# Patient Record
Sex: Female | Born: 1940
Health system: Southern US, Community
[De-identification: ages and names within clinical notes are randomized; demographics above are authoritative.]

## PROBLEM LIST (undated history)

## (undated) DIAGNOSIS — I10 Essential (primary) hypertension: Secondary | ICD-10-CM

## (undated) DIAGNOSIS — E039 Hypothyroidism, unspecified: Secondary | ICD-10-CM

## (undated) DIAGNOSIS — I2510 Atherosclerotic heart disease of native coronary artery without angina pectoris: Secondary | ICD-10-CM

## (undated) HISTORY — PX: CORONARY ANGIOPLASTY: SHX604

## (undated) HISTORY — PX: CARDIAC CATHETERIZATION: SHX172

---

## 2007-01-19 ENCOUNTER — Emergency Department: Payer: Self-pay | Admitting: Emergency Medicine

## 2019-08-03 ENCOUNTER — Encounter (HOSPITAL_COMMUNITY): Payer: Self-pay | Admitting: Internal Medicine

## 2019-08-03 ENCOUNTER — Emergency Department (HOSPITAL_COMMUNITY): Payer: Medicaid Other

## 2019-08-03 ENCOUNTER — Observation Stay (HOSPITAL_COMMUNITY)
Admission: EM | Admit: 2019-08-03 | Discharge: 2019-08-04 | Disposition: A | Discharge status: Home / Self Care | Payer: Medicaid Other | Attending: Internal Medicine | Admitting: Internal Medicine

## 2019-08-03 DIAGNOSIS — M545 Low back pain, unspecified: Secondary | ICD-10-CM | POA: Diagnosis present

## 2019-08-03 DIAGNOSIS — E039 Hypothyroidism, unspecified: Secondary | ICD-10-CM | POA: Insufficient documentation

## 2019-08-03 DIAGNOSIS — Y9301 Activity, walking, marching and hiking: Secondary | ICD-10-CM | POA: Diagnosis not present

## 2019-08-03 DIAGNOSIS — D649 Anemia, unspecified: Secondary | ICD-10-CM | POA: Insufficient documentation

## 2019-08-03 DIAGNOSIS — R200 Anesthesia of skin: Secondary | ICD-10-CM | POA: Diagnosis present

## 2019-08-03 DIAGNOSIS — S22000A Wedge compression fracture of unspecified thoracic vertebra, initial encounter for closed fracture: Secondary | ICD-10-CM | POA: Diagnosis present

## 2019-08-03 DIAGNOSIS — M5126 Other intervertebral disc displacement, lumbar region: Secondary | ICD-10-CM | POA: Diagnosis not present

## 2019-08-03 DIAGNOSIS — G8911 Acute pain due to trauma: Secondary | ICD-10-CM | POA: Insufficient documentation

## 2019-08-03 DIAGNOSIS — I10 Essential (primary) hypertension: Secondary | ICD-10-CM | POA: Diagnosis not present

## 2019-08-03 DIAGNOSIS — Z20822 Contact with and (suspected) exposure to covid-19: Secondary | ICD-10-CM | POA: Diagnosis not present

## 2019-08-03 DIAGNOSIS — M5124 Other intervertebral disc displacement, thoracic region: Secondary | ICD-10-CM | POA: Diagnosis not present

## 2019-08-03 DIAGNOSIS — W19XXXA Unspecified fall, initial encounter: Secondary | ICD-10-CM | POA: Diagnosis not present

## 2019-08-03 DIAGNOSIS — I700 Atherosclerosis of aorta: Secondary | ICD-10-CM | POA: Diagnosis not present

## 2019-08-03 DIAGNOSIS — S22078A Other fracture of T9-T10 vertebra, initial encounter for closed fracture: Secondary | ICD-10-CM | POA: Diagnosis not present

## 2019-08-03 DIAGNOSIS — R739 Hyperglycemia, unspecified: Secondary | ICD-10-CM | POA: Insufficient documentation

## 2019-08-03 DIAGNOSIS — Z955 Presence of coronary angioplasty implant and graft: Secondary | ICD-10-CM | POA: Insufficient documentation

## 2019-08-03 DIAGNOSIS — N189 Chronic kidney disease, unspecified: Secondary | ICD-10-CM | POA: Diagnosis present

## 2019-08-03 DIAGNOSIS — I2510 Atherosclerotic heart disease of native coronary artery without angina pectoris: Secondary | ICD-10-CM | POA: Diagnosis not present

## 2019-08-03 DIAGNOSIS — M48061 Spinal stenosis, lumbar region without neurogenic claudication: Secondary | ICD-10-CM | POA: Diagnosis not present

## 2019-08-03 HISTORY — DX: Hypothyroidism, unspecified: E03.9

## 2019-08-03 HISTORY — DX: Essential (primary) hypertension: I10

## 2019-08-03 HISTORY — DX: Atherosclerotic heart disease of native coronary artery without angina pectoris: I25.10

## 2019-08-03 LAB — COMPREHENSIVE METABOLIC PANEL
ALT: 22 U/L (ref 0–44)
AST: 22 U/L (ref 15–41)
Albumin: 3.7 g/dL (ref 3.5–5.0)
Alkaline Phosphatase: 77 U/L (ref 38–126)
Anion gap: 10 (ref 5–15)
BUN: 20 mg/dL (ref 8–23)
CO2: 25 mmol/L (ref 22–32)
Calcium: 9.4 mg/dL (ref 8.9–10.3)
Chloride: 106 mmol/L (ref 98–111)
Creatinine, Ser: 1.26 mg/dL — ABNORMAL HIGH (ref 0.44–1.00)
GFR calc Af Amer: 47 mL/min — ABNORMAL LOW (ref >60)
GFR calc non Af Amer: 40 mL/min — ABNORMAL LOW (ref >60)
Glucose, Bld: 182 mg/dL — ABNORMAL HIGH (ref 70–99)
Potassium: 3.7 mmol/L (ref 3.5–5.1)
Sodium: 141 mmol/L (ref 135–145)
Total Bilirubin: 0.6 mg/dL (ref 0.3–1.2)
Total Protein: 7.5 g/dL (ref 6.5–8.1)

## 2019-08-03 LAB — CBC
HCT: 33.8 % — ABNORMAL LOW (ref 36.0–46.0)
Hemoglobin: 11 g/dL — ABNORMAL LOW (ref 12.0–15.0)
MCH: 30.6 pg (ref 26.0–34.0)
MCHC: 32.5 g/dL (ref 30.0–36.0)
MCV: 94.2 fL (ref 80.0–100.0)
Platelets: 193 10*3/uL (ref 150–400)
RBC: 3.59 MIL/uL — ABNORMAL LOW (ref 3.87–5.11)
RDW: 11.9 % (ref 11.5–15.5)
WBC: 7.5 10*3/uL (ref 4.0–10.5)
nRBC: 0 % (ref 0.0–0.2)

## 2019-08-03 MED ORDER — LIDOCAINE 5 % EX PTCH
1.0000 | MEDICATED_PATCH | CUTANEOUS | Status: DC
  Administered 2019-08-03: 1 via TRANSDERMAL
  Filled 2019-08-03: qty 1

## 2019-08-03 MED ORDER — ENOXAPARIN SODIUM 40 MG/0.4ML ~~LOC~~ SOLN
40.0000 mg | Freq: Every day | SUBCUTANEOUS | Status: DC
  Administered 2019-08-04: 40 mg via SUBCUTANEOUS
  Filled 2019-08-03: qty 0.4

## 2019-08-03 MED ORDER — ONDANSETRON HCL 4 MG/2ML IJ SOLN: 4.0000 mg | Freq: Four times a day (QID) | INTRAMUSCULAR | Status: DC | PRN

## 2019-08-03 MED ORDER — ONDANSETRON HCL 4 MG PO TABS: 4.0000 mg | ORAL_TABLET | Freq: Four times a day (QID) | ORAL | Status: DC | PRN

## 2019-08-03 MED ORDER — HYDROCODONE-ACETAMINOPHEN 5-325 MG PO TABS
1.0000 | ORAL_TABLET | Freq: Once | ORAL | Status: AC
  Administered 2019-08-03: 1 via ORAL
  Filled 2019-08-03: qty 1

## 2019-08-03 MED ORDER — METHOCARBAMOL 500 MG PO TABS
500.0000 mg | ORAL_TABLET | Freq: Once | ORAL | Status: AC
  Administered 2019-08-03: 500 mg via ORAL
  Filled 2019-08-03: qty 1

## 2019-08-03 MED ORDER — HYDRALAZINE HCL 20 MG/ML IJ SOLN: 10.0000 mg | INTRAMUSCULAR | Status: DC | PRN

## 2019-08-03 NOTE — H&P (Signed)
History and Physical    Danielle Houston QMV:[id number] DOB: 1941-01-20 DOA: 08/03/2019  PCP: Patient, No Pcp Per  Patient coming from: Home.  Patient speaks [place] language of [place].  Translator used.  Chief Complaint: Low back pain.  HPI: Danielle Houston is a 79 y.o. female with history of CAD hypertension hypothyroidism had a fall at home when she was walking and slipped and fell down to the floor and hit the back.  In the process she did hit her head did not lose consciousness.  Since then she has been having low back pain.  Denies any incontinence of urine or difficulty moving extremities.  ED Course: In the ER patient was able to move all extremities but since patient was complaining significant pain in the low back MRI was done of the L-spine and T-spine which shows some mild canal stenosis.  Patient admitted for further observation.  Labs show hemoglobin of 11 no old labs to compare blood glucose 182.  Creatinine 1.2.  Medication list has to be updated.  Covid test pending.  Review of Systems: As per HPI, rest all negative.   Past Medical History:  Diagnosis Date  . Coronary artery disease   . Hypertension   . Hypothyroidism     Past Surgical History:  Procedure Laterality Date  . CARDIAC CATHETERIZATION    . CORONARY ANGIOPLASTY       reports that she has never smoked. She has never used smokeless tobacco. She reports that she does not drink alcohol or use drugs.  Not on File  Family History  Problem Relation Age of Onset  . CAD Neg Hx     Prior to Admission medications   Not on File    Physical Exam: Constitutional: Moderately built and nourished. Vitals:   08/03/19 1126 08/03/19 1944  BP: (!) 166/69 (!) 190/72  Pulse: 68 69  Resp: 14 15  Temp: 98.4 F (36.9 C)   TempSrc: Oral   SpO2: 100% 98%   Eyes: Anicteric no pallor. ENMT: No discharge from the ears eyes nose or mouth. Neck: No mass.  No neck rigidity. Respiratory: No rhonchi or  crepitations. Cardiovascular: S1-S2 heard. Abdomen: Soft nontender bowel sounds present. Musculoskeletal: No edema. Skin: No rash. Neurologic: Alert awake oriented to time place and person.  Moves all extremities. Psychiatric: Appears normal but normal affect.   Labs on Admission: I have personally reviewed following labs and imaging studies  CBC: Recent Labs  Lab 08/03/19 1950  WBC 7.5  HGB 11.0*  HCT 33.8*  MCV 94.2  PLT 284   Basic Metabolic Panel: Recent Labs  Lab 08/03/19 1950  NA 141  K 3.7  CL 106  CO2 25  GLUCOSE 182*  BUN 20  CREATININE 1.26*  CALCIUM 9.4   GFR: CrCl cannot be calculated (Unknown ideal weight.). Liver Function Tests: Recent Labs  Lab 08/03/19 1950  AST 22  ALT 22  ALKPHOS 77  BILITOT 0.6  PROT 7.5  ALBUMIN 3.7   No results for input(s): LIPASE, AMYLASE in the last 168 hours. No results for input(s): AMMONIA in the last 168 hours. Coagulation Profile: No results for input(s): INR, PROTIME in the last 168 hours. Cardiac Enzymes: No results for input(s): CKTOTAL, CKMB, CKMBINDEX, TROPONINI in the last 168 hours. BNP (last 3 results) No results for input(s): PROBNP in the last 8760 hours. HbA1C: No results for input(s): HGBA1C in the last 72 hours. CBG: No results for input(s): GLUCAP in the last 168 hours. Lipid  Profile: No results for input(s): CHOL, HDL, LDLCALC, TRIG, CHOLHDL, LDLDIRECT in the last 72 hours. Thyroid Function Tests: No results for input(s): TSH, T4TOTAL, FREET4, T3FREE, THYROIDAB in the last 72 hours. Anemia Panel: No results for input(s): VITAMINB12, FOLATE, FERRITIN, TIBC, IRON, RETICCTPCT in the last 72 hours. Urine analysis: No results found for: COLORURINE, APPEARANCEUR, LABSPEC, PHURINE, GLUCOSEU, HGBUR, BILIRUBINUR, KETONESUR, PROTEINUR, UROBILINOGEN, NITRITE, LEUKOCYTESUR Sepsis Labs: @LABRCNTIP (procalcitonin:4,lacticidven:4) )No results found for this or any previous visit (from the past 240  hour(s)).   Radiological Exams on Admission: DG Thoracic Spine 2 View  Result Date: 08/03/2019 CLINICAL DATA:  Mid and low back pain since a fall 08/01/2019. Initial encounter. EXAM: THORACIC SPINE 2 VIEWS COMPARISON:  Plain films lumbar spine this same day. FINDINGS: T9 compression fracture is seen as on comparison plain films of the lumbar spine today. No other fracture is identified. No worrisome bony lesion. Atherosclerosis noted. IMPRESSION: Age-indeterminate T9 compression fracture as seen on plain films of the lumbar spine this same day. No other fracture is identified. Aortic Atherosclerosis (ICD10-I70.0). Electronically Signed   By: Inge Rise M.D.   On: 08/03/2019 12:40   DG Lumbar Spine Complete  Result Date: 08/03/2019 CLINICAL DATA:  Mid and low back pain since a fall 08/01/2019. Initial encounter. EXAM: LUMBAR SPINE - COMPLETE 4+ VIEW COMPARISON:  None. FINDINGS: The patient has a biconcave compression fracture of T9 with vertebral body height loss of up to approximately 50%. The fracture is age indeterminate. No bony retropulsion is identified. No other fracture is seen. Intervertebral disc space height is maintained throughout. Paraspinous structures are unremarkable. IMPRESSION: T9 compression fracture with vertebral body height loss of up to 50% is age indeterminate. The exam is otherwise negative. Electronically Signed   By: Inge Rise M.D.   On: 08/03/2019 12:39   MR THORACIC SPINE WO CONTRAST  Result Date: 08/03/2019 CLINICAL DATA:  Initial evaluation for acute trauma, fall, compression fracture. EXAM: MRI THORACIC AND LUMBAR SPINE WITHOUT CONTRAST TECHNIQUE: Multiplanar and multiecho pulse sequences of the thoracic and lumbar spine were obtained without intravenous contrast. COMPARISON:  Prior radiograph from earlier the same day. FINDINGS: MRI THORACIC SPINE FINDINGS Alignment: Trace scoliosis. Alignment otherwise normal with preservation of the normal thoracic  kyphosis. No listhesis. Vertebrae: Acute to subacute compression fracture involving the T9 vertebral body with up to 35% height loss without bony retropulsion. This is benign/mechanical in appearance. Otherwise, vertebral body height maintained without evidence for acute or chronic fracture. Bone marrow signal intensity within normal limits. Few scattered benign hemangiomata noted. No worrisome osseous lesions. No other abnormal marrow edema. Cord:  Signal intensity within the thoracic spinal cord is normal. Paraspinal and other soft tissues: Mild paraspinous edema adjacent to the T9 compression fracture. Paraspinous soft tissues otherwise within normal limits. Partially visualized lungs are clear. Visualized visceral structures unremarkable. Disc levels: T1-2:  Unremarkable. T2-3: Normal interspace. Right-sided facet hypertrophy. No stenosis. T3-4: Normal interspace. Right-sided facet hypertrophy. No stenosis. T4-5: Minimal disc bulge. Right greater than left facet hypertrophy. No stenosis. T5-6: Right paracentral disc protrusion indents the right ventral thecal sac. No significant spinal stenosis or cord deformity. Foramina remain patent. T6-7: Left paracentral disc protrusion indents the left ventral thecal sac. No significant stenosis or cord deformity. Foramina remain patent. T7-8: Negative interspace. Posterior element hypertrophy. No stenosis. T8-9: Left paracentral disc protrusion indents the left ventral thecal sac. Minimal cord flattening without significant spinal stenosis. Superimposed posterior element hypertrophy. Foramina remain patent. T9-10: Central disc protrusion indents the ventral  thecal sac. No significant spinal stenosis or cord deformity. Foramina remain patent. T10-11:  Negative interspace. Mild facet hypertrophy. No stenosis. T11-12: Disc desiccation with minimal disc bulge. Mild facet hypertrophy. No stenosis. T12-L1:  Unremarkable. MRI LUMBAR SPINE FINDINGS Segmentation: Standard. Lowest  well-formed disc space labeled the L5-S1 level. Alignment: Mild straightening of the normal lumbar lordosis. Trace 2 mm anterolisthesis of L4 on L5, chronic and facet mediated. Vertebrae: Vertebral body height maintained without evidence for acute or chronic fracture. Bone marrow signal intensity within normal limits. No discrete or worrisome osseous lesions. No abnormal marrow edema. Conus medullaris and cauda equina: Conus extends to the L1-2 level. Conus and cauda equina appear normal. Paraspinal and other soft tissues: Paraspinous soft tissues within normal limits. Partially visualized urinary bladder moderately distended. Visualized visceral structures otherwise unremarkable. Disc levels: L1-2: Disc desiccation with mild disc bulge. Superimposed small central disc protrusion mildly indents the ventral thecal sac. No significant spinal stenosis. Foramina remain patent. L2-3:  Unremarkable. L3-4: Disc desiccation with mild disc bulge. Anterior annular fissure noted. Superimposed shallow left foraminal disc protrusion closely approximates the exiting left L3 nerve root (series 7, image 24). Mild facet hypertrophy. No significant spinal stenosis. Mild left greater than right L3 foraminal narrowing. L4-5: Trace anterolisthesis. Disc desiccation with mild disc bulge. Prominent anterior annular fissure. Superimposed central disc protrusion with associated superior migration. Mild bilateral facet hypertrophy. Resultant mild canal with left greater than right lateral recess stenosis. Mild left greater than right L4 foraminal narrowing. L5-S1: Disc desiccation with mild disc bulge. Mild bilateral facet hypertrophy. No significant spinal stenosis. Lateral recesses remain patent. No significant foraminal narrowing. IMPRESSION: MR THORACIC SPINE IMPRESSION 1. Acute to subacute compression fracture involving the T9 vertebral body with up to 35% height loss without bony retropulsion. No stenosis. 2. Multilevel degenerative  spondylosis with small disc protrusions at T5-6, T6-7, T8-9, and T9-10 as above. No significant stenosis. MR LUMBAR SPINE IMPRESSION 1. No acute traumatic injury within the lumbar spine. 2. Disc bulge with small central disc protrusion and facet hypertrophy at L4-5 with resultant mild canal with mild bilateral lateral recess stenosis, with mild bilateral L4 foraminal narrowing, greater on the left. 3. Shallow left foraminal disc protrusion at L3-4, closely approximating and potentially affecting the exiting left L3 nerve root. Electronically Signed   By: Jeannine Boga M.D.   On: 08/03/2019 21:38   MR LUMBAR SPINE WO CONTRAST  Result Date: 08/03/2019 CLINICAL DATA:  Initial evaluation for acute trauma, fall, compression fracture. EXAM: MRI THORACIC AND LUMBAR SPINE WITHOUT CONTRAST TECHNIQUE: Multiplanar and multiecho pulse sequences of the thoracic and lumbar spine were obtained without intravenous contrast. COMPARISON:  Prior radiograph from earlier the same day. FINDINGS: MRI THORACIC SPINE FINDINGS Alignment: Trace scoliosis. Alignment otherwise normal with preservation of the normal thoracic kyphosis. No listhesis. Vertebrae: Acute to subacute compression fracture involving the T9 vertebral body with up to 35% height loss without bony retropulsion. This is benign/mechanical in appearance. Otherwise, vertebral body height maintained without evidence for acute or chronic fracture. Bone marrow signal intensity within normal limits. Few scattered benign hemangiomata noted. No worrisome osseous lesions. No other abnormal marrow edema. Cord:  Signal intensity within the thoracic spinal cord is normal. Paraspinal and other soft tissues: Mild paraspinous edema adjacent to the T9 compression fracture. Paraspinous soft tissues otherwise within normal limits. Partially visualized lungs are clear. Visualized visceral structures unremarkable. Disc levels: T1-2:  Unremarkable. T2-3: Normal interspace. Right-sided  facet hypertrophy. No stenosis. T3-4: Normal interspace. Right-sided facet  hypertrophy. No stenosis. T4-5: Minimal disc bulge. Right greater than left facet hypertrophy. No stenosis. T5-6: Right paracentral disc protrusion indents the right ventral thecal sac. No significant spinal stenosis or cord deformity. Foramina remain patent. T6-7: Left paracentral disc protrusion indents the left ventral thecal sac. No significant stenosis or cord deformity. Foramina remain patent. T7-8: Negative interspace. Posterior element hypertrophy. No stenosis. T8-9: Left paracentral disc protrusion indents the left ventral thecal sac. Minimal cord flattening without significant spinal stenosis. Superimposed posterior element hypertrophy. Foramina remain patent. T9-10: Central disc protrusion indents the ventral thecal sac. No significant spinal stenosis or cord deformity. Foramina remain patent. T10-11:  Negative interspace. Mild facet hypertrophy. No stenosis. T11-12: Disc desiccation with minimal disc bulge. Mild facet hypertrophy. No stenosis. T12-L1:  Unremarkable. MRI LUMBAR SPINE FINDINGS Segmentation: Standard. Lowest well-formed disc space labeled the L5-S1 level. Alignment: Mild straightening of the normal lumbar lordosis. Trace 2 mm anterolisthesis of L4 on L5, chronic and facet mediated. Vertebrae: Vertebral body height maintained without evidence for acute or chronic fracture. Bone marrow signal intensity within normal limits. No discrete or worrisome osseous lesions. No abnormal marrow edema. Conus medullaris and cauda equina: Conus extends to the L1-2 level. Conus and cauda equina appear normal. Paraspinal and other soft tissues: Paraspinous soft tissues within normal limits. Partially visualized urinary bladder moderately distended. Visualized visceral structures otherwise unremarkable. Disc levels: L1-2: Disc desiccation with mild disc bulge. Superimposed small central disc protrusion mildly indents the ventral thecal  sac. No significant spinal stenosis. Foramina remain patent. L2-3:  Unremarkable. L3-4: Disc desiccation with mild disc bulge. Anterior annular fissure noted. Superimposed shallow left foraminal disc protrusion closely approximates the exiting left L3 nerve root (series 7, image 24). Mild facet hypertrophy. No significant spinal stenosis. Mild left greater than right L3 foraminal narrowing. L4-5: Trace anterolisthesis. Disc desiccation with mild disc bulge. Prominent anterior annular fissure. Superimposed central disc protrusion with associated superior migration. Mild bilateral facet hypertrophy. Resultant mild canal with left greater than right lateral recess stenosis. Mild left greater than right L4 foraminal narrowing. L5-S1: Disc desiccation with mild disc bulge. Mild bilateral facet hypertrophy. No significant spinal stenosis. Lateral recesses remain patent. No significant foraminal narrowing. IMPRESSION: MR THORACIC SPINE IMPRESSION 1. Acute to subacute compression fracture involving the T9 vertebral body with up to 35% height loss without bony retropulsion. No stenosis. 2. 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 No significant stenosis. MR LUMBAR SPINE IMPRESSION 1. No acute traumatic injury within the lumbar spine. 2. Disc bulge with small central disc protrusion and facet hypertrophy at L4-5 with resultant mild canal with mild bilateral lateral recess stenosis, with mild bilateral L4 foraminal narrowing, greater on the left. 3. Shallow left foraminal disc protrusion at L3-4, closely approximating and potentially affecting the exiting left L3 nerve root. Electronically Signed   By: Jeannine Boga M.D.   On: 08/03/2019 21:38     Assessment/Plan Principal Problem:   Low back pain Active Problems:   CAD (coronary artery disease)   Anemia   Hypothyroidism    1. Low back pain after fall MRI of the T-spine L-spine does show mild  canal stenosis of the lumbar area.  Will get physical therapy consult. 2. History of CAD hypertension and hypothyroidism medication list is not available at this time.  Will need to continue once available.  For now I have placed patient on as needed IV hydralazine for blood pressure. 3. Normocytic normochromic anemia no old  labs to compare. 4. Hyperglycemia check hemoglobin A1c with next blood draw.  Covid test is pending.   DVT prophylaxis: Lovenox. Code Status: Full code. Family Communication: Patient's son was in the ER earlier with whom ER physician discussed. Disposition Plan: Home. Consults called: Physical therapy. Admission status: Observation.   Rise Patience MD [facility] Pager [phone].  If 7PM-7AM, please contact night-coverage [email or web address] Password [facility]  08/03/2019, 11:54 PM

## 2019-08-03 NOTE — ED Notes (Signed)
Pt transported to MRI 

## 2019-08-03 NOTE — ED Triage Notes (Signed)
Pt here from home with c/o mid and lower back pain after a fall she had on Monday night , did not hit her head , no loc

## 2019-08-03 NOTE — ED Provider Notes (Signed)
[facility] Emergency Department Provider Note MRN:  [id number]  Arrival date & time: 08/03/19     Chief Complaint   Back pain History of Present Illness   Danielle Houston is a 79 y.o. year-old female with a history of hypothyroidism, CAD presenting to the ED with chief complaint of back pain.  History obtained from patient's son via Mandarin interpreter.  Patient was sitting on the edge of the bed and slipped off and fell to the ground.  No head trauma, no loss of consciousness.  Has been having back pain since the fall.  Was walking the day of the injury but yesterday began having great deal ambulating due to the pain.  Also endorsing decreased sensation to the left leg.  Denies weakness, no bowel or bladder dysfunction, no other complaints.  Pain is moderate to severe, constant, worse with motion or palpation.  Review of Systems  A complete 10 system review of systems was obtained and all systems are negative except as noted in the HPI and PMH.   Patient's Health History    Past medical history: Hypertension, hypothyroidism, CAD status post stent placement Social history: Non-smoker   No family history on file.  Social History   Socioeconomic History  . Marital status: Single    Spouse name: Not on file  . Number of children: Not on file  . Years of education: Not on file  . Highest education level: Not on file  Occupational History  . Not on file  Tobacco Use  . Smoking status: Not on file  Substance and Sexual Activity  . Alcohol use: Not on file  . Drug use: Not on file  . Sexual activity: Not on file  Other Topics Concern  . Not on file  Social History Narrative  . Not on file   Social Determinants of Health   Financial Resource Strain:   . Difficulty of Paying Living Expenses:   Food Insecurity:   . Worried About Charity fundraiser in the Last Year:   . Arboriculturist in the Last Year:   Transportation Needs:   . Film/video editor  (Medical):   Marland Kitchen Lack of Transportation (Non-Medical):   Physical Activity:   . Days of Exercise per Week:   . Minutes of Exercise per Session:   Stress:   . Feeling of Stress :   Social Connections:   . Frequency of Communication with Friends and Family:   . Frequency of Social Gatherings with Friends and Family:   . Attends Religious Services:   . Active Member of Clubs or Organizations:   . Attends Archivist Meetings:   Marland Kitchen Marital Status:   Intimate Partner Violence:   . Fear of Current or Ex-Partner:   . Emotionally Abused:   Marland Kitchen Physically Abused:   . Sexually Abused:      Physical Exam   Vitals:   08/03/19 1126 08/03/19 1944  BP: (!) 166/69 (!) 190/72  Pulse: 68 69  Resp: 14 15  Temp: 98.4 F (36.9 C)   SpO2: 100% 98%    CONSTITUTIONAL: Well-appearing, NAD NEURO:  Alert and oriented x 3, normal and symmetric strength, subjective decreased sensation to left leg EYES:  eyes equal and reactive ENT/NECK:  no LAD, no JVD CARDIO: Regular rate, well-perfused, normal S1 and S2 PULM:  CTAB no wheezing or rhonchi GI/GU:  normal bowel sounds, non-distended, non-tender MSK/SPINE:  No gross deformities, no edema SKIN:  no rash, atraumatic  PSYCH:  Appropriate speech and behavior  *Additional and/or pertinent findings included in MDM below  Diagnostic and Interventional Summary    EKG Interpretation  Date/Time:    Ventricular Rate:    PR Interval:    QRS Duration:   QT Interval:    QTC Calculation:   R Axis:     Text Interpretation:        Labs Reviewed  CBC - Abnormal; Notable for the following components:      Result Value   RBC 3.59 (*)    Hemoglobin 11.0 (*)    HCT 33.8 (*)    All other components within normal limits  COMPREHENSIVE METABOLIC PANEL - Abnormal; Notable for the following components:   Glucose, Bld 182 (*)    Creatinine, Ser 1.26 (*)    GFR calc non Af Amer 40 (*)    GFR calc Af Amer 47 (*)    All other components within normal  limits  SARS CORONAVIRUS 2 BY RT PCR (HOSPITAL ORDER, [facility])    MR LUMBAR SPINE WO CONTRAST  Final Result    MR THORACIC SPINE WO CONTRAST  Final Result    DG Lumbar Spine Complete  Final Result    DG Thoracic Spine 2 View  Final Result      Medications  lidocaine (LIDODERM) 5 % 1 patch (has no administration in time range)  methocarbamol (ROBAXIN) tablet 500 mg (has no administration in time range)  HYDROcodone-acetaminophen (NORCO/VICODIN) 5-325 MG per tablet 1 tablet (1 tablet Oral Given 08/03/19 1940)     Procedures  /  Critical Care Procedures  ED Course and Medical Decision Making  I have reviewed the triage vital signs, the nursing notes, and pertinent available records from the EMR.  Listed above are laboratory and imaging tests that I personally ordered, reviewed, and interpreted and then considered in my medical decision making (see below for details).      X-rays reveal compression fracture with 50% height loss, age indeterminant.  Given that this injury location correlates with the location of the patient's pain and the possible neurological deficit, will obtain MRI imaging to exclude retropulsion or myelopathy.  MRI confirms acute or subacute compression fracture, also suggestive of L4 nerve root compression on the left which would explain the numbness.  Patient continues to have a lot of pain and is unable to walk, would not have very much support at home.  Also considering the fact that she is not established with a doctor in the area, all of her home medications at bedside are in Mandarin, son explains that they would have no way of calling an office number in speaking to the secretary in [place], they do not know anybody that speaks English that could do this for them.  For pain control and helping to establish primary care follow-up and spinal specialist follow-up, will admit to hospitalist service.  Barth Kirks. Sedonia Small, MD [place] [email or web address]  Final Clinical Impressions(s) / ED Diagnoses     ICD-10-CM   1. Compression fracture of body of thoracic vertebra (HCC)  S22.000A   2. Numbness in left leg  R20.0     ED Discharge Orders    None       Discharge Instructions Discussed with and Provided to Patient:   Discharge Instructions   None       Maudie Flakes, MD 08/03/19 2227

## 2019-08-04 DIAGNOSIS — M545 Low back pain: Secondary | ICD-10-CM | POA: Diagnosis not present

## 2019-08-04 LAB — IRON AND TIBC
Iron: 47 ug/dL (ref 28–170)
Saturation Ratios: 18 % (ref 10.4–31.8)
TIBC: 265 ug/dL (ref 250–450)
UIBC: 218 ug/dL

## 2019-08-04 LAB — RETICULOCYTES
Immature Retic Fract: 7.9 % (ref 2.3–15.9)
RBC.: 3.41 MIL/uL — ABNORMAL LOW (ref 3.87–5.11)
Retic Count, Absolute: 44 10*3/uL (ref 19.0–186.0)
Retic Ct Pct: 1.3 % (ref 0.4–3.1)

## 2019-08-04 LAB — CBC
HCT: 32.4 % — ABNORMAL LOW (ref 36.0–46.0)
Hemoglobin: 10.8 g/dL — ABNORMAL LOW (ref 12.0–15.0)
MCH: 31.3 pg (ref 26.0–34.0)
MCHC: 33.3 g/dL (ref 30.0–36.0)
MCV: 93.9 fL (ref 80.0–100.0)
Platelets: 191 10*3/uL (ref 150–400)
RBC: 3.45 MIL/uL — ABNORMAL LOW (ref 3.87–5.11)
RDW: 11.9 % (ref 11.5–15.5)
WBC: 7.4 10*3/uL (ref 4.0–10.5)
nRBC: 0 % (ref 0.0–0.2)

## 2019-08-04 LAB — BASIC METABOLIC PANEL
Anion gap: 8 (ref 5–15)
BUN: 17 mg/dL (ref 8–23)
CO2: 28 mmol/L (ref 22–32)
Calcium: 9.1 mg/dL (ref 8.9–10.3)
Chloride: 106 mmol/L (ref 98–111)
Creatinine, Ser: 1.16 mg/dL — ABNORMAL HIGH (ref 0.44–1.00)
GFR calc Af Amer: 52 mL/min — ABNORMAL LOW (ref >60)
GFR calc non Af Amer: 45 mL/min — ABNORMAL LOW (ref >60)
Glucose, Bld: 103 mg/dL — ABNORMAL HIGH (ref 70–99)
Potassium: 4 mmol/L (ref 3.5–5.1)
Sodium: 142 mmol/L (ref 135–145)

## 2019-08-04 LAB — HEMOGLOBIN A1C
Hgb A1c MFr Bld: 5.9 % — ABNORMAL HIGH (ref 4.8–5.6)
Mean Plasma Glucose: 122.63 mg/dL

## 2019-08-04 LAB — FERRITIN: Ferritin: 135 ng/mL (ref 11–307)

## 2019-08-04 LAB — VITAMIN B12: Vitamin B-12: 280 pg/mL (ref 180–914)

## 2019-08-04 LAB — FOLATE: Folate: 36 ng/mL (ref >5.9)

## 2019-08-04 MED ORDER — ACETAMINOPHEN 500 MG PO TABS: 1000.0000 mg | ORAL_TABLET | Freq: Three times a day (TID) | ORAL | Status: DC

## 2019-08-04 MED ORDER — ACETAMINOPHEN 500 MG PO TABS: 1000.0000 mg | ORAL_TABLET | Freq: Three times a day (TID) | ORAL | 0 refills | Status: AC

## 2019-08-04 MED ORDER — DICLOFENAC SODIUM 1 % EX GEL
2.0000 g | Freq: Four times a day (QID) | CUTANEOUS | Status: DC
  Administered 2019-08-04 (×2): 2 g via TOPICAL
  Filled 2019-08-04: qty 100

## 2019-08-04 MED ORDER — DICLOFENAC SODIUM 1 % EX GEL: 2.0000 g | Freq: Four times a day (QID) | CUTANEOUS | 0 refills | Status: AC

## 2019-08-04 MED ORDER — HYDRALAZINE HCL 25 MG PO TABS: 25.0000 mg | ORAL_TABLET | Freq: Three times a day (TID) | ORAL | 0 refills | Status: DC

## 2019-08-04 MED ORDER — METHOCARBAMOL 500 MG PO TABS
500.0000 mg | ORAL_TABLET | Freq: Three times a day (TID) | ORAL | Status: DC
  Administered 2019-08-04: 500 mg via ORAL
  Filled 2019-08-04: qty 1

## 2019-08-04 MED ORDER — ACETAMINOPHEN 500 MG PO TABS
1000.0000 mg | ORAL_TABLET | Freq: Three times a day (TID) | ORAL | Status: DC
  Administered 2019-08-04: 1000 mg via ORAL
  Filled 2019-08-04: qty 2

## 2019-08-04 NOTE — Progress Notes (Signed)
Patient has been up to the bathroom with nursing.  MRIs re-assuring.  Pain controlled.  Hope for d/c later today. JV

## 2019-08-04 NOTE — Evaluation (Signed)
Physical Therapy Evaluation Patient Details Name: Danielle Houston MRN: [id number] DOB: March 30, 1940 Today's Date: 08/04/2019   History of Present Illness  Pt is a 79 yo female presenting after a fall at home where she hit her back and head but did not lose consciousness. Upon arrival, imaging reveales acute or subacute compression fx of L4 with nerve root compression. PMH in cludes: CAD, and HTN.  Clinical Impression  Pt OOB ambulating in room upon arrival of PT, although the pt ambulates slowly, she demonstrates good functional stability with ambulation and transfers throughout session. Despite multiple attempts at interpreter use, I was unable to find an interpreter that speaks the pt's dialect, so we do not know much about PLF or home set up. I discussed back precautions for comfort with the pt's son and feel that returning home with supervision and assist of family will be the safest d/c plan for the pt. The pt's family are in agreement and report the pt seems to be moving at her baseline level of function. No acute PT needs identified at this time, all education was completed with the family. Thank you for the consult.     Follow Up Recommendations No PT follow up;Supervision/Assistance - 24 hour    Equipment Recommendations  None recommended by PT    Recommendations for Other Services       Precautions / Restrictions Precautions Precautions: Fall;Back Precaution Booklet Issued: No Precaution Comments: back for comfort, pt does not speak english, attempted to demonstrate precautions Restrictions Weight Bearing Restrictions: No      Mobility  Bed Mobility Overal bed mobility: Independent             General bed mobility comments: pt OOB walking in room upon arrival of PT, able to get herself into/out of bed  Transfers Overall transfer level: Independent               General transfer comment: pt OOB walking in room upon arrival of PT, able to stand/sit from [facility] without  assist or cues, able to sit to return to bed without assist or instabiltiy  Ambulation/Gait Ambulation/Gait assistance: Supervision Gait Distance (Feet): 15 Feet Assistive device: None Gait Pattern/deviations: WFL(Within Functional Limits)   Gait velocity interpretation: <1.31 ft/sec, indicative of household ambulator General Gait Details: pt with slowed gait, but functional, good stability  Stairs            Wheelchair Mobility    Modified Rankin (Stroke Patients Only)       Balance Overall balance assessment: No apparent balance deficits (not formally assessed)                                           Pertinent Vitals/Pain Pain Assessment: Faces Faces Pain Scale: Hurts a little bit Pain Location: back Pain Descriptors / Indicators: Grimacing;Sore Pain Intervention(s): Limited activity within patient's tolerance;Repositioned    Home Living Family/patient expects to be discharged to:: Unsure                 Additional Comments: Pt from home with family who can assist as needed, pt does not speak [place] or any languages offered on interpreter service. After brief discussion with son, pt is able to live on 1st floor, has 3 STE, but has not have difficulty with steps PTA.    Prior Function Level of Independence: Needs assistance   Gait / Transfers  Assistance Needed: son provides some supervision, reports she walks "slowly"  ADL's / Homemaking Assistance Needed: family assists with IADLS, pt compeltes ADLs        Hand Dominance        Extremity/Trunk Assessment   Upper Extremity Assessment Upper Extremity Assessment: Overall WFL for tasks assessed    Lower Extremity Assessment Lower Extremity Assessment: Overall WFL for tasks assessed    Cervical / Trunk Assessment Cervical / Trunk Assessment: Normal  Communication   Communication: Prefers language other than English  Cognition Arousal/Alertness: Awake/alert Behavior During  Therapy: WFL for tasks assessed/performed Overall Cognitive Status: Difficult to assess                                 General Comments: Attempted translator, per translator, pt does not speak dialect noted in chart      General Comments General comments (skin integrity, edema, etc.): trialed interpreter Danielle Houston [phone] and Danielle Houston [phone], but both reported that the language the pt was speaking was not a dialect they knew. VSS    Exercises     Assessment/Plan    PT Assessment Patent does not need any further PT services  PT Problem List Decreased balance;Decreased mobility       PT Treatment Interventions DME instruction;Gait training;Functional mobility training;Therapeutic activities;Therapeutic exercise;Patient/family education;Balance training    PT Goals (Current goals can be found in the Care Plan section)  Acute Rehab PT Goals Patient Stated Goal: none stated PT Goal Formulation: Patient unable to participate in goal setting Time For Goal Achievement: 08/18/19 Potential to Achieve Goals: Good    Frequency     Barriers to discharge        Co-evaluation               AM-PAC PT "6 Clicks" Mobility  Outcome Measure Help needed turning from your back to your side while in a flat bed without using bedrails?: None Help needed moving from lying on your back to sitting on the side of a flat bed without using bedrails?: None   Help needed standing up from a chair using your arms (e.g., wheelchair or bedside chair)?: None Help needed to walk in hospital room?: None Help needed climbing 3-5 steps with a railing? : A Little 6 Click Score: 19    End of Session   Activity Tolerance: Patient tolerated treatment well Patient left: in bed;with call bell/phone within reach;with bed alarm set Nurse Communication: Mobility status PT Visit Diagnosis: History of falling (Z91.81);Difficulty in walking, not elsewhere classified (R26.2);Pain Pain - Right/Left:  (central) Pain - part of body: (back)    Time: 8144-8185 PT Time Calculation (min) (ACUTE ONLY): 20 min   Charges:   PT Evaluation $PT Eval Moderate Complexity: 1 Mod          Karma Ganja, PT, DPT   Acute Rehabilitation Department Pager #: [phone]   Otho Bellows 08/04/2019, 11:53 AM

## 2019-08-04 NOTE — Discharge Summary (Addendum)
Physician Discharge Summary  Danielle Houston JOI:[id number] DOB: 1940/08/28 DOA: 08/03/2019  PCP: Patient, No Pcp Per  Admit date: 08/03/2019 Discharge date: 08/04/2019  Admitted From: Home Discharge disposition: Home   Recommendations for Outpatient Follow-Up:   1. CBC with next office visit 2. Blood pressure check once established with PCP   Discharge Diagnosis:   Principal Problem:   Low back pain Active Problems:   CAD (coronary artery disease)   Anemia   Hypothyroidism    Discharge Condition: Improved.  Diet recommendation: Low sodium, heart healthy  Wound care: None.  Code status: Full.   History of Present Illness:   Danielle Houston is a 79 y.o. female with history of CAD hypertension hypothyroidism had a fall at home when she was walking and slipped and fell down to the floor and hit the back.  In the process she did hit her head did not lose consciousness.  Since then she has been having low back pain.  Denies any incontinence of urine or difficulty moving extremities.  ED Course: In the ER patient was able to move all extremities but since patient was complaining significant pain in the low back MRI was done of the L-spine and T-spine which shows some mild canal stenosis.  Patient admitted for further observation.  Labs show hemoglobin of 11 no old labs to compare blood glucose 182.  Creatinine 1.2.  Medication list has to be updated.  Covid test pending.    Hospital Course by Problem:   Low back pain -Status post fall -MRI of thoracic and lumbar spine does show mild canal stenosis but no alarming findings PT consult appreciated and patient does not need follow-up  Normocytic anemia -Outpatient follow-up with PCP  Hyperglycemia Hemoglobin A1c 5.9  HTN -start hydralazine when runs out of her current meds (unknown-- [place] writing)  Medical Consultants:   None   Discharge Exam:   Vitals:   08/04/19 0327 08/04/19 1000  BP: (!) 160/67 (!)  155/80  Pulse: (!) 59 61  Resp: 18 17  Temp: 98.4 F (36.9 C) 98.5 F (36.9 C)  SpO2: 98% 99%   Vitals:   08/04/19 0212 08/04/19 0327 08/04/19 1000 08/04/19 1100  BP: (!) 158/71 (!) 160/67 (!) 155/80   Pulse: 63 (!) 59 61   Resp: 14 18 17    Temp: 98.2 F (36.8 C) 98.4 F (36.9 C) 98.5 F (36.9 C)   TempSrc: Oral Oral Oral   SpO2: 99% 98% 99%   Weight:    56.7 kg  Height:    5\' 2"  (1.575 m)    General exam: Appears calm and comfortable.   The results of significant diagnostics from this hospitalization (including imaging, microbiology, ancillary and laboratory) are listed below for reference.     Procedures and Diagnostic Studies:   DG Thoracic Spine 2 View  Result Date: 08/03/2019 CLINICAL DATA:  Mid and low back pain since a fall 08/01/2019. Initial encounter. EXAM: THORACIC SPINE 2 VIEWS COMPARISON:  Plain films lumbar spine this same day. FINDINGS: T9 compression fracture is seen as on comparison plain films of the lumbar spine today. No other fracture is identified. No worrisome bony lesion. Atherosclerosis noted. IMPRESSION: Age-indeterminate T9 compression fracture as seen on plain films of the lumbar spine this same day. No other fracture is identified. Aortic Atherosclerosis (ICD10-I70.0). Electronically Signed   By: Inge Rise M.D.   On: 08/03/2019 12:40   DG Lumbar Spine Complete  Result Date: 08/03/2019 CLINICAL DATA:  Mid and low back pain since a fall 08/01/2019. Initial encounter. EXAM: LUMBAR SPINE - COMPLETE 4+ VIEW COMPARISON:  None. FINDINGS: The patient has a biconcave compression fracture of T9 with vertebral body height loss of up to approximately 50%. The fracture is age indeterminate. No bony retropulsion is identified. No other fracture is seen. Intervertebral disc space height is maintained throughout. Paraspinous structures are unremarkable. IMPRESSION: T9 compression fracture with vertebral body height loss of up to 50% is age indeterminate. The  exam is otherwise negative. Electronically Signed   By: Inge Rise M.D.   On: 08/03/2019 12:39   MR THORACIC SPINE WO CONTRAST  Result Date: 08/03/2019 CLINICAL DATA:  Initial evaluation for acute trauma, fall, compression fracture. EXAM: MRI THORACIC AND LUMBAR SPINE WITHOUT CONTRAST TECHNIQUE: Multiplanar and multiecho pulse sequences of the thoracic and lumbar spine were obtained without intravenous contrast. COMPARISON:  Prior radiograph from earlier the same day. FINDINGS: MRI THORACIC SPINE FINDINGS Alignment: Trace scoliosis. Alignment otherwise normal with preservation of the normal thoracic kyphosis. No listhesis. Vertebrae: Acute to subacute compression fracture involving the T9 vertebral body with up to 35% height loss without bony retropulsion. This is benign/mechanical in appearance. Otherwise, vertebral body height maintained without evidence for acute or chronic fracture. Bone marrow signal intensity within normal limits. Few scattered benign hemangiomata noted. No worrisome osseous lesions. No other abnormal marrow edema. Cord:  Signal intensity within the thoracic spinal cord is normal. Paraspinal and other soft tissues: Mild paraspinous edema adjacent to the T9 compression fracture. Paraspinous soft tissues otherwise within normal limits. Partially visualized lungs are clear. Visualized visceral structures unremarkable. Disc levels: T1-2:  Unremarkable. T2-3: Normal interspace. Right-sided facet hypertrophy. No stenosis. T3-4: Normal interspace. Right-sided facet hypertrophy. No stenosis. T4-5: Minimal disc bulge. Right greater than left facet hypertrophy. No stenosis. T5-6: Right paracentral disc protrusion indents the right ventral thecal sac. No significant spinal stenosis or cord deformity. Foramina remain patent. T6-7: Left paracentral disc protrusion indents the left ventral thecal sac. No significant stenosis or cord deformity. Foramina remain patent. T7-8: Negative interspace.  Posterior element hypertrophy. No stenosis. T8-9: Left paracentral disc protrusion indents the left ventral thecal sac. Minimal cord flattening without significant spinal stenosis. Superimposed posterior element hypertrophy. Foramina remain patent. T9-10: Central disc protrusion indents the ventral thecal sac. No significant spinal stenosis or cord deformity. Foramina remain patent. T10-11:  Negative interspace. Mild facet hypertrophy. No stenosis. T11-12: Disc desiccation with minimal disc bulge. Mild facet hypertrophy. No stenosis. T12-L1:  Unremarkable. MRI LUMBAR SPINE FINDINGS Segmentation: Standard. Lowest well-formed disc space labeled the L5-S1 level. Alignment: Mild straightening of the normal lumbar lordosis. Trace 2 mm anterolisthesis of L4 on L5, chronic and facet mediated. Vertebrae: Vertebral body height maintained without evidence for acute or chronic fracture. Bone marrow signal intensity within normal limits. No discrete or worrisome osseous lesions. No abnormal marrow edema. Conus medullaris and cauda equina: Conus extends to the L1-2 level. Conus and cauda equina appear normal. Paraspinal and other soft tissues: Paraspinous soft tissues within normal limits. Partially visualized urinary bladder moderately distended. Visualized visceral structures otherwise unremarkable. Disc levels: L1-2: Disc desiccation with mild disc bulge. Superimposed small central disc protrusion mildly indents the ventral thecal sac. No significant spinal stenosis. Foramina remain patent. L2-3:  Unremarkable. L3-4: Disc desiccation with mild disc bulge. Anterior annular fissure noted. Superimposed shallow left foraminal disc protrusion closely approximates the exiting left L3 nerve root (series 7, image 24). Mild facet hypertrophy. No significant spinal stenosis. Mild left greater  than right L3 foraminal narrowing. L4-5: Trace anterolisthesis. Disc desiccation with mild disc bulge. Prominent anterior annular fissure.  Superimposed central disc protrusion with associated superior migration. Mild bilateral facet hypertrophy. Resultant mild canal with left greater than right lateral recess stenosis. Mild left greater than right L4 foraminal narrowing. L5-S1: Disc desiccation with mild disc bulge. Mild bilateral facet hypertrophy. No significant spinal stenosis. Lateral recesses remain patent. No significant foraminal narrowing. IMPRESSION: MR THORACIC SPINE IMPRESSION 1. Acute to subacute compression fracture involving the T9 vertebral body with up to 35% height loss without bony retropulsion. No stenosis. 2. 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 No significant stenosis. MR LUMBAR SPINE IMPRESSION 1. No acute traumatic injury within the lumbar spine. 2. Disc bulge with small central disc protrusion and facet hypertrophy at L4-5 with resultant mild canal with mild bilateral lateral recess stenosis, with mild bilateral L4 foraminal narrowing, greater on the left. 3. Shallow left foraminal disc protrusion at L3-4, closely approximating and potentially affecting the exiting left L3 nerve root. Electronically Signed   By: Jeannine Boga M.D.   On: 08/03/2019 21:38   MR LUMBAR SPINE WO CONTRAST  Result Date: 08/03/2019 CLINICAL DATA:  Initial evaluation for acute trauma, fall, compression fracture. EXAM: MRI THORACIC AND LUMBAR SPINE WITHOUT CONTRAST TECHNIQUE: Multiplanar and multiecho pulse sequences of the thoracic and lumbar spine were obtained without intravenous contrast. COMPARISON:  Prior radiograph from earlier the same day. FINDINGS: MRI THORACIC SPINE FINDINGS Alignment: Trace scoliosis. Alignment otherwise normal with preservation of the normal thoracic kyphosis. No listhesis. Vertebrae: Acute to subacute compression fracture involving the T9 vertebral body with up to 35% height loss without bony retropulsion. This is benign/mechanical in appearance.  Otherwise, vertebral body height maintained without evidence for acute or chronic fracture. Bone marrow signal intensity within normal limits. Few scattered benign hemangiomata noted. No worrisome osseous lesions. No other abnormal marrow edema. Cord:  Signal intensity within the thoracic spinal cord is normal. Paraspinal and other soft tissues: Mild paraspinous edema adjacent to the T9 compression fracture. Paraspinous soft tissues otherwise within normal limits. Partially visualized lungs are clear. Visualized visceral structures unremarkable. Disc levels: T1-2:  Unremarkable. T2-3: Normal interspace. Right-sided facet hypertrophy. No stenosis. T3-4: Normal interspace. Right-sided facet hypertrophy. No stenosis. T4-5: Minimal disc bulge. Right greater than left facet hypertrophy. No stenosis. T5-6: Right paracentral disc protrusion indents the right ventral thecal sac. No significant spinal stenosis or cord deformity. Foramina remain patent. T6-7: Left paracentral disc protrusion indents the left ventral thecal sac. No significant stenosis or cord deformity. Foramina remain patent. T7-8: Negative interspace. Posterior element hypertrophy. No stenosis. T8-9: Left paracentral disc protrusion indents the left ventral thecal sac. Minimal cord flattening without significant spinal stenosis. Superimposed posterior element hypertrophy. Foramina remain patent. T9-10: Central disc protrusion indents the ventral thecal sac. No significant spinal stenosis or cord deformity. Foramina remain patent. T10-11:  Negative interspace. Mild facet hypertrophy. No stenosis. T11-12: Disc desiccation with minimal disc bulge. Mild facet hypertrophy. No stenosis. T12-L1:  Unremarkable. MRI LUMBAR SPINE FINDINGS Segmentation: Standard. Lowest well-formed disc space labeled the L5-S1 level. Alignment: Mild straightening of the normal lumbar lordosis. Trace 2 mm anterolisthesis of L4 on L5, chronic and facet mediated. Vertebrae: Vertebral  body height maintained without evidence for acute or chronic fracture. Bone marrow signal intensity within normal limits. No discrete or worrisome osseous lesions. No abnormal marrow edema. Conus medullaris and cauda equina: Conus extends to the L1-2 level. Conus  and cauda equina appear normal. Paraspinal and other soft tissues: Paraspinous soft tissues within normal limits. Partially visualized urinary bladder moderately distended. Visualized visceral structures otherwise unremarkable. Disc levels: L1-2: Disc desiccation with mild disc bulge. Superimposed small central disc protrusion mildly indents the ventral thecal sac. No significant spinal stenosis. Foramina remain patent. L2-3:  Unremarkable. L3-4: Disc desiccation with mild disc bulge. Anterior annular fissure noted. Superimposed shallow left foraminal disc protrusion closely approximates the exiting left L3 nerve root (series 7, image 24). Mild facet hypertrophy. No significant spinal stenosis. Mild left greater than right L3 foraminal narrowing. L4-5: Trace anterolisthesis. Disc desiccation with mild disc bulge. Prominent anterior annular fissure. Superimposed central disc protrusion with associated superior migration. Mild bilateral facet hypertrophy. Resultant mild canal with left greater than right lateral recess stenosis. Mild left greater than right L4 foraminal narrowing. L5-S1: Disc desiccation with mild disc bulge. Mild bilateral facet hypertrophy. No significant spinal stenosis. Lateral recesses remain patent. No significant foraminal narrowing. IMPRESSION: MR THORACIC SPINE IMPRESSION 1. Acute to subacute compression fracture involving the T9 vertebral body with up to 35% height loss without bony retropulsion. No stenosis. 2. 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 No significant stenosis. MR LUMBAR SPINE IMPRESSION 1. No acute traumatic injury within the lumbar spine. 2. Disc bulge  with small central disc protrusion and facet hypertrophy at L4-5 with resultant mild canal with mild bilateral lateral recess stenosis, with mild bilateral L4 foraminal narrowing, greater on the left. 3. Shallow left foraminal disc protrusion at L3-4, closely approximating and potentially affecting the exiting left L3 nerve root. Electronically Signed   By: Jeannine Boga M.D.   On: 08/03/2019 21:38     Labs:   Basic Metabolic Panel: Recent Labs  Lab 08/03/19 1950 08/04/19 0743  NA 141 142  K 3.7 4.0  CL 106 106  CO2 25 28  GLUCOSE 182* 103*  BUN 20 17  CREATININE 1.26* 1.16*  CALCIUM 9.4 9.1   GFR Estimated Creatinine Clearance: 31.1 mL/min (A) (by C-G formula based on SCr of 1.16 mg/dL (H)). Liver Function Tests: Recent Labs  Lab 08/03/19 1950  AST 22  ALT 22  ALKPHOS 77  BILITOT 0.6  PROT 7.5  ALBUMIN 3.7   No results for input(s): LIPASE, AMYLASE in the last 168 hours. No results for input(s): AMMONIA in the last 168 hours. Coagulation profile No results for input(s): INR, PROTIME in the last 168 hours.  CBC: Recent Labs  Lab 08/03/19 1950 08/04/19 0743  WBC 7.5 7.4  HGB 11.0* 10.8*  HCT 33.8* 32.4*  MCV 94.2 93.9  PLT 193 191   Cardiac Enzymes: No results for input(s): CKTOTAL, CKMB, CKMBINDEX, TROPONINI in the last 168 hours. BNP: Invalid input(s): POCBNP CBG: No results for input(s): GLUCAP in the last 168 hours. D-Dimer No results for input(s): DDIMER in the last 72 hours. Hgb A1c Recent Labs    08/04/19 0743  HGBA1C 5.9*   Lipid Profile No results for input(s): CHOL, HDL, LDLCALC, TRIG, CHOLHDL, LDLDIRECT in the last 72 hours. Thyroid function studies No results for input(s): TSH, T4TOTAL, T3FREE, THYROIDAB in the last 72 hours.  Invalid input(s): FREET3 Anemia work up Recent Labs    08/04/19 0743  VITAMINB12 280  FOLATE 36.0  FERRITIN 135  TIBC 265  IRON 47  RETICCTPCT 1.3   Microbiology No results found for this or any  previous visit (from the past 240 hour(s)).   Discharge Instructions:  Discharge Instructions    Diet - low sodium heart healthy   Complete by: As directed    Increase activity slowly   Complete by: As directed      Allergies as of 08/04/2019   No Known Allergies     Medication List    TAKE these medications   acetaminophen 500 MG tablet Commonly known as: TYLENOL Take 2 tablets (1,000 mg total) by mouth every 8 (eight) hours for 7 days.   diclofenac Sodium 1 % Gel Commonly known as: VOLTAREN Apply 2 g topically 4 (four) times daily for 7 days.   hydrALAZINE 25 MG tablet Commonly known as: APRESOLINE Take 1 tablet (25 mg total) by mouth 3 (three) times daily. Start taking on: Aug 11, 2019      Follow-up Information    [facility] on 08/17/2019.   Why: You are scheduled for a hospital followup appointment on August 17, 2019 at 3 pm.  This clinic will serve as your primary care physician clinic for illness or injury. Contact information: [address] [place] [address] [phone]           Time coordinating discharge: 25 min  Signed:  Geradine Girt DO  [facility] 08/04/2019, 2:47 PM

## 2019-08-04 NOTE — Progress Notes (Signed)
Patient discharging home. Discharge instructions explained to patient's son and he verbalized understanding. Took all personal belongings. No further questions or concerns voiced.

## 2019-08-04 NOTE — TOC Initial Note (Signed)
Transition of Care [facility]) - Initial/Assessment Note    Patient Details  Name: Danielle Houston MRN: [id number] Date of Birth: 1940/03/28  Transition of Care [facility]) CM/SW Contact:    Curlene Labrum, RN Phone Number: 08/04/2019, 12:51 PM  Clinical Narrative:                  Case management met with the patient and son, [name] at the bedside.  Patient admitted for low back pain.  Spoke with the family and son was given information regarding - Scientist, research (physical sciences) and Wellness.  [facility] closed for lunch but I will call to make an appointment at 2 pm when they open to schedule an appointment when possible.   Expected Discharge Plan: Home/Self Care Barriers to Discharge: No Barriers Identified   Patient Goals and CMS Choice Patient states their goals for this hospitalization and ongoing recovery are:: I want my mom to get better.   Choice offered to / list presented to : [place] / Geraldine Contras   [phone])  Expected Discharge Plan and Services Expected Discharge Plan: Home/Self Care   Discharge Planning Services: CM Consult   Living arrangements for the past 2 months: Single Family Home Expected Discharge Date: 08/04/19                                    Prior Living Arrangements/Services Living arrangements for the past 2 months: Single Family Home Lives with:: Adult Children Patient language and need for interpreter reviewed:: Yes Do you feel safe going back to the place where you live?: Yes      Need for Family Participation in Patient Care: Yes (Comment) Care giver support system in place?: Yes (comment)   Criminal Activity/Legal Involvement Pertinent to Current Situation/Hospitalization: No - Comment as needed  Activities of Daily Living      Permission Sought/Granted Permission sought to share information with : Case Manager Permission granted to share information with : Yes, Verbal Permission Granted         Permission granted to share info w Relationship: son, [name]     Emotional Assessment Appearance:: Appears stated age Attitude/Demeanor/Rapport: Gracious Affect (typically observed): Accepting Orientation: : Oriented to Self, Oriented to Place Alcohol / Substance Use: Not Applicable Psych Involvement: No (comment)  Admission diagnosis:  Low back pain [M54.5] Numbness in left leg [R20.0] Compression fracture of body of thoracic vertebra ([place]) [S22.000A] Patient Active Problem List   Diagnosis Date Noted  . Low back pain 08/03/2019  . CAD (coronary artery disease) 08/03/2019  . Anemia 08/03/2019  . Hypothyroidism 08/03/2019   PCP:  Patient, No Pcp Per Pharmacy:   [id number] [place] [id number]: [phone]: [phone]    Social Determinants of Health (SDOH) Interventions    Readmission Risk Interventions No flowsheet data found.

## 2019-08-16 NOTE — Progress Notes (Signed)
Danielle Houston, is a 79 y.o. female  WUJ:[id number]  [id number]:[id number]  DOB - [birth year]  Subjective:  Chief Complaint and HPI: Danielle Houston is a 79 y.o. female here today to establish care and for a follow up visit after hospitalization 5/19-5/20/2021.  Back pain is much improved.  Still no urinary s/sx.  Just started hydralazine bc was taking meds from [place] until a few days ago.  Wants to start meds for diabetes.  I told them it was likely easily diet controlled but she does not want to change her diet.  Also h/o hypothyroidism but not on meds.    "Vicente Males"  With [employer] interpreters translating.  The patient's son is with her today   From discharge summary: Danielle Houston a 79 y.o.femalewithhistory of CAD hypertension hypothyroidism had a fall at home when she was walking and slipped and fell down to the floor and hit the back. In the process she did hit her head did not lose consciousness. Since then she has been having low back pain. Denies any incontinence of urine or difficulty moving extremities.  ED Course:In the ER patient was able to move all extremities but since patient was complaining significant pain in the low back MRI was done of the L-spine and T-spine which shows some mild canal stenosis. Patient admitted for further observation. Labs show hemoglobin of 11 no old labs to compare blood glucose 182. Creatinine 1.2. Medication list has to be updated. Covid test pending.  Low back pain -Status post fall -MRI of thoracic and lumbar spine does show mild canal stenosis but no alarming findings PT consult appreciated and patient does not need follow-up  Normocytic anemia -Outpatient follow-up with PCP  Hyperglycemia Hemoglobin A1c 5.9  HTN -start hydralazine when runs out of her current meds (unknown-- [place] writing)  ED/Hospital notes reviewed.    ROS:   Constitutional:  No f/c, No night sweats, No unexplained weight loss. EENT:  No vision changes, No blurry  vision, No hearing changes. No mouth, throat, or ear problems.  Respiratory: No cough, No SOB Cardiac: No CP, no palpitations GI:  No abd pain, No N/V/D. GU: No Urinary s/sx Musculoskeletal: No joint pain Neuro: No headache, no dizziness, no motor weakness.  Skin: No rash Endocrine:  No polydipsia. No polyuria.  Psych: Denies SI/HI  No problems updated.  ALLERGIES: No Known Allergies  PAST MEDICAL HISTORY: Past Medical History:  Diagnosis Date   Coronary artery disease    Hypertension    Hypothyroidism     MEDICATIONS AT HOME: Prior to Admission medications   Medication Sig Start Date End Date Taking? Authorizing Provider  hydrALAZINE (APRESOLINE) 25 MG tablet Take 1 tablet (25 mg total) by mouth 3 (three) times daily. 08/17/19 09/16/19 Yes Maddoxx Burkitt, Dionne Bucy, PA-C  diclofenac Sodium (VOLTAREN) 1 % GEL Apply 2 g topically 4 (four) times daily. Prn pain 08/17/19   Argentina Donovan, PA-C  metFORMIN (GLUCOPHAGE) 500 MG tablet Take 0.5 tablets (250 mg total) by mouth daily with breakfast. 08/17/19   Argentina Donovan, PA-C     Objective:  EXAM:   Vitals:   08/17/19 1520  BP: (!) 165/70  Pulse: 71  SpO2: 98%  Height: 5\' 2"  (1.575 m)    General appearance : A&OX3. NAD. Non-toxic-appearing HEENT: Atraumatic and Normocephalic.  PERRLA. EOM intact.   Chest/Lungs:  Breathing-non-labored, Good air entry bilaterally, breath sounds normal without rales, rhonchi, or wheezing  CVS: S1 S2 regular, no murmurs, gallops, rubs  Extremities: Bilateral Lower  Ext shows no edema, both legs are warm to touch with = pulse throughout Neurology:  CN II-XII grossly intact, Non focal.   Psych:  TP linear. J/I WNL. Normal speech. Appropriate eye contact and affect.  Skin:  No Rash  Data Review Lab Results  Component Value Date   HGBA1C 5.9 (H) 08/04/2019     Assessment & Plan   1. Hyperglycemia I have had a lengthy discussion and provided education about insulin resistance and the intake  of too much sugar/refined carbohydrates.  I have advised the patient to work at a goal of eliminating sugary drinks, candy, desserts, sweets, refined sugars, processed foods, and white carbohydrates.  The patient expresses understanding.  -will start very low dose metformin per patient request - Glucose (CBG) - metFORMIN (GLUCOPHAGE) 500 MG tablet; Take 0.5 tablets (250 mg total) by mouth daily with breakfast.  Dispense: 30 tablet; Refill: 3  2. Contusion of back, unspecified laterality, sequela Resolving/much improved - diclofenac Sodium (VOLTAREN) 1 % GEL; Apply 2 g topically 4 (four) times daily. Prn pain  Dispense: 100 g; Refill: 2  3. Encounter for examination following treatment at hospital Much improved  4. Hypertension, unspecified type Has BP cuff at home;  Just started meds a few days ago.  Check BP daily and record and bring to next visit - hydrALAZINE (APRESOLINE) 25 MG tablet; Take 1 tablet (25 mg total) by mouth 3 (three) times daily.  Dispense: 90 tablet; Refill: 2  5. Language barrier [employer] interpreters used and additional time performing visit was required.  6. Hypothyroidism, unspecified type - Thyroid Panel With TSH   Patient have been counseled extensively about nutrition and exercise  Return in about 2 months (around 10/17/2019) for assign PCP;  check htn, DM.  The patient was given clear instructions to go to ER or return to medical center if symptoms don't improve, worsen or new problems develop. The patient verbalized understanding. The patient was told to call to get lab results if they haven't heard anything in the next week.     Freeman Caldron, PA-C [facility] and [place], [place]   08/17/2019, 3:44 PM

## 2019-08-17 ENCOUNTER — Other Ambulatory Visit: Payer: Self-pay

## 2019-08-17 ENCOUNTER — Ambulatory Visit: Payer: Medicaid Other | Attending: Family Medicine | Admitting: Physician Assistant

## 2019-08-17 VITALS — BP 165/70 | HR 71 | Ht 62.0 in

## 2019-08-17 DIAGNOSIS — E039 Hypothyroidism, unspecified: Secondary | ICD-10-CM | POA: Diagnosis not present

## 2019-08-17 DIAGNOSIS — S20229S Contusion of unspecified back wall of thorax, sequela: Secondary | ICD-10-CM

## 2019-08-17 DIAGNOSIS — I2510 Atherosclerotic heart disease of native coronary artery without angina pectoris: Secondary | ICD-10-CM | POA: Insufficient documentation

## 2019-08-17 DIAGNOSIS — Z789 Other specified health status: Secondary | ICD-10-CM

## 2019-08-17 DIAGNOSIS — Z7984 Long term (current) use of oral hypoglycemic drugs: Secondary | ICD-10-CM | POA: Insufficient documentation

## 2019-08-17 DIAGNOSIS — I10 Essential (primary) hypertension: Secondary | ICD-10-CM | POA: Insufficient documentation

## 2019-08-17 DIAGNOSIS — Z09 Encounter for follow-up examination after completed treatment for conditions other than malignant neoplasm: Secondary | ICD-10-CM | POA: Diagnosis not present

## 2019-08-17 DIAGNOSIS — R739 Hyperglycemia, unspecified: Secondary | ICD-10-CM

## 2019-08-17 DIAGNOSIS — Z9181 History of falling: Secondary | ICD-10-CM | POA: Insufficient documentation

## 2019-08-17 DIAGNOSIS — S300XXS Contusion of lower back and pelvis, sequela: Secondary | ICD-10-CM | POA: Insufficient documentation

## 2019-08-17 DIAGNOSIS — Z79899 Other long term (current) drug therapy: Secondary | ICD-10-CM | POA: Diagnosis not present

## 2019-08-17 DIAGNOSIS — D649 Anemia, unspecified: Secondary | ICD-10-CM | POA: Insufficient documentation

## 2019-08-17 DIAGNOSIS — E1165 Type 2 diabetes mellitus with hyperglycemia: Secondary | ICD-10-CM | POA: Insufficient documentation

## 2019-08-17 DIAGNOSIS — W19XXXS Unspecified fall, sequela: Secondary | ICD-10-CM | POA: Diagnosis not present

## 2019-08-17 LAB — GLUCOSE, POCT (MANUAL RESULT ENTRY): POC Glucose: 159 mg/dl — AB (ref 70–99)

## 2019-08-17 MED ORDER — HYDRALAZINE HCL 25 MG PO TABS: 25.0000 mg | ORAL_TABLET | Freq: Three times a day (TID) | ORAL | 2 refills | Status: DC

## 2019-08-17 MED ORDER — METFORMIN HCL 500 MG PO TABS: 250.0000 mg | ORAL_TABLET | Freq: Every day | ORAL | 3 refills | Status: DC

## 2019-08-17 MED ORDER — DICLOFENAC SODIUM 1 % EX GEL: 2.0000 g | Freq: Four times a day (QID) | CUTANEOUS | 2 refills | Status: DC

## 2019-08-17 MED FILL — METFORMIN HCL 500 MG TABS: 500 | 90 days supply | Qty: 45 | Fill #0

## 2019-08-17 MED FILL — DICLOFENAC SODIUM 1% GEL: 1 | 12 days supply | Qty: 100 | Fill #0

## 2019-08-18 LAB — THYROID PANEL WITH TSH
Free Thyroxine Index: 2.6 (ref 1.2–4.9)
T3 Uptake Ratio: 31 % (ref 24–39)
T4, Total: 8.3 ug/dL (ref 4.5–12.0)
TSH: 1.95 u[IU]/mL (ref 0.450–4.500)

## 2019-08-29 MED FILL — hydrALAZINE HCL 25 MG TABS: 25 | 30 days supply | Qty: 90 | Fill #0

## 2019-09-15 MED FILL — DICLOFENAC SODIUM 1% GEL: 1 | 12 days supply | Qty: 100 | Fill #1

## 2019-09-21 ENCOUNTER — Encounter: Payer: Self-pay | Admitting: Family Medicine

## 2019-09-21 ENCOUNTER — Other Ambulatory Visit: Payer: Self-pay | Admitting: Family Medicine

## 2019-09-21 ENCOUNTER — Ambulatory Visit: Payer: Medicaid Other | Attending: Family Medicine | Admitting: Family Medicine

## 2019-09-21 ENCOUNTER — Other Ambulatory Visit: Payer: Self-pay

## 2019-09-21 VITALS — BP 192/80 | HR 79 | Ht 62.0 in | Wt 125.0 lb

## 2019-09-21 DIAGNOSIS — I10 Essential (primary) hypertension: Secondary | ICD-10-CM

## 2019-09-21 DIAGNOSIS — I25119 Atherosclerotic heart disease of native coronary artery with unspecified angina pectoris: Secondary | ICD-10-CM

## 2019-09-21 DIAGNOSIS — S22070D Wedge compression fracture of T9-T10 vertebra, subsequent encounter for fracture with routine healing: Secondary | ICD-10-CM

## 2019-09-21 DIAGNOSIS — E2839 Other primary ovarian failure: Secondary | ICD-10-CM

## 2019-09-21 DIAGNOSIS — R7303 Prediabetes: Secondary | ICD-10-CM

## 2019-09-21 DIAGNOSIS — D6489 Other specified anemias: Secondary | ICD-10-CM

## 2019-09-21 DIAGNOSIS — Z1159 Encounter for screening for other viral diseases: Secondary | ICD-10-CM

## 2019-09-21 DIAGNOSIS — E039 Hypothyroidism, unspecified: Secondary | ICD-10-CM

## 2019-09-21 DIAGNOSIS — Z8739 Personal history of other diseases of the musculoskeletal system and connective tissue: Secondary | ICD-10-CM

## 2019-09-21 MED ORDER — LIDOCAINE 5 % EX PTCH: 1.0000 | MEDICATED_PATCH | CUTANEOUS | 1 refills | Status: DC

## 2019-09-21 MED ORDER — HYDRALAZINE HCL 50 MG PO TABS: 50.0000 mg | ORAL_TABLET | Freq: Two times a day (BID) | ORAL | 3 refills | Status: DC

## 2019-09-21 MED ORDER — ATORVASTATIN CALCIUM 40 MG PO TABS: 40.0000 mg | ORAL_TABLET | Freq: Every day | ORAL | 3 refills | Status: DC

## 2019-09-21 MED ORDER — CARVEDILOL 6.25 MG PO TABS: 6.2500 mg | ORAL_TABLET | Freq: Two times a day (BID) | ORAL | 3 refills | Status: DC

## 2019-09-21 MED FILL — CARVEDILOL 6.25 MG TABLET: 6.25 | 30 days supply | Qty: 60 | Fill #0

## 2019-09-21 MED FILL — ATORVASTATIN CALCIUM 40 MG: 40 | 30 days supply | Qty: 30 | Fill #0

## 2019-09-21 MED FILL — hydrALAZINE HCL 50 MG TABS: 50 | 30 days supply | Qty: 60 | Fill #0

## 2019-09-21 NOTE — Patient Instructions (Signed)

## 2019-09-21 NOTE — Progress Notes (Signed)
Subjective:  Patient ID: Danielle Houston, female    DOB: July 13, 1940  Age: 79 y.o. MRN: [id number]  CC: Establish Care   HPI Danielle Houston is a 79 year old female CAD s/p CABG  (in [place]), hypothyroidism, prediabetes, essential hypertension who presents today to establish care. In 07/2019 she was hospitalized for T9 compression fracture after fall.  Hospital notes indicate patient did not need PT follow-up. She still has back pain in her thoracolumbar region midline and on left and right lateral regions which is described as moderate.  Pain has not affected her gait or ability to perform her ADLs.  MRI spine revealed: IMPRESSION: MR THORACIC SPINE IMPRESSION  1. Acute to subacute compression fracture involving the T9 vertebral body with up to 35% height loss without bony retropulsion. No stenosis. 2. 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 No significant stenosis.  MR LUMBAR SPINE IMPRESSION  1. No acute traumatic injury within the lumbar spine. 2. Disc bulge with small central disc protrusion and facet hypertrophy at L4-5 with resultant mild canal with mild bilateral lateral recess stenosis, with mild bilateral L4 foraminal narrowing, greater on the left. 3. Shallow left foraminal disc protrusion at L3-4, closely approximating and potentially affecting the exiting left L3 nerve root.   Her BP is still elevated despite compliance with her antihypertensives. Son states she previously took medications for "her Thyroid and Heart" when she was in [place] and also took another medication to prevent blood clots.  He is concerned she is not on those medications at this time.  She does not have her empty bottles with her and is unsure of the doses or names of the medications.  Past Medical History:  Diagnosis Date   Coronary artery disease    Hypertension    Hypothyroidism     Past Surgical History:  Procedure Laterality  Date   CARDIAC CATHETERIZATION     CORONARY ANGIOPLASTY      Family History  Problem Relation Age of Onset   CAD Neg Hx     No Known Allergies  Outpatient Medications Prior to Visit  Medication Sig Dispense Refill   diclofenac Sodium (VOLTAREN) 1 % GEL Apply 2 g topically 4 (four) times daily. Prn pain 100 g 2   metFORMIN (GLUCOPHAGE) 500 MG tablet Take 0.5 tablets (250 mg total) by mouth daily with breakfast. 30 tablet 3   hydrALAZINE (APRESOLINE) 25 MG tablet Take 1 tablet (25 mg total) by mouth 3 (three) times daily. 90 tablet 2   No facility-administered medications prior to visit.     ROS Review of Systems  Constitutional: Negative for activity change, appetite change and fatigue.  HENT: Negative for congestion, sinus pressure and sore throat.   Eyes: Negative for visual disturbance.  Respiratory: Negative for cough, chest tightness, shortness of breath and wheezing.   Cardiovascular: Negative for chest pain and palpitations.  Gastrointestinal: Negative for abdominal distention, abdominal pain and constipation.  Endocrine: Negative for polydipsia.  Genitourinary: Negative for dysuria and frequency.  Musculoskeletal: Positive for back pain. Negative for arthralgias.  Skin: Negative for rash.  Neurological: Negative for tremors, light-headedness and numbness.  Hematological: Does not bruise/bleed easily.  Psychiatric/Behavioral: Negative for agitation and behavioral problems.    Objective:  BP (!) 192/80    Pulse 79    Ht 5\' 2"  (1.575 m)    Wt 125 lb (56.7 kg)    SpO2 98%    BMI 22.86 kg/m  BP/Weight 09/21/2019 08/17/2019 6/46/8032  Systolic BP 122 482 500  Diastolic BP 80 70 80  Wt. (Lbs) 125 - 125  BMI 22.86 22.86 22.86      Physical Exam Constitutional:      Appearance: She is well-developed.  Neck:     Vascular: No JVD.  Cardiovascular:     Rate and Rhythm: Normal rate.     Heart sounds: Normal heart sounds. No murmur heard.   Pulmonary:      Effort: Pulmonary effort is normal.     Breath sounds: Normal breath sounds. No wheezing or rales.  Chest:     Chest wall: No tenderness.  Abdominal:     General: Bowel sounds are normal. There is no distension.     Palpations: Abdomen is soft. There is no mass.     Tenderness: There is no abdominal tenderness.  Musculoskeletal:        General: Tenderness present. Normal range of motion.     Right lower leg: No edema.     Left lower leg: No edema.     Comments: Slight thoracolumbar spine tenderness; negative straight leg raise bilaterally  Neurological:     Mental Status: She is alert and oriented to person, place, and time.  Psychiatric:        Mood and Affect: Mood normal.     CMP Latest Ref Rng & Units 08/04/2019 08/03/2019  Glucose 70 - 99 mg/dL 103(H) 182(H)  BUN 8 - 23 mg/dL 17 20  Creatinine 0.44 - 1.00 mg/dL 1.16(H) 1.26(H)  Sodium 135 - 145 mmol/L 142 141  Potassium 3.5 - 5.1 mmol/L 4.0 3.7  Chloride 98 - 111 mmol/L 106 106  CO2 22 - 32 mmol/L 28 25  Calcium 8.9 - 10.3 mg/dL 9.1 9.4  Total Protein 6.5 - 8.1 g/dL - 7.5  Total Bilirubin 0.3 - 1.2 mg/dL - 0.6  Alkaline Phos 38 - 126 U/L - 77  AST 15 - 41 U/L - 22  ALT 0 - 44 U/L - 22    Lipid Panel  No results found for: CHOL, TRIG, HDL, CHOLHDL, VLDL, LDLCALC, LDLDIRECT  CBC    Component Value Date/Time   WBC 7.4 08/04/2019 0743   RBC 3.45 (L) 08/04/2019 0743   RBC 3.41 (L) 08/04/2019 0743   HGB 10.8 (L) 08/04/2019 0743   HCT 32.4 (L) 08/04/2019 0743   PLT 191 08/04/2019 0743   MCV 93.9 08/04/2019 0743   MCH 31.3 08/04/2019 0743   MCHC 33.3 08/04/2019 0743   RDW 11.9 08/04/2019 0743    Lab Results  Component Value Date   HGBA1C 5.9 (H) 08/04/2019    Assessment & Plan:  1. Essential hypertension Uncontrolled Coreg added to regimen Hydralazine regimen changed from 25 mg 3 times daily to 50 mg twice daily Counseled on blood pressure goal of less than 130/80, low-sodium, DASH diet, medication  compliance, 150 minutes of moderate intensity exercise per week. Discussed medication compliance, adverse effects. - hydrALAZINE (APRESOLINE) 50 MG tablet; Take 1 tablet (50 mg total) by mouth in the morning and at bedtime.  Dispense: 60 tablet; Refill: 3 - carvedilol (COREG) 6.25 MG tablet; Take 1 tablet (6.25 mg total) by mouth 2 (two) times daily with a meal.  Dispense: 60 tablet; Refill: 3  2. Pre-diabetes Controlled with A1c of 5.9 Continue Metformin  3. Compression fracture of T9 vertebra with routine healing, subsequent encounter Ongoing pain Prescribe Lidoderm patch Holding off on muscle relaxants due to age We will order  bone density - lidocaine (LIDODERM) 5 %; Place 1 patch onto the skin daily. Remove & Discard patch within 12 hours or as directed by MD  Dispense: 30 patch; Refill: 1 - DG Bone Density; Future  4. Estrogen deficiency - DG Bone Density; Future  5. Coronary artery disease involving native coronary artery of native heart with angina pectoris ([place]) Placed on statin for secondary prevention Son will review her medications at home to see if she was on Plavix Advised to commence aspirin - Lipid panel; Future - atorvastatin (LIPITOR) 40 MG tablet; Take 1 tablet (40 mg total) by mouth daily.  Dispense: 30 tablet; Refill: 3  6. Hypothyroidism, unspecified type We will send off thyroid panel and placed on levothyroxine if indicated - T4, free - TSH  7. Anemia due to other cause, not classified She was slightly anemic during hospitalization We will check hemoglobin level - CBC with Differential/Platelet  8. Need for hepatitis C screening test - HCV RNA quant rflx ultra or genotyp([facility])  9. History of gout Son reports a remote history of gout and is requesting uric acid level checked - Uric Acid   Meds ordered this encounter  Medications   hydrALAZINE (APRESOLINE) 50 MG tablet    Sig: Take 1 tablet (50 mg total) by mouth in the morning and at  bedtime.    Dispense:  60 tablet    Refill:  3    Dose change   lidocaine (LIDODERM) 5 %    Sig: Place 1 patch onto the skin daily. Remove & Discard patch within 12 hours or as directed by MD    Dispense:  30 patch    Refill:  1   carvedilol (COREG) 6.25 MG tablet    Sig: Take 1 tablet (6.25 mg total) by mouth 2 (two) times daily with a meal.    Dispense:  60 tablet    Refill:  3   atorvastatin (LIPITOR) 40 MG tablet    Sig: Take 1 tablet (40 mg total) by mouth daily.    Dispense:  30 tablet    Refill:  3    Follow-up: Return in about 1 month (around 10/22/2019) for Hypertension and back pain.       Charlott Rakes, MD, FAAFP. [facility] and [place], [place]   09/21/2019, 5:52 PM

## 2019-09-22 NOTE — Addendum Note (Signed)
Addended bySteffanie Dunn on: 09/22/2019 09:34 AM   Modules accepted: Orders

## 2019-09-23 LAB — LIPID PANEL
Chol/HDL Ratio: 3.6 ratio (ref 0.0–4.4)
Cholesterol, Total: 192 mg/dL (ref 100–199)
HDL: 54 mg/dL (ref >39)
LDL Chol Calc (NIH): 101 mg/dL — ABNORMAL HIGH (ref 0–99)
Triglycerides: 216 mg/dL — ABNORMAL HIGH (ref 0–149)
VLDL Cholesterol Cal: 37 mg/dL (ref 5–40)

## 2019-09-23 LAB — CBC WITH DIFFERENTIAL/PLATELET
Basophils Absolute: 0.1 10*3/uL (ref 0.0–0.2)
Basos: 1 %
EOS (ABSOLUTE): 0.1 10*3/uL (ref 0.0–0.4)
Eos: 1 %
Hematocrit: 34.1 % (ref 34.0–46.6)
Hemoglobin: 11 g/dL — ABNORMAL LOW (ref 11.1–15.9)
Immature Grans (Abs): 0 10*3/uL (ref 0.0–0.1)
Immature Granulocytes: 0 %
Lymphocytes Absolute: 2.5 10*3/uL (ref 0.7–3.1)
Lymphs: 39 %
MCH: 30.5 pg (ref 26.6–33.0)
MCHC: 32.3 g/dL (ref 31.5–35.7)
MCV: 95 fL (ref 79–97)
Monocytes Absolute: 0.4 10*3/uL (ref 0.1–0.9)
Monocytes: 6 %
Neutrophils Absolute: 3.4 10*3/uL (ref 1.4–7.0)
Neutrophils: 53 %
Platelets: 229 10*3/uL (ref 150–450)
RBC: 3.61 x10E6/uL — ABNORMAL LOW (ref 3.77–5.28)
RDW: 12.6 % (ref 11.7–15.4)
WBC: 6.4 10*3/uL (ref 3.4–10.8)

## 2019-09-23 LAB — TSH: TSH: 2.18 u[IU]/mL (ref 0.450–4.500)

## 2019-09-23 LAB — HCV RNA QUANT RFLX ULTRA OR GENOTYP: HCV Quant Baseline: NOT DETECTED IU/mL

## 2019-09-23 LAB — URIC ACID: Uric Acid: 4.6 mg/dL (ref 3.1–7.9)

## 2019-09-23 LAB — T4, FREE: Free T4: 1.33 ng/dL (ref 0.82–1.77)

## 2019-09-29 ENCOUNTER — Telehealth: Payer: Self-pay

## 2019-09-29 NOTE — Telephone Encounter (Signed)
Patient name and DOB has been verified Patient was informed of lab results. Patient had no questions.  

## 2019-09-29 NOTE — Telephone Encounter (Signed)
-----   Message from Charlott Rakes, MD sent at 09/26/2019  8:40 AM EDT ----- Anemia has improved compared to previous labs, thyroid labs are normal hence she does not need thyroid medication.  Uric acid is normal and she does not need any medication for gout especially if she has had no flares.  Bad cholesterol is slightly elevated and I would recommend continuing with her cholesterol medication which I placed her on at her last visit given her underlying history of coronary artery disease.

## 2019-10-19 MED FILL — hydrALAZINE HCL 50 MG TABS: 50 | 30 days supply | Qty: 60 | Fill #1

## 2019-10-19 MED FILL — CARVEDILOL 6.25 MG TABLET: 6.25 | 30 days supply | Qty: 60 | Fill #1

## 2019-10-19 MED FILL — ATORVASTATIN CALCIUM 40 MG: 40 | 30 days supply | Qty: 30 | Fill #1

## 2019-10-26 ENCOUNTER — Ambulatory Visit: Payer: Medicaid Other | Attending: Family Medicine | Admitting: Family Medicine

## 2019-10-26 ENCOUNTER — Encounter: Payer: Self-pay | Admitting: Family Medicine

## 2019-10-26 VITALS — BP 178/76 | HR 63 | Ht 62.0 in | Wt 126.0 lb

## 2019-10-26 DIAGNOSIS — I10 Essential (primary) hypertension: Secondary | ICD-10-CM | POA: Diagnosis not present

## 2019-10-26 MED ORDER — ASPIRIN EC 81 MG PO TBEC
81.0000 mg | DELAYED_RELEASE_TABLET | Freq: Every day | ORAL | 11 refills | Status: AC
Start: 2019-10-26 — End: ?

## 2019-10-26 NOTE — Progress Notes (Signed)
Subjective:  Patient ID: Danielle Houston, female    DOB: 10-02-1940  Age: 79 y.o. MRN: [id number]  CC: Hypertension and Back Pain   HPI Danielle Houston is a 79 year old female CAD s/p CABG  (in [place]), hypothyroidism, prediabetes, essential hypertension, T9 compression fractures after a fall who presents today for follow-up of her blood pressure. Son states she took only one of her antihypertensives this morning and her blood pressure  is elevated at 178/76. With regards to her back pain she is using the Lidoderm patch and has noticed significant improvement in her symptoms. She has no additional concerns today. Past Medical History:  Diagnosis Date  . Coronary artery disease   . Hypertension   . Hypothyroidism     Past Surgical History:  Procedure Laterality Date  . CARDIAC CATHETERIZATION    . CORONARY ANGIOPLASTY      Family History  Problem Relation Age of Onset  . CAD Neg Hx     No Known Allergies  Outpatient Medications Prior to Visit  Medication Sig Dispense Refill  . atorvastatin (LIPITOR) 40 MG tablet Take 1 tablet (40 mg total) by mouth daily. 30 tablet 3  . carvedilol (COREG) 6.25 MG tablet Take 1 tablet (6.25 mg total) by mouth 2 (two) times daily with a meal. 60 tablet 3  . diclofenac Sodium (VOLTAREN) 1 % GEL Apply 2 g topically 4 (four) times daily. Prn pain 100 g 2  . lidocaine (LIDODERM) 5 % Place 1 patch onto the skin daily. Remove & Discard patch within 12 hours or as directed by MD 30 patch 1  . metFORMIN (GLUCOPHAGE) 500 MG tablet Take 0.5 tablets (250 mg total) by mouth daily with breakfast. 30 tablet 3  . hydrALAZINE (APRESOLINE) 50 MG tablet Take 1 tablet (50 mg total) by mouth in the morning and at bedtime. 60 tablet 3   No facility-administered medications prior to visit.     ROS Review of Systems  Constitutional: Negative for activity change, appetite change and fatigue.  HENT: Negative for congestion, sinus pressure and sore throat.   Eyes: Negative  for visual disturbance.  Respiratory: Negative for cough, chest tightness, shortness of breath and wheezing.   Cardiovascular: Negative for chest pain and palpitations.  Gastrointestinal: Negative for abdominal distention, abdominal pain and constipation.  Endocrine: Negative for polydipsia.  Genitourinary: Negative for dysuria and frequency.  Musculoskeletal: Negative for arthralgias and back pain.  Skin: Negative for rash.  Neurological: Negative for tremors, light-headedness and numbness.  Hematological: Does not bruise/bleed easily.  Psychiatric/Behavioral: Negative for agitation and behavioral problems.    Objective:  BP (!) 178/76   Pulse 63   Ht 5\' 2"  (1.575 m)   Wt 126 lb (57.2 kg)   SpO2 99%   BMI 23.05 kg/m   BP/Weight 10/26/2019 1/0/2725 05/21/6438  Systolic BP 347 425 956  Diastolic BP 76 80 70  Wt. (Lbs) 126 125 -  BMI 23.05 22.86 22.86      Physical Exam Constitutional:      Appearance: She is well-developed.  Neck:     Vascular: No JVD.  Cardiovascular:     Rate and Rhythm: Normal rate.     Heart sounds: Normal heart sounds. No murmur heard.   Pulmonary:     Effort: Pulmonary effort is normal.     Breath sounds: Normal breath sounds. No wheezing or rales.  Chest:     Chest wall: No tenderness.  Abdominal:     General: Bowel sounds  are normal. There is no distension.     Palpations: Abdomen is soft. There is no mass.     Tenderness: There is no abdominal tenderness.  Musculoskeletal:        General: Normal range of motion.     Right lower leg: No edema.     Left lower leg: No edema.  Neurological:     Mental Status: She is alert and oriented to person, place, and time.  Psychiatric:        Mood and Affect: Mood normal.     CMP Latest Ref Rng & Units 08/04/2019 08/03/2019  Glucose 70 - 99 mg/dL 103(H) 182(H)  BUN 8 - 23 mg/dL 17 20  Creatinine 0.44 - 1.00 mg/dL 1.16(H) 1.26(H)  Sodium 135 - 145 mmol/L 142 141  Potassium 3.5 - 5.1 mmol/L 4.0 3.7   Chloride 98 - 111 mmol/L 106 106  CO2 22 - 32 mmol/L 28 25  Calcium 8.9 - 10.3 mg/dL 9.1 9.4  Total Protein 6.5 - 8.1 g/dL - 7.5  Total Bilirubin 0.3 - 1.2 mg/dL - 0.6  Alkaline Phos 38 - 126 U/L - 77  AST 15 - 41 U/L - 22  ALT 0 - 44 U/L - 22    Lipid Panel     Component Value Date/Time   CHOL 192 09/22/2019 0938   TRIG 216 (H) 09/22/2019 0938   HDL 54 09/22/2019 0938   CHOLHDL 3.6 09/22/2019 0938   LDLCALC 101 (H) 09/22/2019 0938    CBC    Component Value Date/Time   WBC 6.4 09/22/2019 0938   WBC 7.4 08/04/2019 0743   RBC 3.61 (L) 09/22/2019 0938   RBC 3.45 (L) 08/04/2019 0743   RBC 3.41 (L) 08/04/2019 0743   HGB 11.0 (L) 09/22/2019 0938   HCT 34.1 09/22/2019 0938   PLT 229 09/22/2019 0938   MCV 95 09/22/2019 0938   MCH 30.5 09/22/2019 0938   MCH 31.3 08/04/2019 0743   MCHC 32.3 09/22/2019 0938   MCHC 33.3 08/04/2019 0743   RDW 12.6 09/22/2019 0938   LYMPHSABS 2.5 09/22/2019 0938   EOSABS 0.1 09/22/2019 0938   BASOSABS 0.1 09/22/2019 0938    Lab Results  Component Value Date   HGBA1C 5.9 (H) 08/04/2019    Assessment & Plan:  1. Essential hypertension Uncontrolled She is yet to take a second antihypertensive and has been educated about proper administration of medications-carvedilol and hydralazine twice daily I will make no addition to her regimen but if blood pressure is elevated at next visit consider amlodipine Counseled on blood pressure goal of less than 130/80, low-sodium, DASH diet, medication compliance, 150 minutes of moderate intensity exercise per week. Discussed medication compliance, adverse effects.     Meds ordered this encounter  Medications  . aspirin EC 81 MG tablet    Sig: Take 1 tablet (81 mg total) by mouth daily. Swallow whole.    Dispense:  30 tablet    Refill:  11    Follow-up: Return in about 3 months (around 01/26/2020) for chronic disease management.       Charlott Rakes, MD, FAAFP. [facility]  and [place], [place]   10/26/2019, 11:35 AM

## 2019-11-23 MED FILL — hydrALAZINE HCL 50 MG TABS: 50 | 30 days supply | Qty: 60 | Fill #2

## 2019-11-23 MED FILL — ATORVASTATIN CALCIUM 40 MG: 40 | 30 days supply | Qty: 30 | Fill #2

## 2019-11-23 MED FILL — CARVEDILOL 6.25 MG TABLET: 6.25 | 30 days supply | Qty: 60 | Fill #2

## 2019-12-20 MED FILL — hydrALAZINE HCL 50 MG TABS: 50 | 30 days supply | Qty: 60 | Fill #3

## 2019-12-20 MED FILL — ATORVASTATIN CALCIUM 40 MG: 40 | 30 days supply | Qty: 30 | Fill #3

## 2019-12-20 MED FILL — CARVEDILOL 6.25 MG TABLET: 6.25 | 30 days supply | Qty: 60 | Fill #3

## 2020-01-20 ENCOUNTER — Other Ambulatory Visit: Payer: Self-pay | Admitting: Pharmacist

## 2020-01-20 ENCOUNTER — Other Ambulatory Visit: Payer: Self-pay | Admitting: Family Medicine

## 2020-01-20 DIAGNOSIS — I25119 Atherosclerotic heart disease of native coronary artery with unspecified angina pectoris: Secondary | ICD-10-CM

## 2020-01-20 DIAGNOSIS — I10 Essential (primary) hypertension: Secondary | ICD-10-CM

## 2020-01-20 MED ORDER — HYDRALAZINE HCL 50 MG PO TABS: 50.0000 mg | ORAL_TABLET | Freq: Two times a day (BID) | ORAL | 0 refills | Status: DC

## 2020-01-20 MED ORDER — ATORVASTATIN CALCIUM 40 MG PO TABS: 40.0000 mg | ORAL_TABLET | Freq: Every day | ORAL | 0 refills | Status: DC

## 2020-01-20 MED ORDER — CARVEDILOL 6.25 MG PO TABS: 6.2500 mg | ORAL_TABLET | Freq: Two times a day (BID) | ORAL | 0 refills | Status: DC

## 2020-01-20 MED FILL — CARVEDILOL 6.25 MG TABLET: 6.25 | 30 days supply | Qty: 60 | Fill #0

## 2020-01-20 MED FILL — ATORVASTATIN CALCIUM 40 MG: 40 | 30 days supply | Qty: 30 | Fill #0

## 2020-01-20 MED FILL — hydrALAZINE HCL 50 MG TABS: 50 | 30 days supply | Qty: 60 | Fill #0

## 2020-01-23 ENCOUNTER — Other Ambulatory Visit: Payer: Self-pay | Admitting: Family Medicine

## 2020-01-23 ENCOUNTER — Ambulatory Visit: Payer: Medicaid Other | Attending: Family Medicine | Admitting: Family Medicine

## 2020-01-23 ENCOUNTER — Other Ambulatory Visit: Payer: Self-pay

## 2020-01-23 ENCOUNTER — Encounter: Payer: Self-pay | Admitting: Family Medicine

## 2020-01-23 VITALS — BP 180/70 | HR 80 | Temp 98.9°F | Ht 62.0 in | Wt 123.0 lb

## 2020-01-23 DIAGNOSIS — I10 Essential (primary) hypertension: Secondary | ICD-10-CM

## 2020-01-23 DIAGNOSIS — I25119 Atherosclerotic heart disease of native coronary artery with unspecified angina pectoris: Secondary | ICD-10-CM | POA: Diagnosis not present

## 2020-01-23 DIAGNOSIS — R7303 Prediabetes: Secondary | ICD-10-CM | POA: Diagnosis not present

## 2020-01-23 DIAGNOSIS — R202 Paresthesia of skin: Secondary | ICD-10-CM

## 2020-01-23 DIAGNOSIS — K219 Gastro-esophageal reflux disease without esophagitis: Secondary | ICD-10-CM

## 2020-01-23 DIAGNOSIS — R140 Abdominal distension (gaseous): Secondary | ICD-10-CM

## 2020-01-23 LAB — POCT GLYCOSYLATED HEMOGLOBIN (HGB A1C): HbA1c, POC (controlled diabetic range): 5.7 % (ref 0.0–7.0)

## 2020-01-23 MED ORDER — ATORVASTATIN CALCIUM 40 MG PO TABS: 40.0000 mg | ORAL_TABLET | Freq: Every day | ORAL | 6 refills | Status: DC

## 2020-01-23 MED ORDER — GABAPENTIN 300 MG PO CAPS: 300.0000 mg | ORAL_CAPSULE | Freq: Every day | ORAL | 3 refills | Status: DC

## 2020-01-23 MED ORDER — HYDRALAZINE HCL 50 MG PO TABS: 50.0000 mg | ORAL_TABLET | Freq: Two times a day (BID) | ORAL | 6 refills | Status: DC

## 2020-01-23 MED ORDER — OMEPRAZOLE 20 MG PO CPDR: 20.0000 mg | DELAYED_RELEASE_CAPSULE | Freq: Every day | ORAL | 2 refills | Status: DC

## 2020-01-23 MED ORDER — METFORMIN HCL 500 MG PO TABS: 250.0000 mg | ORAL_TABLET | Freq: Every day | ORAL | 6 refills | Status: DC

## 2020-01-23 MED ORDER — CARVEDILOL 12.5 MG PO TABS: 12.5000 mg | ORAL_TABLET | Freq: Two times a day (BID) | ORAL | 6 refills | Status: DC

## 2020-01-23 MED FILL — METFORMIN HCL 500 MG TABS: 500 | 30 days supply | Qty: 15 | Fill #0

## 2020-01-23 MED FILL — OMEPRAZOLE 20 MG CAP: 20 | 30 days supply | Qty: 30 | Fill #0

## 2020-01-23 MED FILL — GABAPENTIN 300 MG CAPSULE: 300 | 30 days supply | Qty: 30 | Fill #0

## 2020-01-23 MED FILL — CARVEDILOL 12.5 MG TABLET: 12.5 | 30 days supply | Qty: 60 | Fill #0

## 2020-01-23 NOTE — Patient Instructions (Signed)

## 2020-01-23 NOTE — Progress Notes (Signed)
States that stomach is bloated.

## 2020-01-23 NOTE — Progress Notes (Signed)
Subjective:  Patient ID: Danielle Houston, female    DOB: 06-12-1940  Age: 79 y.o. MRN: [id number]  CC: Hypertension   HPI Danielle Houston  is a 79 year old female CAD s/p CABG (in [place]),hypothyroidism, prediabetes (A1c 5.7), essential hypertension, T9 compression fractures after a fall who presents today for a follow-up visit.  She complains of a bloated abdomen which is chronic. She denies presence of constipation but does have diarrhea on some occassions. Bloating is not associated with any specific foods. She passes a lot of flatus and burps a lot. Denies presence of abdominal pain but does have indigestion and reflux.  She sometimes has tingling and numbness in her legs which is chronic but as per son this is worsening. Her back pain is controlled on her current regimen. Her blood pressure is significantly elevated and she endorses taking her antihypertensive today but her son states she took 2 medications and we are unsure of which two she took. Denies presence of chest pain, dyspnea, pedal edema, headaches, blurry vision. Past Medical History:  Diagnosis Date  . Coronary artery disease   . Hypertension   . Hypothyroidism     Past Surgical History:  Procedure Laterality Date  . CARDIAC CATHETERIZATION    . CORONARY ANGIOPLASTY      Family History  Problem Relation Age of Onset  . CAD Neg Hx     No Known Allergies  Outpatient Medications Prior to Visit  Medication Sig Dispense Refill  . aspirin EC 81 MG tablet Take 1 tablet (81 mg total) by mouth daily. Swallow whole. 30 tablet 11  . diclofenac Sodium (VOLTAREN) 1 % GEL Apply 2 g topically 4 (four) times daily. Prn pain 100 g 2  . lidocaine (LIDODERM) 5 % Place 1 patch onto the skin daily. Remove & Discard patch within 12 hours or as directed by MD 30 patch 1  . atorvastatin (LIPITOR) 40 MG tablet Take 1 tablet (40 mg total) by mouth daily. 30 tablet 0  . carvedilol (COREG) 6.25 MG tablet Take 1 tablet (6.25 mg total) by mouth  2 (two) times daily with a meal. 60 tablet 0  . hydrALAZINE (APRESOLINE) 50 MG tablet Take 1 tablet (50 mg total) by mouth in the morning and at bedtime. 60 tablet 0  . metFORMIN (GLUCOPHAGE) 500 MG tablet Take 0.5 tablets (250 mg total) by mouth daily with breakfast. 30 tablet 3   No facility-administered medications prior to visit.     ROS Review of Systems  Constitutional: Negative for activity change, appetite change and fatigue.  HENT: Negative for congestion, sinus pressure and sore throat.   Eyes: Negative for visual disturbance.  Respiratory: Negative for cough, chest tightness, shortness of breath and wheezing.   Cardiovascular: Negative for chest pain and palpitations.  Gastrointestinal: Negative for abdominal distention, abdominal pain and constipation.  Endocrine: Negative for polydipsia.  Genitourinary: Negative for dysuria and frequency.  Musculoskeletal: Negative for arthralgias and back pain.  Skin: Negative for rash.  Neurological: Positive for numbness. Negative for tremors and light-headedness.  Hematological: Does not bruise/bleed easily.  Psychiatric/Behavioral: Negative for agitation and behavioral problems.    Objective:  BP (!) 180/70   Pulse 80   Temp 98.9 F (37.2 C) (Oral)   Ht 5\' 2"  (1.575 m)   Wt 123 lb (55.8 kg)   SpO2 99%   BMI 22.50 kg/m   BP/Weight 01/23/2020 09/10/9483 06/20/2701  Systolic BP 500 938 182  Diastolic BP 70 76 80  Wt. (  Lbs) 123 126 125  BMI 22.5 23.05 22.86      Physical Exam Constitutional:      Appearance: She is well-developed.  Neck:     Vascular: No JVD.  Cardiovascular:     Rate and Rhythm: Normal rate.     Heart sounds: Normal heart sounds. No murmur heard.   Pulmonary:     Effort: Pulmonary effort is normal.     Breath sounds: Normal breath sounds. No wheezing or rales.  Chest:     Chest wall: No tenderness.  Abdominal:     General: Bowel sounds are normal. There is no distension.     Palpations: Abdomen  is soft. There is no mass.     Tenderness: There is no abdominal tenderness.  Musculoskeletal:        General: Normal range of motion.     Right lower leg: No edema.     Left lower leg: No edema.  Neurological:     Mental Status: She is alert and oriented to person, place, and time.  Psychiatric:        Mood and Affect: Mood normal.     CMP Latest Ref Rng & Units 08/04/2019 08/03/2019  Glucose 70 - 99 mg/dL 103(H) 182(H)  BUN 8 - 23 mg/dL 17 20  Creatinine 0.44 - 1.00 mg/dL 1.16(H) 1.26(H)  Sodium 135 - 145 mmol/L 142 141  Potassium 3.5 - 5.1 mmol/L 4.0 3.7  Chloride 98 - 111 mmol/L 106 106  CO2 22 - 32 mmol/L 28 25  Calcium 8.9 - 10.3 mg/dL 9.1 9.4  Total Protein 6.5 - 8.1 g/dL - 7.5  Total Bilirubin 0.3 - 1.2 mg/dL - 0.6  Alkaline Phos 38 - 126 U/L - 77  AST 15 - 41 U/L - 22  ALT 0 - 44 U/L - 22    Lipid Panel     Component Value Date/Time   CHOL 192 09/22/2019 0938   TRIG 216 (H) 09/22/2019 0938   HDL 54 09/22/2019 0938   CHOLHDL 3.6 09/22/2019 0938   LDLCALC 101 (H) 09/22/2019 0938    CBC    Component Value Date/Time   WBC 6.4 09/22/2019 0938   WBC 7.4 08/04/2019 0743   RBC 3.61 (L) 09/22/2019 0938   RBC 3.45 (L) 08/04/2019 0743   RBC 3.41 (L) 08/04/2019 0743   HGB 11.0 (L) 09/22/2019 0938   HCT 34.1 09/22/2019 0938   PLT 229 09/22/2019 0938   MCV 95 09/22/2019 0938   MCH 30.5 09/22/2019 0938   MCH 31.3 08/04/2019 0743   MCHC 32.3 09/22/2019 0938   MCHC 33.3 08/04/2019 0743   RDW 12.6 09/22/2019 0938   LYMPHSABS 2.5 09/22/2019 0938   EOSABS 0.1 09/22/2019 0938   BASOSABS 0.1 09/22/2019 0938    Lab Results  Component Value Date   HGBA1C 5.7 01/23/2020    Assessment & Plan:   1. Essential hypertension Uncontrolled Coreg dose increased from 6.25 to 12.5 mg twice daily Advised to take antihypertensive prior to her appointment She will follow up with the clinical pharmacist for BP evaluation Consider increasing hydralazine to 100 mg twice daily if  blood pressure remains above goal Counseled on blood pressure goal of less than 130/80, low-sodium, DASH diet, medication compliance, 150 minutes of moderate intensity exercise per week. Discussed medication compliance, adverse effects. - carvedilol (COREG) 12.5 MG tablet; Take 1 tablet (12.5 mg total) by mouth 2 (two) times daily with a meal.  Dispense: 60 tablet; Refill: 6 - hydrALAZINE (  APRESOLINE) 50 MG tablet; Take 1 tablet (50 mg total) by mouth in the morning and at bedtime.  Dispense: 60 tablet; Refill: 6  2. Coronary artery disease involving native coronary artery of native heart with angina pectoris (HCC) S/p CABG Asymptomatic Risk factor modification including blood pressure control - atorvastatin (LIPITOR) 40 MG tablet; Take 1 tablet (40 mg total) by mouth daily.  Dispense: 30 tablet; Refill: 6  3.  Prediabetes Controlled with A1c of 5.7 Consider discontinuing Metformin if A1c remains at this level at the next visit. - metFORMIN (GLUCOPHAGE) 500 MG tablet; Take 0.5 tablets (250 mg total) by mouth daily with breakfast.  Dispense: 30 tablet; Refill: 6 - POCT glycosylated hemoglobin (Hb A1C)  4. Paresthesia Unknown etiology Placed on gabapentin at night - gabapentin (NEURONTIN) 300 MG capsule; Take 1 capsule (300 mg total) by mouth at bedtime.  Dispense: 30 capsule; Refill: 3  5. Gastroesophageal reflux disease without esophagitis Avoid late meals; avoid recumbency up to 2 hours post meal - H. pylori breath test - omeprazole (PRILOSEC) 20 MG capsule; Take 1 capsule (20 mg total) by mouth daily.  Dispense: 30 capsule; Refill: 2  6. Abdominal bloating - H. pylori breath test   Meds ordered this encounter  Medications  . carvedilol (COREG) 12.5 MG tablet    Sig: Take 1 tablet (12.5 mg total) by mouth 2 (two) times daily with a meal.    Dispense:  60 tablet    Refill:  6    Dose change  . hydrALAZINE (APRESOLINE) 50 MG tablet    Sig: Take 1 tablet (50 mg total) by mouth  in the morning and at bedtime.    Dispense:  60 tablet    Refill:  6    Dose change  . atorvastatin (LIPITOR) 40 MG tablet    Sig: Take 1 tablet (40 mg total) by mouth daily.    Dispense:  30 tablet    Refill:  6  . metFORMIN (GLUCOPHAGE) 500 MG tablet    Sig: Take 0.5 tablets (250 mg total) by mouth daily with breakfast.    Dispense:  30 tablet    Refill:  6  . omeprazole (PRILOSEC) 20 MG capsule    Sig: Take 1 capsule (20 mg total) by mouth daily.    Dispense:  30 capsule    Refill:  2  . gabapentin (NEURONTIN) 300 MG capsule    Sig: Take 1 capsule (300 mg total) by mouth at bedtime.    Dispense:  30 capsule    Refill:  3    Follow-up: Return for BP check with Lurena Joiner; 3 months PCP.       Charlott Rakes, MD, FAAFP. [facility] and [place], [place]   01/23/2020, 12:01 PM

## 2020-01-25 ENCOUNTER — Ambulatory Visit: Payer: Medicaid Other | Admitting: Family Medicine

## 2020-01-25 LAB — H. PYLORI BREATH TEST: H pylori Breath Test: NEGATIVE

## 2020-01-27 ENCOUNTER — Telehealth: Payer: Self-pay

## 2020-01-27 NOTE — Telephone Encounter (Signed)
Patient was called and a voicemail was left informing patient to return phone call for lab results. 

## 2020-01-27 NOTE — Telephone Encounter (Signed)
-----   Message from Charlott Rakes, MD sent at 01/25/2020 19:62 PM EST ----- Helicobacter Pylori bacteria test is negative. Advise to continue with Omeprazole

## 2020-01-31 NOTE — Progress Notes (Unsigned)
   S:     PCP: Dr. Margarita Rana PMH: CAD s/p CABG, HTN,hypothyroidism, prediabetes, T9 compression fractures after a fall   Patient arrives in good spirits. Presents to the clinic for hypertension evaluation, counseling, and management.  Patient was referred and last seen by Primary Care Provider on 01/23/20 at which BP was 180/70 and carvedilol was increased from 6.25 mg to 12.5 mg BID and continued on hydralazine 50 mg BID.  Today, patient reports ***  Compliance? Took meds this morning? When do you take your meds? Dizziness, headaches, blurred vision? History of swelling? Check Clinic BP? Home BP logs? If no logs, bring to next visit w/ BP cuff Go over BP goals Additional BP therapy if needed - Consider increasing hydralazine to 100 mg twice daily per Dr. Margarita Rana -history of CAD and not on ACE/ARB? -no history of ACE/ARB, CCB, Thiazide?? -note in august - consider amlodipine  Diet??  Exercise??   Medication adherence *** .  Current BP Medications include:  Carvedilol 12.5 mg BID, hydralazine 50 mg BID  Antihypertensives tried in the past include: ***  Dietary habits include: *** Exercise habits include:*** Family / Social history: -Fhx: negative for CAD -Tobacco use: denies    O:   Home BP readings: ***  Last 3 Office BP readings: BP Readings from Last 3 Encounters:  01/23/20 (!) 180/70  10/26/19 (!) 178/76  09/21/19 (!) 192/80    BMET    Component Value Date/Time   NA 142 08/04/2019 0743   K 4.0 08/04/2019 0743   CL 106 08/04/2019 0743   CO2 28 08/04/2019 0743   GLUCOSE 103 (H) 08/04/2019 0743   BUN 17 08/04/2019 0743   CREATININE 1.16 (H) 08/04/2019 0743   CALCIUM 9.1 08/04/2019 0743   GFRNONAA 45 (L) 08/04/2019 0743   GFRAA 52 (L) 08/04/2019 0743    Renal function: CrCl cannot be calculated (Patient's most recent lab result is older than the maximum 21 days allowed.).  Clinical ASCVD: Yes  The 10-year ASCVD risk score Mikey Bussing DC Jr., et al., 2013)  is: 52.3%   Values used to calculate the score:     Age: 33 years     Sex: Female     Is Non-Hispanic African American: No     Diabetic: No     Tobacco smoker: No     Systolic Blood Pressure: 076 mmHg     Is BP treated: Yes     HDL Cholesterol: 54 mg/dL     Total Cholesterol: 192 mg/dL  PHQ-2 Score: ***   A/P: Hypertension diagnosed *** currently *** on current medications. BP Goal = < *** mmHg. Medication adherence ***.  -{Meds adjust:18428} ***.  -F/u labs ordered - *** -Counseled on lifestyle modifications for blood pressure control including reduced dietary sodium, increased exercise, adequate sleep.  Results reviewed and written information provided.   Total time in face-to-face counseling *** minutes.   F/U Clinic Visit in ***.   Lorel Monaco, PharmD PGY2 Ambulatory Care Resident [place]

## 2020-02-01 ENCOUNTER — Telehealth: Payer: Self-pay

## 2020-02-01 ENCOUNTER — Ambulatory Visit: Payer: Medicaid Other | Admitting: Pharmacist

## 2020-02-01 NOTE — Telephone Encounter (Signed)
Copied from [place] [id number]. Topic: General - Other >> Feb 01, 2020  9:24 AM Keene Breath wrote: Reason for CRM: Patient called to cancel appt. For today.  She stated she is sick and would like to reschedule for next week.  Please call patient to reschedule.

## 2020-02-16 ENCOUNTER — Telehealth (INDEPENDENT_AMBULATORY_CARE_PROVIDER_SITE_OTHER): Payer: Self-pay | Admitting: Family Medicine

## 2020-02-16 NOTE — Telephone Encounter (Signed)
Pt is asking for a call from a nurse regarding the    H. pylori breath test    Please advise and thank you

## 2020-02-16 NOTE — Telephone Encounter (Signed)
Pt was called and informed of lab results. 

## 2020-02-21 MED FILL — CARVEDILOL 12.5 MG TABLET: 12.5 | 30 days supply | Qty: 60 | Fill #1

## 2020-02-21 MED FILL — hydrALAZINE HCL 50 MG TABS: 50 | 30 days supply | Qty: 60 | Fill #0

## 2020-02-24 ENCOUNTER — Ambulatory Visit: Payer: Medicaid Other | Admitting: Pharmacist

## 2020-02-24 MED FILL — ATORVASTATIN CALCIUM 40 MG: 40 | 30 days supply | Qty: 30 | Fill #0

## 2020-03-02 ENCOUNTER — Ambulatory Visit: Payer: Self-pay

## 2020-03-02 ENCOUNTER — Telehealth: Payer: Self-pay | Admitting: Family Medicine

## 2020-03-02 NOTE — Telephone Encounter (Signed)
This patient had complained to our Pharmacist of pedal edema and was advised to split her Coreg 12.5mg  in half. Please advise her to stop the Gabapentin instead as that could be the cause of her pedal edema and continue with the whole pill of Coreg. She can schedule a visit with me for evaluation of her symptoms. Thanks.

## 2020-03-02 NOTE — Telephone Encounter (Signed)
Patients son called. Interpreter Shirlee Limerick [phone] was conferenced into conversation.  Communication is difficult Mom speaks different dialect and only states ok to speak with Ronalee Belts.Ronalee Belts states his mother is taking medication that pharmacist has suggested should be changed because his mother has left foot swelling.  The foot is red in color and the calf area of the left leg is numb. She dinies injury. She states she has had chills and some diarrhea but unsure if fever.  Per protocol Ronalee Belts was told his mother should go to ER for symptoms. He denies stating that he will watch her instead and wants medication to be clarified by Dr.Newlin. He refuses ER again. I told him again that she need ER care. Patient states that she sometimes has chest pain most when she is lying down . Again I states that it was important to take his mother to ER and that I would call 911. She/ he refused again stating that they would monitor.  Note will be routed to office for follow up. Reason for Disposition . Patient sounds very sick or weak to the triager  Answer Assessment - Initial Assessment Questions 1. LOCATION: "Which ankle is swollen?" "Where is the swelling?"     Just one foot left foot is reddish  2. ONSET: "When did the swelling start?"     3-4 days worse day before yesterday 3. SIZE: "How large is the swelling?"     Too swollen for shoe 4. PAIN: "Is there any pain?" If Yes, ask: "How bad is it?" (Scale 1-10; or mild, moderate, severe)   - NONE (0): no pain.   - MILD (1-3): doesn't interfere with normal activities.    - MODERATE (4-7): interferes with normal activities (e.g., work or school) or awakens from sleep, limping.    - SEVERE (8-10): excruciating pain, unable to do any normal activities, unable to walk.     Hard to walk, can go with support to BR  5. CAUSE: "What do you think caused the ankle swelling?"     Medication? Denies injury 6. OTHER SYMPTOMS: "Do you have any other symptoms?" (e.g., fever, chest  pain, difficulty breathing, calf pain)     sudden chills diarrhea numb calf area 7. PREGNANCY: "Is there any chance you are pregnant?" "When was your last menstrual period?"     N/A  Protocols used: ANKLE SWELLING-A-AH

## 2020-03-02 NOTE — Telephone Encounter (Signed)
Pt has been called and informed to stop taking Gabapentin and resume her full coreg.

## 2020-03-26 MED FILL — METFORMIN HCL 500 MG TABS: 500 | 30 days supply | Qty: 15 | Fill #1

## 2020-03-26 MED FILL — CARVEDILOL 12.5 MG TABLET: 12.5 | 30 days supply | Qty: 60 | Fill #2

## 2020-03-26 MED FILL — ATORVASTATIN CALCIUM 40 MG: 40 | 30 days supply | Qty: 30 | Fill #1

## 2020-03-26 MED FILL — hydrALAZINE HCL 50 MG TABS: 50 | 30 days supply | Qty: 60 | Fill #1

## 2020-04-24 ENCOUNTER — Ambulatory Visit: Payer: Medicaid Other | Attending: Family Medicine | Admitting: Family Medicine

## 2020-04-24 ENCOUNTER — Encounter: Payer: Self-pay | Admitting: Family Medicine

## 2020-04-24 ENCOUNTER — Other Ambulatory Visit: Payer: Self-pay

## 2020-04-24 ENCOUNTER — Other Ambulatory Visit: Payer: Self-pay | Admitting: Family Medicine

## 2020-04-24 DIAGNOSIS — I10 Essential (primary) hypertension: Secondary | ICD-10-CM | POA: Diagnosis not present

## 2020-04-24 DIAGNOSIS — K219 Gastro-esophageal reflux disease without esophagitis: Secondary | ICD-10-CM | POA: Diagnosis not present

## 2020-04-24 MED ORDER — HYDRALAZINE HCL 100 MG PO TABS: 100.0000 mg | ORAL_TABLET | Freq: Two times a day (BID) | ORAL | 6 refills | Status: DC

## 2020-04-24 MED ORDER — OMEPRAZOLE 20 MG PO CPDR: 20.0000 mg | DELAYED_RELEASE_CAPSULE | Freq: Every day | ORAL | 2 refills | Status: DC

## 2020-04-24 MED FILL — METFORMIN HCL 500 MG TABS: 500 | 30 days supply | Qty: 15 | Fill #2

## 2020-04-24 MED FILL — OMEPRAZOLE 20 MG CAP: 20 | 30 days supply | Qty: 30 | Fill #0

## 2020-04-24 MED FILL — ATORVASTATIN CALCIUM 40 MG: 40 | 30 days supply | Qty: 30 | Fill #2

## 2020-04-24 MED FILL — CARVEDILOL 12.5 MG TABLET: 12.5 | 30 days supply | Qty: 60 | Fill #3

## 2020-04-24 MED FILL — hydrALAZINE HCL 100 MG TABS: 100 | 30 days supply | Qty: 60 | Fill #0

## 2020-04-24 NOTE — Patient Instructions (Signed)

## 2020-04-24 NOTE — Progress Notes (Signed)
Subjective:  Patient ID: Danielle Houston, female    DOB: [birth year]  Age: 80 y.o. MRN: [id number]  CC: Hypertension   HPI Danielle Houston is a 80 year old female CAD s/p CABG (in [place]),hypothyroidism, prediabetes (A1c 5.7), essential hypertension, T9 compression fractures after a fall who presents today for a follow-up visit.  Gabapentin had caused her pedal edema and son had her stop it with resolution of edema. On further questioning son states she had pedal edema in the past.  Denies presence of dyspnea, weight gain, chest pain or palpitations. BP is severely elevated and she endorses taking her antihypertensive today. Son states ambulatory BP is not as high as it is usually at 165/86  . He attributes elevation to hurrying to get in to the Clinic. Compliant with all her other medications and she has no additional concerns at this time.  Past Medical History:  Diagnosis Date  . Coronary artery disease   . Hypertension   . Hypothyroidism     Past Surgical History:  Procedure Laterality Date  . CARDIAC CATHETERIZATION    . CORONARY ANGIOPLASTY      Family History  Problem Relation Age of Onset  . CAD Neg Hx     No Known Allergies  Outpatient Medications Prior to Visit  Medication Sig Dispense Refill  . aspirin EC 81 MG tablet Take 1 tablet (81 mg total) by mouth daily. Swallow whole. 30 tablet 11  . atorvastatin (LIPITOR) 40 MG tablet Take 1 tablet (40 mg total) by mouth daily. 30 tablet 6  . carvedilol (COREG) 12.5 MG tablet Take 1 tablet (12.5 mg total) by mouth 2 (two) times daily with a meal. 60 tablet 6  . diclofenac Sodium (VOLTAREN) 1 % GEL Apply 2 g topically 4 (four) times daily. Prn pain 100 g 2  . lidocaine (LIDODERM) 5 % Place 1 patch onto the skin daily. Remove & Discard patch within 12 hours or as directed by MD 30 patch 1  . metFORMIN (GLUCOPHAGE) 500 MG tablet Take 0.5 tablets (250 mg total) by mouth daily with breakfast. 30 tablet 6  . gabapentin (NEURONTIN) 300 MG  capsule Take 1 capsule (300 mg total) by mouth at bedtime. 30 capsule 3  . omeprazole (PRILOSEC) 20 MG capsule Take 1 capsule (20 mg total) by mouth daily. 30 capsule 2  . hydrALAZINE (APRESOLINE) 50 MG tablet Take 1 tablet (50 mg total) by mouth in the morning and at bedtime. 60 tablet 6   No facility-administered medications prior to visit.     ROS Review of Systems  Constitutional: Negative for activity change, appetite change and fatigue.  HENT: Negative for congestion, sinus pressure and sore throat.   Eyes: Negative for visual disturbance.  Respiratory: Negative for cough, chest tightness, shortness of breath and wheezing.   Cardiovascular: Negative for chest pain and palpitations.  Gastrointestinal: Negative for abdominal distention, abdominal pain and constipation.  Endocrine: Negative for polydipsia.  Genitourinary: Negative for dysuria and frequency.  Musculoskeletal: Negative for arthralgias and back pain.  Skin: Negative for rash.  Neurological: Negative for tremors, light-headedness and numbness.  Hematological: Does not bruise/bleed easily.  Psychiatric/Behavioral: Negative for agitation and behavioral problems.    Objective:  BP (!) 184/64   Pulse 67   Ht '5\' 2"'$  (1.575 m)   Wt 120 lb (54.4 kg)   SpO2 100%   BMI 21.95 kg/m   BP/Weight 04/24/2020 01/23/2020 [id number]  Systolic BP [id number]  Diastolic BP 64 70 76  Wt. (Lbs) 120 123 126  BMI 21.95 22.5 23.05      Physical Exam Constitutional:      Appearance: She is well-developed.  Neck:     Vascular: No JVD.  Cardiovascular:     Rate and Rhythm: Normal rate.     Heart sounds: Normal heart sounds. No murmur heard.   Pulmonary:     Effort: Pulmonary effort is normal.     Breath sounds: Normal breath sounds. No wheezing or rales.  Chest:     Chest wall: No tenderness.  Abdominal:     General: Bowel sounds are normal. There is no distension.     Palpations: Abdomen is soft. There is no mass.      Tenderness: There is no abdominal tenderness.  Musculoskeletal:        General: Normal range of motion.     Right lower leg: No edema.     Left lower leg: No edema.  Neurological:     Mental Status: She is alert and oriented to person, place, and time.  Psychiatric:        Mood and Affect: Mood normal.     CMP Latest Ref Rng & Units 08/04/2019 08/03/2019  Glucose 70 - 99 mg/dL 103(H) 182(H)  BUN 8 - 23 mg/dL 17 20  Creatinine 0.44 - 1.00 mg/dL 1.16(H) 1.26(H)  Sodium 135 - 145 mmol/L 142 141  Potassium 3.5 - 5.1 mmol/L 4.0 3.7  Chloride 98 - 111 mmol/L 106 106  CO2 22 - 32 mmol/L 28 25  Calcium 8.9 - 10.3 mg/dL 9.1 9.4  Total Protein 6.5 - 8.1 g/dL - 7.5  Total Bilirubin 0.3 - 1.2 mg/dL - 0.6  Alkaline Phos 38 - 126 U/L - 77  AST 15 - 41 U/L - 22  ALT 0 - 44 U/L - 22    Lipid Panel     Component Value Date/Time   CHOL 192 09/22/2019 0938   TRIG 216 (H) 09/22/2019 0938   HDL 54 09/22/2019 0938   CHOLHDL 3.6 09/22/2019 0938   LDLCALC 101 (H) 09/22/2019 0938    CBC    Component Value Date/Time   WBC 6.4 09/22/2019 0938   WBC 7.4 08/04/2019 0743   RBC 3.61 (L) 09/22/2019 0938   RBC 3.45 (L) 08/04/2019 0743   RBC 3.41 (L) 08/04/2019 0743   HGB 11.0 (L) 09/22/2019 0938   HCT 34.1 09/22/2019 0938   PLT 229 09/22/2019 0938   MCV 95 09/22/2019 0938   MCH 30.5 09/22/2019 0938   MCH 31.3 08/04/2019 0743   MCHC 32.3 09/22/2019 0938   MCHC 33.3 08/04/2019 0743   RDW 12.6 09/22/2019 0938   LYMPHSABS 2.5 09/22/2019 0938   EOSABS 0.1 09/22/2019 0938   BASOSABS 0.1 09/22/2019 0938    Lab Results  Component Value Date   HGBA1C 5.7 01/23/2020    Assessment & Plan:  1. Essential hypertension Uncontrolled Hydralazine dose increased Counseled on blood pressure goal of less than 130/80, low-sodium, DASH diet, medication compliance, 150 minutes of moderate intensity exercise per week. Discussed medication compliance, adverse effects. - Aldosterone + renin activity w/  ratio - Basic Metabolic Panel - hydrALAZINE (APRESOLINE) 100 MG tablet; Take 1 tablet (100 mg total) by mouth in the morning and at bedtime.  Dispense: 60 tablet; Refill: 6  2. Gastroesophageal reflux disease without esophagitis - omeprazole (PRILOSEC) 20 MG capsule; Take 1 capsule (20 mg total) by mouth daily.  Dispense: 30 capsule; Refill: 2    Meds  ordered this encounter  Medications  . hydrALAZINE (APRESOLINE) 100 MG tablet    Sig: Take 1 tablet (100 mg total) by mouth in the morning and at bedtime.    Dispense:  60 tablet    Refill:  6    Dose change  . omeprazole (PRILOSEC) 20 MG capsule    Sig: Take 1 capsule (20 mg total) by mouth daily.    Dispense:  30 capsule    Refill:  2    Follow-up: Return in about 1 month (around 05/22/2020).       Charlott Rakes, MD, FAAFP. [facility] and [place], [place]   04/24/2020, 1:03 PM

## 2020-04-28 LAB — BASIC METABOLIC PANEL
BUN/Creatinine Ratio: 14 (ref 12–28)
BUN: 19 mg/dL (ref 8–27)
CO2: 22 mmol/L (ref 20–29)
Calcium: 9.2 mg/dL (ref 8.7–10.3)
Chloride: 103 mmol/L (ref 96–106)
Creatinine, Ser: 1.38 mg/dL — ABNORMAL HIGH (ref 0.57–1.00)
GFR calc Af Amer: 42 mL/min/{1.73_m2} — ABNORMAL LOW (ref >59)
GFR calc non Af Amer: 36 mL/min/{1.73_m2} — ABNORMAL LOW (ref >59)
Glucose: 165 mg/dL — ABNORMAL HIGH (ref 65–99)
Potassium: 4.3 mmol/L (ref 3.5–5.2)
Sodium: 139 mmol/L (ref 134–144)

## 2020-04-28 LAB — ALDOSTERONE + RENIN ACTIVITY W/ RATIO
ALDOS/RENIN RATIO: 5.8 (ref 0.0–30.0)
ALDOSTERONE: 1.6 ng/dL (ref 0.0–30.0)
Renin: 0.276 ng/mL/hr (ref 0.167–5.380)

## 2020-05-11 ENCOUNTER — Telehealth: Payer: Self-pay

## 2020-05-11 NOTE — Telephone Encounter (Signed)
-----   Message from Charlott Rakes, MD sent at 04/29/2020  5:13 PM EST ----- Kidney function is slightly abnormal. Please advise to abstain from medications like Aleve, Ibuprofen. Other labs are stable.

## 2020-05-11 NOTE — Telephone Encounter (Signed)
Patient was called and a voicemail was left informing patient to return phone call for lab results. 

## 2020-05-23 MED FILL — hydrALAZINE HCL 100 MG TABS: 100 | 30 days supply | Qty: 60 | Fill #1

## 2020-05-23 MED FILL — OMEPRAZOLE 20 MG CAP: 20 | 30 days supply | Qty: 30 | Fill #1

## 2020-05-23 MED FILL — METFORMIN HCL 500 MG TABS: 500 | 30 days supply | Qty: 15 | Fill #3

## 2020-05-23 MED FILL — CARVEDILOL 12.5 MG TABLET: 12.5 | 30 days supply | Qty: 60 | Fill #4

## 2020-05-23 MED FILL — ATORVASTATIN CALCIUM 40 MG: 40 | 30 days supply | Qty: 30 | Fill #3

## 2020-05-29 ENCOUNTER — Ambulatory Visit: Payer: Medicaid Other | Attending: Family Medicine | Admitting: Family Medicine

## 2020-05-29 ENCOUNTER — Other Ambulatory Visit: Payer: Self-pay | Admitting: Family Medicine

## 2020-05-29 ENCOUNTER — Other Ambulatory Visit: Payer: Self-pay

## 2020-05-29 ENCOUNTER — Encounter: Payer: Self-pay | Admitting: Family Medicine

## 2020-05-29 VITALS — BP 154/67 | HR 63 | Ht 62.0 in | Wt 119.0 lb

## 2020-05-29 DIAGNOSIS — S22070D Wedge compression fracture of T9-T10 vertebra, subsequent encounter for fracture with routine healing: Secondary | ICD-10-CM

## 2020-05-29 DIAGNOSIS — H6123 Impacted cerumen, bilateral: Secondary | ICD-10-CM | POA: Diagnosis not present

## 2020-05-29 DIAGNOSIS — I10 Essential (primary) hypertension: Secondary | ICD-10-CM | POA: Diagnosis not present

## 2020-05-29 DIAGNOSIS — H9313 Tinnitus, bilateral: Secondary | ICD-10-CM | POA: Diagnosis not present

## 2020-05-29 MED ORDER — DICLOFENAC SODIUM 1 % EX GEL
2.0000 g | Freq: Four times a day (QID) | CUTANEOUS | 2 refills | Status: DC
Start: 2020-05-29 — End: 2020-05-29

## 2020-05-29 MED ORDER — LISINOPRIL 5 MG PO TABS: 5.0000 mg | ORAL_TABLET | Freq: Every day | ORAL | 3 refills | Status: DC

## 2020-05-29 NOTE — Progress Notes (Signed)
Has had breakfast and medication.

## 2020-05-29 NOTE — Progress Notes (Signed)
Subjective:  Patient ID: Danielle Houston, female    DOB: 02/20/1941  Age: 80 y.o. MRN: [id number]  CC: Hypertension   HPI Danielle Houston is a 80 year old female CAD s/p CABG (in [place]),hypothyroidism, prediabetes(A1c 5.7), essential hypertension, T9 compression fractures after a fall who presents today forafollow-upof her hypertension. At her last office visit hydralazine dose was increased and her blood pressure has improved today but is still not at goal.  She endorses compliance with all her additional medications. She is requesting a refill of Voltaren gel which she uses for her chronic back pain.  Complains of tinnitus in both ears which are not precipitated by any activities and sometimes has pounding in her head.  Denies presence of nausea, blurry vision.  Past Medical History:  Diagnosis Date  . Coronary artery disease   . Hypertension   . Hypothyroidism     Past Surgical History:  Procedure Laterality Date  . CARDIAC CATHETERIZATION    . CORONARY ANGIOPLASTY      Family History  Problem Relation Age of Onset  . CAD Neg Hx     No Known Allergies  Outpatient Medications Prior to Visit  Medication Sig Dispense Refill  . aspirin EC 81 MG tablet Take 1 tablet (81 mg total) by mouth daily. Swallow whole. 30 tablet 11  . atorvastatin (LIPITOR) 40 MG tablet Take 1 tablet (40 mg total) by mouth daily. 30 tablet 6  . carvedilol (COREG) 12.5 MG tablet Take 1 tablet (12.5 mg total) by mouth 2 (two) times daily with a meal. 60 tablet 6  . lidocaine (LIDODERM) 5 % Place 1 patch onto the skin daily. Remove & Discard patch within 12 hours or as directed by MD 30 patch 1  . metFORMIN (GLUCOPHAGE) 500 MG tablet Take 0.5 tablets (250 mg total) by mouth daily with breakfast. 30 tablet 6  . omeprazole (PRILOSEC) 20 MG capsule Take 1 capsule (20 mg total) by mouth daily. 30 capsule 2  . diclofenac Sodium (VOLTAREN) 1 % GEL Apply 2 g topically 4 (four) times daily. Prn pain 100 g 2  .  hydrALAZINE (APRESOLINE) 100 MG tablet Take 1 tablet (100 mg total) by mouth in the morning and at bedtime. 60 tablet 6   No facility-administered medications prior to visit.     ROS Review of Systems  Constitutional: Negative for activity change, appetite change and fatigue.  HENT: Positive for tinnitus. Negative for congestion, sinus pressure and sore throat.   Eyes: Negative for visual disturbance.  Respiratory: Negative for cough, chest tightness, shortness of breath and wheezing.   Cardiovascular: Negative for chest pain and palpitations.  Gastrointestinal: Negative for abdominal distention, abdominal pain and constipation.  Endocrine: Negative for polydipsia.  Genitourinary: Negative for dysuria and frequency.  Musculoskeletal: Negative for arthralgias and back pain.  Skin: Negative for rash.  Neurological: Negative for tremors, light-headedness and numbness.  Hematological: Does not bruise/bleed easily.  Psychiatric/Behavioral: Negative for agitation and behavioral problems.    Objective:  BP (!) 154/67   Pulse 63   Ht '5\' 2"'$  (1.575 m)   Wt 119 lb (54 kg)   SpO2 99%   BMI 21.77 kg/m   BP/Weight 05/29/2020 04/24/2020 123456  Systolic BP 123456 [id number]  Diastolic BP 67 64 70  Wt. (Lbs) 119 120 123  BMI 21.77 21.95 22.5      Physical Exam Constitutional:      Appearance: She is well-developed.  HENT:     Ears:  Comments: Bilateral cerumen impaction Neck:     Vascular: No JVD.  Cardiovascular:     Rate and Rhythm: Normal rate.     Heart sounds: Normal heart sounds. No murmur heard.   Pulmonary:     Effort: Pulmonary effort is normal.     Breath sounds: Normal breath sounds. No wheezing or rales.  Chest:     Chest wall: No tenderness.  Abdominal:     General: Bowel sounds are normal. There is no distension.     Palpations: Abdomen is soft. There is no mass.     Tenderness: There is no abdominal tenderness.  Musculoskeletal:        General: Normal range  of motion.     Right lower leg: No edema.     Left lower leg: No edema.  Neurological:     Mental Status: She is alert and oriented to person, place, and time.  Psychiatric:        Mood and Affect: Mood normal.     CMP Latest Ref Rng & Units 04/24/2020 08/04/2019 08/03/2019  Glucose 65 - 99 mg/dL 165(H) 103(H) 182(H)  BUN 8 - 27 mg/dL '19 17 20  '$ Creatinine 0.57 - 1.00 mg/dL 1.38(H) 1.16(H) 1.26(H)  Sodium 134 - 144 mmol/L 139 142 141  Potassium 3.5 - 5.2 mmol/L 4.3 4.0 3.7  Chloride 96 - 106 mmol/L 103 106 106  CO2 20 - 29 mmol/L '22 28 25  '$ Calcium 8.7 - 10.3 mg/dL 9.2 9.1 9.4  Total Protein 6.5 - 8.1 g/dL - - 7.5  Total Bilirubin 0.3 - 1.2 mg/dL - - 0.6  Alkaline Phos 38 - 126 U/L - - 77  AST 15 - 41 U/L - - 22  ALT 0 - 44 U/L - - 22    Lipid Panel     Component Value Date/Time   CHOL 192 09/22/2019 0938   TRIG 216 (H) 09/22/2019 0938   HDL 54 09/22/2019 0938   CHOLHDL 3.6 09/22/2019 0938   LDLCALC 101 (H) 09/22/2019 0938    CBC    Component Value Date/Time   WBC 6.4 09/22/2019 0938   WBC 7.4 08/04/2019 0743   RBC 3.61 (L) 09/22/2019 0938   RBC 3.45 (L) 08/04/2019 0743   RBC 3.41 (L) 08/04/2019 0743   HGB 11.0 (L) 09/22/2019 0938   HCT 34.1 09/22/2019 0938   PLT 229 09/22/2019 0938   MCV 95 09/22/2019 0938   MCH 30.5 09/22/2019 0938   MCH 31.3 08/04/2019 0743   MCHC 32.3 09/22/2019 0938   MCHC 33.3 08/04/2019 0743   RDW 12.6 09/22/2019 0938   LYMPHSABS 2.5 09/22/2019 0938   EOSABS 0.1 09/22/2019 0938   BASOSABS 0.1 09/22/2019 0938    Lab Results  Component Value Date   HGBA1C 5.7 01/23/2020    Assessment & Plan:  1. Tinnitus of both ears Discussed pathophysiology of tinnitus Advised to use earplugs We will treat hypertension in hopes that this might improve Presence of cerumen could also be contributory  2. Bilateral impacted cerumen Advised to obtain OTC ceruminolytic  3. Essential hypertension Improved but not at goal Low-dose of lisinopril  added to regimen Continue other antihypertensives We will check potassium in 2 weeks given recent addition of lisinopril - lisinopril (ZESTRIL) 5 MG tablet; Take 1 tablet (5 mg total) by mouth daily.  Dispense: 30 tablet; Refill: 3 - Basic Metabolic Panel; Future  4. Compression fracture of T9 vertebra with routine healing, subsequent encounter Stable Doing well on Voltaren  gel - diclofenac Sodium (VOLTAREN) 1 % GEL; Apply 2 g topically 4 (four) times daily. Prn pain  Dispense: 100 g; Refill: 2    Meds ordered this encounter  Medications  . diclofenac Sodium (VOLTAREN) 1 % GEL    Sig: Apply 2 g topically 4 (four) times daily. Prn pain    Dispense:  100 g    Refill:  2  . lisinopril (ZESTRIL) 5 MG tablet    Sig: Take 1 tablet (5 mg total) by mouth daily.    Dispense:  30 tablet    Refill:  3    Follow-up: Return in about 3 months (around 08/29/2020) for chronic medical conditions.       Charlott Rakes, MD, FAAFP. [facility] and [place], [place]   05/29/2020, 11:57 AM

## 2020-05-29 NOTE — Patient Instructions (Signed)

## 2020-06-22 ENCOUNTER — Other Ambulatory Visit: Payer: Self-pay

## 2020-06-22 MED FILL — Carvedilol Tab 12.5 MG: ORAL | 30 days supply | Qty: 60 | Fill #0 | Status: AC

## 2020-06-22 MED FILL — Diclofenac Sodium Gel 1% (1.16% Diethylamine Equiv): CUTANEOUS | 12 days supply | Qty: 100 | Fill #0 | Status: AC

## 2020-06-22 MED FILL — Omeprazole Cap Delayed Release 20 MG: ORAL | 30 days supply | Qty: 30 | Fill #0 | Status: AC

## 2020-06-22 MED FILL — Metformin HCl Tab 500 MG: ORAL | 60 days supply | Qty: 30 | Fill #0 | Status: CN

## 2020-06-22 MED FILL — Hydralazine HCl Tab 100 MG: ORAL | 30 days supply | Qty: 60 | Fill #0 | Status: AC

## 2020-06-22 MED FILL — Atorvastatin Calcium Tab 40 MG (Base Equivalent): ORAL | 30 days supply | Qty: 30 | Fill #0 | Status: AC

## 2020-06-25 ENCOUNTER — Other Ambulatory Visit: Payer: Self-pay

## 2020-06-25 MED FILL — Metformin HCl Tab 500 MG: ORAL | 30 days supply | Qty: 15 | Fill #0 | Status: AC

## 2020-06-26 ENCOUNTER — Other Ambulatory Visit: Payer: Self-pay

## 2020-07-18 ENCOUNTER — Other Ambulatory Visit: Payer: Self-pay | Admitting: Family Medicine

## 2020-07-18 ENCOUNTER — Other Ambulatory Visit: Payer: Self-pay

## 2020-07-18 DIAGNOSIS — K219 Gastro-esophageal reflux disease without esophagitis: Secondary | ICD-10-CM

## 2020-07-18 MED ORDER — OMEPRAZOLE 20 MG PO CPDR
DELAYED_RELEASE_CAPSULE | Freq: Every day | ORAL | 1 refills | Status: DC
  Filled 2020-07-18: qty 90, 90d supply, fill #0
  Filled 2020-10-31: qty 30, 30d supply, fill #0
  Filled 2021-01-08: qty 30, 30d supply, fill #1
  Filled 2021-02-27: qty 30, 30d supply, fill #2
  Filled 2021-03-29: qty 30, 30d supply, fill #0
  Filled 2021-05-08: qty 30, 30d supply, fill #1
  Filled 2021-06-05: qty 30, 30d supply, fill #2

## 2020-07-18 MED FILL — Lisinopril Tab 5 MG: ORAL | 30 days supply | Qty: 30 | Fill #0 | Status: AC

## 2020-07-18 MED FILL — Diclofenac Sodium Gel 1% (1.16% Diethylamine Equiv): CUTANEOUS | 12 days supply | Qty: 100 | Fill #1 | Status: AC

## 2020-07-18 MED FILL — Hydralazine HCl Tab 100 MG: ORAL | 30 days supply | Qty: 60 | Fill #1 | Status: AC

## 2020-07-18 MED FILL — Metformin HCl Tab 500 MG: ORAL | 30 days supply | Qty: 15 | Fill #1 | Status: AC

## 2020-07-18 MED FILL — Atorvastatin Calcium Tab 40 MG (Base Equivalent): ORAL | 30 days supply | Qty: 30 | Fill #1 | Status: AC

## 2020-07-18 MED FILL — Carvedilol Tab 12.5 MG: ORAL | 30 days supply | Qty: 60 | Fill #1 | Status: AC

## 2020-07-25 ENCOUNTER — Other Ambulatory Visit: Payer: Self-pay

## 2020-08-17 ENCOUNTER — Other Ambulatory Visit: Payer: Self-pay | Admitting: Family Medicine

## 2020-08-17 ENCOUNTER — Other Ambulatory Visit: Payer: Self-pay

## 2020-08-17 DIAGNOSIS — I10 Essential (primary) hypertension: Secondary | ICD-10-CM

## 2020-08-17 DIAGNOSIS — S22070D Wedge compression fracture of T9-T10 vertebra, subsequent encounter for fracture with routine healing: Secondary | ICD-10-CM

## 2020-08-17 MED ORDER — CARVEDILOL 12.5 MG PO TABS
ORAL_TABLET | Freq: Two times a day (BID) | ORAL | 6 refills | Status: DC
  Filled 2020-08-17: qty 60, 30d supply, fill #0
  Filled 2020-09-18: qty 60, 30d supply, fill #1
  Filled 2020-10-17: qty 60, 30d supply, fill #2
  Filled 2021-01-08: qty 60, 30d supply, fill #3
  Filled 2021-02-18: qty 60, 30d supply, fill #4
  Filled 2021-03-29: qty 60, 30d supply, fill #0
  Filled 2021-05-08: qty 60, 30d supply, fill #1

## 2020-08-17 MED ORDER — DICLOFENAC SODIUM 1 % EX GEL
CUTANEOUS | 2 refills | Status: DC
  Filled 2020-08-17: qty 100, 12d supply, fill #0
  Filled 2020-09-18: qty 100, 12d supply, fill #1
  Filled 2020-09-25 – 2020-10-17 (×3): qty 100, 12d supply, fill #2

## 2020-08-17 MED FILL — Metformin HCl Tab 500 MG: ORAL | 30 days supply | Qty: 15 | Fill #2 | Status: AC

## 2020-08-17 MED FILL — Hydralazine HCl Tab 100 MG: ORAL | 30 days supply | Qty: 60 | Fill #2 | Status: AC

## 2020-08-17 MED FILL — Lisinopril Tab 5 MG: ORAL | 30 days supply | Qty: 30 | Fill #1 | Status: AC

## 2020-08-17 MED FILL — Atorvastatin Calcium Tab 40 MG (Base Equivalent): ORAL | 30 days supply | Qty: 30 | Fill #2 | Status: AC

## 2020-08-29 ENCOUNTER — Ambulatory Visit: Payer: Medicaid Other | Attending: Family Medicine | Admitting: Family Medicine

## 2020-08-29 ENCOUNTER — Other Ambulatory Visit: Payer: Self-pay

## 2020-08-29 ENCOUNTER — Encounter: Payer: Self-pay | Admitting: Family Medicine

## 2020-08-29 VITALS — BP 179/69 | HR 60 | Ht 62.0 in | Wt 111.8 lb

## 2020-08-29 DIAGNOSIS — M10071 Idiopathic gout, right ankle and foot: Secondary | ICD-10-CM | POA: Diagnosis not present

## 2020-08-29 DIAGNOSIS — I25119 Atherosclerotic heart disease of native coronary artery with unspecified angina pectoris: Secondary | ICD-10-CM | POA: Diagnosis not present

## 2020-08-29 DIAGNOSIS — R7303 Prediabetes: Secondary | ICD-10-CM | POA: Insufficient documentation

## 2020-08-29 DIAGNOSIS — Z7984 Long term (current) use of oral hypoglycemic drugs: Secondary | ICD-10-CM | POA: Diagnosis not present

## 2020-08-29 DIAGNOSIS — Z79899 Other long term (current) drug therapy: Secondary | ICD-10-CM | POA: Insufficient documentation

## 2020-08-29 DIAGNOSIS — Z951 Presence of aortocoronary bypass graft: Secondary | ICD-10-CM | POA: Diagnosis not present

## 2020-08-29 DIAGNOSIS — E039 Hypothyroidism, unspecified: Secondary | ICD-10-CM | POA: Diagnosis not present

## 2020-08-29 DIAGNOSIS — Z7982 Long term (current) use of aspirin: Secondary | ICD-10-CM | POA: Insufficient documentation

## 2020-08-29 DIAGNOSIS — I10 Essential (primary) hypertension: Secondary | ICD-10-CM | POA: Diagnosis not present

## 2020-08-29 DIAGNOSIS — Z7901 Long term (current) use of anticoagulants: Secondary | ICD-10-CM | POA: Diagnosis not present

## 2020-08-29 MED ORDER — PREDNISONE 20 MG PO TABS
20.0000 mg | ORAL_TABLET | Freq: Every day | ORAL | 0 refills | Status: DC
  Filled 2020-08-29: qty 5, 5d supply, fill #0

## 2020-08-29 MED ORDER — LISINOPRIL 10 MG PO TABS
10.0000 mg | ORAL_TABLET | Freq: Every day | ORAL | 6 refills | Status: DC
  Filled 2020-08-29: qty 30, 30d supply, fill #0
  Filled 2020-09-18 – 2020-09-25 (×2): qty 30, 30d supply, fill #1
  Filled 2020-10-17 – 2020-10-31 (×3): qty 30, 30d supply, fill #2

## 2020-08-29 MED ORDER — ALLOPURINOL 100 MG PO TABS
100.0000 mg | ORAL_TABLET | Freq: Every day | ORAL | 6 refills | Status: DC
  Filled 2020-08-29: qty 30, 30d supply, fill #0
  Filled 2020-09-25: qty 30, 30d supply, fill #1
  Filled 2020-10-17 – 2020-10-31 (×3): qty 30, 30d supply, fill #2
  Filled 2021-01-08: qty 30, 30d supply, fill #3
  Filled 2021-02-18: qty 30, 30d supply, fill #4
  Filled 2021-03-29: qty 30, 30d supply, fill #0
  Filled 2021-05-08: qty 30, 30d supply, fill #1

## 2020-08-29 MED ORDER — ATORVASTATIN CALCIUM 40 MG PO TABS
ORAL_TABLET | Freq: Every day | ORAL | 6 refills | Status: DC
  Filled 2020-08-29: qty 30, fill #0
  Filled 2020-09-18: qty 30, 30d supply, fill #0
  Filled 2020-10-17: qty 30, 30d supply, fill #1
  Filled 2021-01-08: qty 30, 30d supply, fill #2
  Filled 2021-02-18: qty 30, 30d supply, fill #3
  Filled 2021-03-29: qty 30, 30d supply, fill #0
  Filled 2021-05-08: qty 30, 30d supply, fill #1

## 2020-08-29 NOTE — Progress Notes (Signed)
Subjective:  Patient ID: Danielle Houston, female    DOB: 08-09-1940  Age: 80 y.o. MRN: [id number]  CC: Hypertension   HPI Danielle Houston  is a 80 year old female CAD s/p CABG  (in [place]), hypothyroidism, prediabetes (A1c 5.7), essential hypertension, T9 compression fractures after a fall who presents today for a follow-up  Interval History: Accompanied by her son  today and she complains of pain in her R big toe with associated swelling which has been present for a while.  Pain sometimes affects her walking. She does have a history of Gout but is not on medications Her BP is elevated and she endorses compliance with her medications. Home BP readings have been in the 99991111 systolic. Compliant with her other medications.  Past Medical History:  Diagnosis Date   Coronary artery disease    Hypertension    Hypothyroidism     Past Surgical History:  Procedure Laterality Date   CARDIAC CATHETERIZATION     CORONARY ANGIOPLASTY      Family History  Problem Relation Age of Onset   CAD Neg Hx     No Known Allergies  Outpatient Medications Prior to Visit  Medication Sig Dispense Refill   aspirin EC 81 MG tablet Take 1 tablet (81 mg total) by mouth daily. Swallow whole. 30 tablet 11   carvedilol (COREG) 12.5 MG tablet TAKE 1 TABLET (12.5 MG TOTAL) BY MOUTH 2 (TWO) TIMES DAILY WITH A MEAL. 60 tablet 6   diclofenac Sodium (VOLTAREN) 1 % GEL APPLY 2 Grams TOPICALLY 4 (FOUR) TIMES DAILY as needed for PAIN 100 g 2   hydrALAZINE (APRESOLINE) 100 MG tablet TAKE 1 TABLET (100 MG TOTAL) BY MOUTH IN THE MORNING AND AT BEDTIME. 60 tablet 6   lidocaine (LIDODERM) 5 % Place 1 patch onto the skin daily. Remove & Discard patch within 12 hours or as directed by MD 30 patch 1   metFORMIN (GLUCOPHAGE) 500 MG tablet TAKE 0.5 TABLETS (250 MG TOTAL) BY MOUTH DAILY WITH BREAKFAST. 30 tablet 6   omeprazole (PRILOSEC) 20 MG capsule TAKE 1 CAPSULE (20 MG TOTAL) BY MOUTH DAILY. 90 capsule 1   atorvastatin (LIPITOR) 40 MG  tablet TAKE 1 TABLET (40 MG TOTAL) BY MOUTH DAILY. 30 tablet 6   lisinopril (ZESTRIL) 5 MG tablet TAKE 1 TABLET (5 MG TOTAL) BY MOUTH DAILY. 30 tablet 3   No facility-administered medications prior to visit.     ROS Review of Systems  Constitutional:  Negative for activity change, appetite change and fatigue.  HENT:  Negative for congestion, sinus pressure and sore throat.   Eyes:  Negative for visual disturbance.  Respiratory:  Negative for cough, chest tightness, shortness of breath and wheezing.   Cardiovascular:  Negative for chest pain and palpitations.  Gastrointestinal:  Negative for abdominal distention, abdominal pain and constipation.  Endocrine: Negative for polydipsia.  Genitourinary:  Negative for dysuria and frequency.  Musculoskeletal:        See HPI  Skin:  Negative for rash.  Neurological:  Negative for tremors, light-headedness and numbness.  Hematological:  Does not bruise/bleed easily.  Psychiatric/Behavioral:  Negative for agitation and behavioral problems.    Objective:  BP (!) 179/69   Pulse 60   Ht '5\' 2"'$  (1.575 m)   Wt 111 lb 12.8 oz (50.7 kg)   SpO2 98%   BMI 20.45 kg/m   BP/Weight 08/29/2020 [id number]  Systolic BP 0000000 123456 Q000111Q  Diastolic BP 69 67 64  Wt. (Lbs)  111.8 119 120  BMI 20.45 21.77 21.95      Physical Exam Constitutional:      Appearance: She is well-developed.  Neck:     Vascular: No JVD.  Cardiovascular:     Rate and Rhythm: Normal rate.     Heart sounds: Normal heart sounds. No murmur heard. Pulmonary:     Effort: Pulmonary effort is normal.     Breath sounds: Normal breath sounds. No wheezing or rales.  Chest:     Chest wall: No tenderness.  Abdominal:     General: Bowel sounds are normal. There is no distension.     Palpations: Abdomen is soft. There is no mass.     Tenderness: There is no abdominal tenderness.  Musculoskeletal:     Right lower leg: No edema.     Left lower leg: No edema.     Comments: R big  toe edema. No erythema, left foot is normal  Neurological:     Mental Status: She is alert and oriented to person, place, and time.  Psychiatric:        Mood and Affect: Mood normal.    CMP Latest Ref Rng & Units 04/24/2020 08/04/2019 08/03/2019  Glucose 65 - 99 mg/dL 165(H) 103(H) 182(H)  BUN 8 - 27 mg/dL '19 17 20  '$ Creatinine 0.57 - 1.00 mg/dL 1.38(H) 1.16(H) 1.26(H)  Sodium 134 - 144 mmol/L 139 142 141  Potassium 3.5 - 5.2 mmol/L 4.3 4.0 3.7  Chloride 96 - 106 mmol/L 103 106 106  CO2 20 - 29 mmol/L '22 28 25  '$ Calcium 8.7 - 10.3 mg/dL 9.2 9.1 9.4  Total Protein 6.5 - 8.1 g/dL - - 7.5  Total Bilirubin 0.3 - 1.2 mg/dL - - 0.6  Alkaline Phos 38 - 126 U/L - - 77  AST 15 - 41 U/L - - 22  ALT 0 - 44 U/L - - 22    Lipid Panel     Component Value Date/Time   CHOL 192 09/22/2019 0938   TRIG 216 (H) 09/22/2019 0938   HDL 54 09/22/2019 0938   CHOLHDL 3.6 09/22/2019 0938   LDLCALC 101 (H) 09/22/2019 0938    CBC    Component Value Date/Time   WBC 6.4 09/22/2019 0938   WBC 7.4 08/04/2019 0743   RBC 3.61 (L) 09/22/2019 0938   RBC 3.45 (L) 08/04/2019 0743   RBC 3.41 (L) 08/04/2019 0743   HGB 11.0 (L) 09/22/2019 0938   HCT 34.1 09/22/2019 0938   PLT 229 09/22/2019 0938   MCV 95 09/22/2019 0938   MCH 30.5 09/22/2019 0938   MCH 31.3 08/04/2019 0743   MCHC 32.3 09/22/2019 0938   MCHC 33.3 08/04/2019 0743   RDW 12.6 09/22/2019 0938   LYMPHSABS 2.5 09/22/2019 0938   EOSABS 0.1 09/22/2019 0938   BASOSABS 0.1 09/22/2019 0938    Lab Results  Component Value Date   HGBA1C 5.7 01/23/2020    Assessment & Plan:  1. Essential hypertension Uncontrolled Increase lisinopril dose We will check potassium level in 2 weeks - lisinopril (ZESTRIL) 10 MG tablet; Take 1 tablet (10 mg total) by mouth daily.  Dispense: 30 tablet; Refill: 6 - Basic Metabolic Panel; Future  2. Coronary artery disease involving native coronary artery of native heart with angina pectoris  (HCC) Stable Asymptomatic Risk factor modification - atorvastatin (LIPITOR) 40 MG tablet; TAKE 1 TABLET (40 MG TOTAL) BY MOUTH DAILY.  Dispense: 30 tablet; Refill: 6  3. Acute idiopathic gout involving toe of  right foot Symptoms suspicious for gout Will treat acute flare with prednisone Placed on allopurinol for prophylaxis - predniSONE (DELTASONE) 20 MG tablet; Take 1 tablet (20 mg total) by mouth daily with breakfast.  Dispense: 5 tablet; Refill: 0 - allopurinol (ZYLOPRIM) 100 MG tablet; Take 1 tablet (100 mg total) by mouth daily.  Dispense: 30 tablet; Refill: 6 - Uric Acid   Meds ordered this encounter  Medications   lisinopril (ZESTRIL) 10 MG tablet    Sig: Take 1 tablet (10 mg total) by mouth daily.    Dispense:  30 tablet    Refill:  6    Dose increase   predniSONE (DELTASONE) 20 MG tablet    Sig: Take 1 tablet (20 mg total) by mouth daily with breakfast.    Dispense:  5 tablet    Refill:  0   allopurinol (ZYLOPRIM) 100 MG tablet    Sig: Take 1 tablet (100 mg total) by mouth daily.    Dispense:  30 tablet    Refill:  6   atorvastatin (LIPITOR) 40 MG tablet    Sig: TAKE 1 TABLET (40 MG TOTAL) BY MOUTH DAILY.    Dispense:  30 tablet    Refill:  6    Follow-up: Return in about 3 months (around 11/29/2020) for Hypertension.       Charlott Rakes, MD, FAAFP. [facility] and [place], [place]   08/29/2020, 1:42 PM

## 2020-08-29 NOTE — Progress Notes (Signed)
Pain in toes.

## 2020-08-29 NOTE — Patient Instructions (Signed)
https://www.nhlbi.nih.gov/files/docs/public/heart/dash_brief.pdf">  DASH Eating Plan DASH stands for Dietary Approaches to Stop Hypertension. The DASH eating plan is a healthy eating plan that has been shown to: Reduce high blood pressure (hypertension). Reduce your risk for type 2 diabetes, heart disease, and stroke. Help with weight loss. What are tips for following this plan? Reading food labels Check food labels for the amount of salt (sodium) per serving. Choose foods with less than 5 percent of the Daily Value of sodium. Generally, foods with less than 300 milligrams (mg) of sodium per serving fit into this eating plan. To find whole grains, look for the word "whole" as the first word in the ingredient list. Shopping Buy products labeled as "low-sodium" or "no salt added." Buy fresh foods. Avoid canned foods and pre-made or frozen meals. Cooking Avoid adding salt when cooking. Use salt-free seasonings or herbs instead of table salt or sea salt. Check with your health care provider or pharmacist before using salt substitutes. Do not fry foods. Cook foods using healthy methods such as baking, boiling, grilling, roasting, and broiling instead. Cook with heart-healthy oils, such as olive, canola, avocado, soybean, or sunflower oil. Meal planning  Eat a balanced diet that includes: 4 or more servings of fruits and 4 or more servings of vegetables each day. Try to fill one-half of your plate with fruits and vegetables. 6-8 servings of whole grains each day. Less than 6 oz (170 g) of lean meat, poultry, or fish each day. A 3-oz (85-g) serving of meat is about the same size as a deck of cards. One egg equals 1 oz (28 g). 2-3 servings of low-fat dairy each day. One serving is 1 cup (237 mL). 1 serving of nuts, seeds, or beans 5 times each week. 2-3 servings of heart-healthy fats. Healthy fats called omega-3 fatty acids are found in foods such as walnuts, flaxseeds, fortified milks, and eggs.  These fats are also found in cold-water fish, such as sardines, salmon, and mackerel. Limit how much you eat of: Canned or prepackaged foods. Food that is high in trans fat, such as some fried foods. Food that is high in saturated fat, such as fatty meat. Desserts and other sweets, sugary drinks, and other foods with added sugar. Full-fat dairy products. Do not salt foods before eating. Do not eat more than 4 egg yolks a week. Try to eat at least 2 vegetarian meals a week. Eat more home-cooked food and less restaurant, buffet, and fast food.  Lifestyle When eating at a restaurant, ask that your food be prepared with less salt or no salt, if possible. If you drink alcohol: Limit how much you use to: 0-1 drink a day for women who are not pregnant. 0-2 drinks a day for men. Be aware of how much alcohol is in your drink. In the [place] one drink equals one 12 oz bottle of beer (355 mL), one 5 oz glass of wine (148 mL), or one 1 oz glass of hard liquor (44 mL). General information Avoid eating more than 2,300 mg of salt a day. If you have hypertension, you may need to reduce your sodium intake to 1,500 mg a day. Work with your health care provider to maintain a healthy body weight or to lose weight. Ask what an ideal weight is for you. Get at least 30 minutes of exercise that causes your heart to beat faster (aerobic exercise) most days of the week. Activities may include walking, swimming, or biking. Work with your health care provider   or dietitian to adjust your eating plan to your individual calorie needs. What foods should I eat? Fruits All fresh, dried, or frozen fruit. Canned fruit in natural juice (without addedsugar). Vegetables Fresh or frozen vegetables (raw, steamed, roasted, or grilled). Low-sodium or reduced-sodium tomato and vegetable juice. Low-sodium or reduced-sodium tomatosauce and tomato paste. Low-sodium or reduced-sodium canned vegetables. Grains Whole-grain or  whole-wheat bread. Whole-grain or whole-wheat pasta. Brown rice. Oatmeal. Quinoa. Bulgur. Whole-grain and low-sodium cereals. Pita bread.Low-fat, low-sodium crackers. Whole-wheat flour tortillas. Meats and other proteins Skinless chicken or turkey. Ground chicken or turkey. Pork with fat trimmed off. Fish and seafood. Egg whites. Dried beans, peas, or lentils. Unsalted nuts, nut butters, and seeds. Unsalted canned beans. Lean cuts of beef with fat trimmed off. Low-sodium, lean precooked or cured meat, such as sausages or meatloaves. Dairy Low-fat (1%) or fat-free (skim) milk. Reduced-fat, low-fat, or fat-free cheeses. Nonfat, low-sodium ricotta or cottage cheese. Low-fat or nonfatyogurt. Low-fat, low-sodium cheese. Fats and oils Soft margarine without trans fats. Vegetable oil. Reduced-fat, low-fat, or light mayonnaise and salad dressings (reduced-sodium). Canola, safflower, olive, avocado, soybean, andsunflower oils. Avocado. Seasonings and condiments Herbs. Spices. Seasoning mixes without salt. Other foods Unsalted popcorn and pretzels. Fat-free sweets. The items listed above may not be a complete list of foods and beverages you can eat. Contact a dietitian for more information. What foods should I avoid? Fruits Canned fruit in a light or heavy syrup. Fried fruit. Fruit in cream or buttersauce. Vegetables Creamed or fried vegetables. Vegetables in a cheese sauce. Regular canned vegetables (not low-sodium or reduced-sodium). Regular canned tomato sauce and paste (not low-sodium or reduced-sodium). Regular tomato and vegetable juice(not low-sodium or reduced-sodium). Pickles. Olives. Grains Baked goods made with fat, such as croissants, muffins, or some breads. Drypasta or rice meal packs. Meats and other proteins Fatty cuts of meat. Ribs. Fried meat. Bacon. Bologna, salami, and other precooked or cured meats, such as sausages or meat loaves. Fat from the back of a pig (fatback). Bratwurst.  Salted nuts and seeds. Canned beans with added salt. Canned orsmoked fish. Whole eggs or egg yolks. Chicken or turkey with skin. Dairy Whole or 2% milk, cream, and half-and-half. Whole or full-fat cream cheese. Whole-fat or sweetened yogurt. Full-fat cheese. Nondairy creamers. Whippedtoppings. Processed cheese and cheese spreads. Fats and oils Butter. Stick margarine. Lard. Shortening. Ghee. Bacon fat. Tropical oils, suchas coconut, palm kernel, or palm oil. Seasonings and condiments Onion salt, garlic salt, seasoned salt, table salt, and sea salt. Worcestershire sauce. Tartar sauce. Barbecue sauce. Teriyaki sauce. Soy sauce, including reduced-sodium. Steak sauce. Canned and packaged gravies. Fish sauce. Oyster sauce. Cocktail sauce. Store-bought horseradish. Ketchup. Mustard. Meat flavorings and tenderizers. Bouillon cubes. Hot sauces. Pre-made or packaged marinades. Pre-made or packaged taco seasonings. Relishes. Regular saladdressings. Other foods Salted popcorn and pretzels. The items listed above may not be a complete list of foods and beverages you should avoid. Contact a dietitian for more information. Where to find more information National Heart, Lung, and Blood Institute: www.nhlbi.nih.gov American Heart Association: www.heart.org Academy of Nutrition and Dietetics: www.eatright.org National Kidney Foundation: www.kidney.org Summary The DASH eating plan is a healthy eating plan that has been shown to reduce high blood pressure (hypertension). It may also reduce your risk for type 2 diabetes, heart disease, and stroke. When on the DASH eating plan, aim to eat more fresh fruits and vegetables, whole grains, lean proteins, low-fat dairy, and heart-healthy fats. With the DASH eating plan, you should limit salt (sodium) intake to 2,300   mg a day. If you have hypertension, you may need to reduce your sodium intake to 1,500 mg a day. Work with your health care provider or dietitian to adjust  your eating plan to your individual calorie needs. This information is not intended to replace advice given to you by your health care provider. Make sure you discuss any questions you have with your healthcare provider. Document Revised: 02/04/2019 Document Reviewed: 02/04/2019 Elsevier Patient Education  2022 Elsevier Inc.  

## 2020-08-30 ENCOUNTER — Other Ambulatory Visit: Payer: Self-pay

## 2020-08-30 LAB — URIC ACID: Uric Acid: 10.6 mg/dL — ABNORMAL HIGH (ref 3.1–7.9)

## 2020-09-11 ENCOUNTER — Telehealth: Payer: Self-pay

## 2020-09-11 NOTE — Telephone Encounter (Signed)
-----   Message from Charlott Rakes, MD sent at 08/30/2020  8:31 AM EDT ----- Please inform her labs are in keeping with Gout. Advise to reduce intake of red meat and sea food as these can cause flares. Thanks

## 2020-09-11 NOTE — Telephone Encounter (Signed)
Letter will be mailed to patient to contact office for results.

## 2020-09-18 ENCOUNTER — Other Ambulatory Visit: Payer: Self-pay

## 2020-09-18 MED FILL — Metformin HCl Tab 500 MG: ORAL | 30 days supply | Qty: 15 | Fill #3 | Status: AC

## 2020-09-18 MED FILL — Hydralazine HCl Tab 100 MG: ORAL | 30 days supply | Qty: 60 | Fill #3 | Status: AC

## 2020-09-25 ENCOUNTER — Other Ambulatory Visit: Payer: Self-pay

## 2020-09-25 ENCOUNTER — Other Ambulatory Visit: Payer: Self-pay | Admitting: Family Medicine

## 2020-09-25 DIAGNOSIS — M10071 Idiopathic gout, right ankle and foot: Secondary | ICD-10-CM

## 2020-09-25 NOTE — Telephone Encounter (Signed)
Requested medication (s) are due for refill today: yes  Requested medication (s) are on the active medication list: yes  Last refill:  08/29/20 #5  Future visit scheduled: yes  Notes to clinic:  med not delegated to NT to RF   Requested Prescriptions  Pending Prescriptions Disp Refills   predniSONE (DELTASONE) 20 MG tablet 5 tablet 0    Sig: Take 1 tablet (20 mg total) by mouth daily with breakfast.      Not Delegated - Endocrinology:  Oral Corticosteroids Failed - 09/25/2020  4:12 PM      Failed - This refill cannot be delegated      Failed - Last BP in normal range    BP Readings from Last 1 Encounters:  08/29/20 (!) 179/69          Passed - Valid encounter within last 6 months    Recent Outpatient Visits           3 weeks ago Acute idiopathic gout involving toe of right foot   [place], Charlane Ferretti, MD   3 months ago Tinnitus of both ears   [place], Charlane Ferretti, MD   5 months ago Essential hypertension   [place], Charlane Ferretti, MD   8 months ago [place], [place], MD   11 months ago Essential hypertension   [place], Enobong, MD       Future Appointments             In 2 months Charlott Rakes, MD [place]

## 2020-09-26 ENCOUNTER — Other Ambulatory Visit: Payer: Self-pay

## 2020-09-26 MED ORDER — PREDNISONE 20 MG PO TABS
20.0000 mg | ORAL_TABLET | Freq: Every day | ORAL | 0 refills | Status: DC
  Filled 2020-09-26 – 2020-10-17 (×2): qty 5, 5d supply, fill #0

## 2020-10-03 ENCOUNTER — Other Ambulatory Visit: Payer: Self-pay

## 2020-10-17 ENCOUNTER — Other Ambulatory Visit: Payer: Self-pay

## 2020-10-17 MED FILL — Hydralazine HCl Tab 100 MG: ORAL | 30 days supply | Qty: 60 | Fill #4 | Status: AC

## 2020-10-17 MED FILL — Metformin HCl Tab 500 MG: ORAL | 30 days supply | Qty: 15 | Fill #4 | Status: AC

## 2020-10-18 ENCOUNTER — Other Ambulatory Visit: Payer: Self-pay

## 2020-10-26 ENCOUNTER — Other Ambulatory Visit: Payer: Self-pay

## 2020-10-31 ENCOUNTER — Other Ambulatory Visit: Payer: Self-pay

## 2020-11-29 ENCOUNTER — Encounter: Payer: Self-pay | Admitting: Family Medicine

## 2020-11-29 ENCOUNTER — Other Ambulatory Visit: Payer: Self-pay

## 2020-11-29 ENCOUNTER — Ambulatory Visit: Payer: Medicaid Other | Attending: Family Medicine | Admitting: Family Medicine

## 2020-11-29 VITALS — BP 168/77 | HR 56 | Ht 62.0 in | Wt 104.0 lb

## 2020-11-29 DIAGNOSIS — Z7982 Long term (current) use of aspirin: Secondary | ICD-10-CM | POA: Diagnosis not present

## 2020-11-29 DIAGNOSIS — I10 Essential (primary) hypertension: Secondary | ICD-10-CM | POA: Diagnosis present

## 2020-11-29 DIAGNOSIS — Z7984 Long term (current) use of oral hypoglycemic drugs: Secondary | ICD-10-CM | POA: Diagnosis not present

## 2020-11-29 DIAGNOSIS — K5909 Other constipation: Secondary | ICD-10-CM | POA: Insufficient documentation

## 2020-11-29 DIAGNOSIS — R42 Dizziness and giddiness: Secondary | ICD-10-CM | POA: Diagnosis not present

## 2020-11-29 DIAGNOSIS — R7303 Prediabetes: Secondary | ICD-10-CM | POA: Diagnosis not present

## 2020-11-29 DIAGNOSIS — I2510 Atherosclerotic heart disease of native coronary artery without angina pectoris: Secondary | ICD-10-CM | POA: Insufficient documentation

## 2020-11-29 DIAGNOSIS — E039 Hypothyroidism, unspecified: Secondary | ICD-10-CM | POA: Diagnosis not present

## 2020-11-29 DIAGNOSIS — N814 Uterovaginal prolapse, unspecified: Secondary | ICD-10-CM | POA: Insufficient documentation

## 2020-11-29 DIAGNOSIS — Z951 Presence of aortocoronary bypass graft: Secondary | ICD-10-CM | POA: Diagnosis not present

## 2020-11-29 DIAGNOSIS — Z79899 Other long term (current) drug therapy: Secondary | ICD-10-CM | POA: Diagnosis not present

## 2020-11-29 LAB — POCT GLYCOSYLATED HEMOGLOBIN (HGB A1C): HbA1c, POC (controlled diabetic range): 5.6 % (ref 0.0–7.0)

## 2020-11-29 MED ORDER — HYDRALAZINE HCL 100 MG PO TABS
ORAL_TABLET | ORAL | 6 refills | Status: DC
  Filled 2020-11-29: qty 60, 30d supply, fill #0
  Filled 2021-01-08: qty 60, 30d supply, fill #1
  Filled 2021-02-18: qty 60, 30d supply, fill #2
  Filled 2021-03-29: qty 60, 30d supply, fill #0
  Filled 2021-05-08: qty 60, 30d supply, fill #1

## 2020-11-29 MED ORDER — MECLIZINE HCL 25 MG PO TABS
25.0000 mg | ORAL_TABLET | Freq: Every day | ORAL | 0 refills | Status: DC | PRN
  Filled 2020-11-29: qty 30, 30d supply, fill #0

## 2020-11-29 MED ORDER — POLYETHYLENE GLYCOL 3350 17 GM/SCOOP PO POWD
17.0000 g | Freq: Every day | ORAL | 1 refills | Status: DC
Start: 2020-11-29 — End: 2021-12-25
  Filled 2020-11-29 – 2021-03-29 (×2): qty 510, 30d supply, fill #0
  Filled 2021-05-08: qty 510, 30d supply, fill #1
  Filled 2021-08-02: qty 510, 30d supply, fill #2
  Filled 2021-09-02: qty 510, 30d supply, fill #3

## 2020-11-29 MED ORDER — LISINOPRIL 20 MG PO TABS
20.0000 mg | ORAL_TABLET | Freq: Every day | ORAL | 6 refills | Status: DC
  Filled 2020-11-29: qty 30, 30d supply, fill #0

## 2020-11-29 NOTE — Patient Instructions (Signed)
Vertigo Vertigo is the feeling that you or your surroundings are moving when they are not. This feeling can come and go at any time. Vertigo often goes away on its own. Vertigo can be dangerous if it occurs while you are doing something thatcould endanger yourself or others, such as driving or operating machinery. Your health care provider will do tests to try to determine the cause of your vertigo. Tests will also help your health care provider decide how best totreat your condition. Follow these instructions at home: Eating and drinking     Dehydration can make vertigo worse. Drink enough fluid to keep your urine pale yellow. Do not drink alcohol. Activity Return to your normal activities as told by your health care provider. Ask your health care provider what activities are safe for you. In the morning, first sit up on the side of the bed. When you feel okay, stand slowly while you hold onto something until you know that your balance is fine. Move slowly. Avoid sudden body or head movements or certain positions, as told by your health care provider. If you have trouble walking or keeping your balance, try using a cane for stability. If you feel dizzy or unstable, sit down right away. Avoid doing any tasks that would cause danger to you or others if vertigo occurs. Avoid bending down if you feel dizzy. Place items in your home so that they are easy for you to reach without bending or leaning over. Do not drive or use machinery if you feel dizzy. General instructions Take over-the-counter and prescription medicines only as told by your health care provider. Keep all follow-up visits. This is important. Contact a health care provider if: Your medicines do not relieve your vertigo or they make it worse. Your condition gets worse or you develop new symptoms. You have a fever. You develop nausea or vomiting, or if nausea gets worse. Your family or friends notice any behavioral changes. You  have numbness or a prickling and tingling sensation in part of your body. Get help right away if you: Are always dizzy or you faint. Develop severe headaches. Develop a stiff neck. Develop sensitivity to light. Have difficulty moving or speaking. Have weakness in your hands, arms, or legs. Have changes in your hearing or vision. These symptoms may represent a serious problem that is an emergency. Do not wait to see if the symptoms will go away. Get medical help right away. Call your local emergency services (911 in the [place] Do not drive yourself to the hospital. Summary Vertigo is the feeling that you or your surroundings are moving when they are not. Your health care provider will do tests to try to determine the cause of your vertigo. Follow instructions for home care. You may be told to avoid certain tasks, positions, or movements. Contact a health care provider if your medicines do not relieve your symptoms, or if you have a fever, nausea, vomiting, or changes in behavior. Get help right away if you have severe headaches or difficulty speaking, or you develop hearing or vision problems. This information is not intended to replace advice given to you by your health care provider. Make sure you discuss any questions you have with your healthcare provider. Document Revised: 02/01/2020 Document Reviewed: 02/01/2020 Elsevier Patient Education  2022 Elsevier Inc.  

## 2020-11-29 NOTE — Progress Notes (Signed)
Subjective:  Patient ID: Danielle Houston, female    DOB: 1940-07-17  Age: 80 y.o. MRN: [id number]  CC: Hypertension   HPI Danielle Houston is a 80 y.o. year old female with a history of CAD s/p CABG  (in [place]), hypothyroidism, prediabetes (A1c 5.7), essential hypertension, T9 compression fractures after a fall who presents today for a follow-up  Interval History: Over a year ago she noticed her uterus protruding and this is associated with pain.. She has also had constipation and is having to strain frequently to move her bowels. She complains of dizziness associated with changing position or turning her head.  Also has tinnitus.  Her blood pressure is elevated and she endorses compliance with her medications. She will need PCS service Currently lives with her son who works and she is by herself Past Medical History:  Diagnosis Date   Coronary artery disease    Hypertension    Hypothyroidism     Past Surgical History:  Procedure Laterality Date   CARDIAC CATHETERIZATION     CORONARY ANGIOPLASTY      Family History  Problem Relation Age of Onset   CAD Neg Hx     No Known Allergies  Outpatient Medications Prior to Visit  Medication Sig Dispense Refill   allopurinol (ZYLOPRIM) 100 MG tablet Take 1 tablet (100 mg total) by mouth daily. 30 tablet 6   aspirin EC 81 MG tablet Take 1 tablet (81 mg total) by mouth daily. Swallow whole. 30 tablet 11   atorvastatin (LIPITOR) 40 MG tablet TAKE 1 TABLET (40 MG TOTAL) BY MOUTH DAILY. 30 tablet 6   carvedilol (COREG) 12.5 MG tablet TAKE 1 TABLET (12.5 MG TOTAL) BY MOUTH 2 (TWO) TIMES DAILY WITH A MEAL. 60 tablet 6   diclofenac Sodium (VOLTAREN) 1 % GEL APPLY 2 Grams TOPICALLY 4 (FOUR) TIMES DAILY as needed for PAIN 100 g 2   lidocaine (LIDODERM) 5 % Place 1 patch onto the skin daily. Remove & Discard patch within 12 hours or as directed by MD 30 patch 1   metFORMIN (GLUCOPHAGE) 500 MG tablet TAKE 0.5 TABLETS (250 MG TOTAL) BY MOUTH DAILY WITH  BREAKFAST. 30 tablet 6   omeprazole (PRILOSEC) 20 MG capsule TAKE 1 CAPSULE (20 MG TOTAL) BY MOUTH DAILY. 90 capsule 1   hydrALAZINE (APRESOLINE) 100 MG tablet TAKE 1 TABLET (100 MG TOTAL) BY MOUTH IN THE MORNING AND AT BEDTIME. 60 tablet 6   lisinopril (ZESTRIL) 10 MG tablet Take 1 tablet (10 mg total) by mouth daily. 30 tablet 6   predniSONE (DELTASONE) 20 MG tablet Take 1 tablet (20 mg total) by mouth daily with breakfast. 5 tablet 0   No facility-administered medications prior to visit.     ROS Review of Systems  Constitutional:  Negative for activity change, appetite change and fatigue.  HENT:  Negative for congestion, sinus pressure and sore throat.   Eyes:  Negative for visual disturbance.  Respiratory:  Negative for cough, chest tightness, shortness of breath and wheezing.   Cardiovascular:  Negative for chest pain and palpitations.  Gastrointestinal:  Negative for abdominal distention, abdominal pain and constipation.  Endocrine: Negative for polydipsia.  Genitourinary:  Negative for dysuria and frequency.  Musculoskeletal:  Negative for arthralgias and back pain.  Skin:  Negative for rash.  Neurological:  Positive for dizziness. Negative for tremors, light-headedness and numbness.  Hematological:  Does not bruise/bleed easily.  Psychiatric/Behavioral:  Negative for agitation and behavioral problems.    Objective:  BP Marland Kitchen)  168/77   Pulse (!) 56   Ht '5\' 2"'$  (1.575 m)   Wt 104 lb (47.2 kg)   SpO2 100%   BMI 19.02 kg/m   BP/Weight 11/29/2020 08/29/2020 [id number]  Systolic BP [id number]  Diastolic BP 77 69 67  Wt. (Lbs) 104 111.8 119  BMI 19.02 20.45 21.77      Physical Exam Constitutional:      Appearance: She is well-developed.  Cardiovascular:     Rate and Rhythm: Normal rate.     Heart sounds: Normal heart sounds. No murmur heard. Pulmonary:     Effort: Pulmonary effort is normal.     Breath sounds: Normal breath sounds. No wheezing or rales.  Chest:      Chest wall: No tenderness.  Abdominal:     General: Bowel sounds are normal. There is no distension.     Palpations: Abdomen is soft. There is no mass.     Tenderness: There is no abdominal tenderness.  Genitourinary:    Comments: Uterine prolapse visible at vagina Musculoskeletal:        General: Normal range of motion.     Right lower leg: No edema.     Left lower leg: No edema.  Neurological:     Mental Status: She is alert and oriented to person, place, and time.  Psychiatric:        Mood and Affect: Mood normal.    CMP Latest Ref Rng & Units 04/24/2020 08/04/2019 08/03/2019  Glucose 65 - 99 mg/dL 165(H) 103(H) 182(H)  BUN 8 - 27 mg/dL '19 17 20  '$ Creatinine 0.57 - 1.00 mg/dL 1.38(H) 1.16(H) 1.26(H)  Sodium 134 - 144 mmol/L 139 142 141  Potassium 3.5 - 5.2 mmol/L 4.3 4.0 3.7  Chloride 96 - 106 mmol/L 103 106 106  CO2 20 - 29 mmol/L '22 28 25  '$ Calcium 8.7 - 10.3 mg/dL 9.2 9.1 9.4  Total Protein 6.5 - 8.1 g/dL - - 7.5  Total Bilirubin 0.3 - 1.2 mg/dL - - 0.6  Alkaline Phos 38 - 126 U/L - - 77  AST 15 - 41 U/L - - 22  ALT 0 - 44 U/L - - 22    Lipid Panel     Component Value Date/Time   CHOL 192 09/22/2019 0938   TRIG 216 (H) 09/22/2019 0938   HDL 54 09/22/2019 0938   CHOLHDL 3.6 09/22/2019 0938   LDLCALC 101 (H) 09/22/2019 0938    CBC    Component Value Date/Time   WBC 6.4 09/22/2019 0938   WBC 7.4 08/04/2019 0743   RBC 3.61 (L) 09/22/2019 0938   RBC 3.45 (L) 08/04/2019 0743   RBC 3.41 (L) 08/04/2019 0743   HGB 11.0 (L) 09/22/2019 0938   HCT 34.1 09/22/2019 0938   PLT 229 09/22/2019 0938   MCV 95 09/22/2019 0938   MCH 30.5 09/22/2019 0938   MCH 31.3 08/04/2019 0743   MCHC 32.3 09/22/2019 0938   MCHC 33.3 08/04/2019 0743   RDW 12.6 09/22/2019 0938   LYMPHSABS 2.5 09/22/2019 0938   EOSABS 0.1 09/22/2019 0938   BASOSABS 0.1 09/22/2019 0938    Lab Results  Component Value Date   HGBA1C 5.7 01/23/2020    Assessment & Plan:  1. Essential  hypertension Uncontrolled Lisinopril dose increased We will check potassium in 2 weeks - lisinopril (ZESTRIL) 20 MG tablet; Take 1 tablet (20 mg total) by mouth daily.  Dispense: 30 tablet; Refill: 6 - hydrALAZINE (APRESOLINE) 100 MG tablet; TAKE 1  TABLET (100 MG TOTAL) BY MOUTH IN THE MORNING AND AT BEDTIME.  Dispense: 60 tablet; Refill: 6 - Basic Metabolic Panel; Future  2. Pre-diabetes Controlled with A1c of 5.7  3. Uterine prolapse She will need placement of a pessary - Ambulatory referral to Gynecology  4. Other constipation Increase fiber intake, avoid white bread, white rice Placed on MiraLAX - polyethylene glycol powder (GLYCOLAX/MIRALAX) 17 GM/SCOOP powder; Take 17 g by mouth daily.  Dispense: 3350 g; Refill: 1  5. Vertigo Counseled on changing positions slowly Advised to exercise caution with use of meclizine especially in seniors - meclizine (ANTIVERT) 25 MG tablet; Take 1 tablet (25 mg total) by mouth daily as needed for dizziness.  Dispense: 30 tablet; Refill: 0    Meds ordered this encounter  Medications   lisinopril (ZESTRIL) 20 MG tablet    Sig: Take 1 tablet (20 mg total) by mouth daily.    Dispense:  30 tablet    Refill:  6    Dose increase   hydrALAZINE (APRESOLINE) 100 MG tablet    Sig: TAKE 1 TABLET (100 MG TOTAL) BY MOUTH IN THE MORNING AND AT BEDTIME.    Dispense:  60 tablet    Refill:  6   polyethylene glycol powder (GLYCOLAX/MIRALAX) 17 GM/SCOOP powder    Sig: Take 17 g by mouth daily.    Dispense:  3350 g    Refill:  1   meclizine (ANTIVERT) 25 MG tablet    Sig: Take 1 tablet (25 mg total) by mouth daily as needed for dizziness.    Dispense:  30 tablet    Refill:  0    Follow-up: Return in about 3 months (around 02/28/2021) for Medical conditions.       Charlott Rakes, MD, FAAFP. [facility] [place] [place], [place]   11/29/2020, 12:24 PM

## 2020-11-29 NOTE — Addendum Note (Signed)
Addended by: Gomez Cleverly on: 11/29/2020 01:37 PM   Modules accepted: Orders

## 2020-11-29 NOTE — Progress Notes (Signed)
Concerns about prolapse uterus.  Requesting script for walker or Rolator.

## 2020-12-12 ENCOUNTER — Telehealth: Payer: Self-pay | Admitting: Family Medicine

## 2020-12-12 NOTE — Telephone Encounter (Signed)
Copied from [place] [phone]. Topic: General - Other >> Dec 12, 2020  2:03 PM Leward Quan A wrote: Reason for CRM: [place] patient granddaughter called in to inform Dr Margarita Rana that the referral have not been received by the GYN office can it be resent please and someone call and inform her when it is done. Can be reached at  Ph# [phone]

## 2020-12-13 ENCOUNTER — Other Ambulatory Visit: Payer: Self-pay

## 2020-12-13 ENCOUNTER — Ambulatory Visit: Payer: Medicaid Other | Attending: Family Medicine

## 2020-12-13 DIAGNOSIS — I10 Essential (primary) hypertension: Secondary | ICD-10-CM

## 2020-12-13 NOTE — Telephone Encounter (Signed)
Grandaughter called  back and was given information and states she will make the appt.

## 2020-12-14 ENCOUNTER — Telehealth: Payer: Self-pay

## 2020-12-14 ENCOUNTER — Other Ambulatory Visit: Payer: Self-pay | Admitting: Family Medicine

## 2020-12-14 ENCOUNTER — Other Ambulatory Visit: Payer: Self-pay

## 2020-12-14 DIAGNOSIS — I10 Essential (primary) hypertension: Secondary | ICD-10-CM

## 2020-12-14 LAB — BASIC METABOLIC PANEL
BUN/Creatinine Ratio: 16 (ref 12–28)
BUN: 33 mg/dL — ABNORMAL HIGH (ref 8–27)
CO2: 19 mmol/L — ABNORMAL LOW (ref 20–29)
Calcium: 8.8 mg/dL (ref 8.7–10.3)
Chloride: 99 mmol/L (ref 96–106)
Creatinine, Ser: 2.04 mg/dL — ABNORMAL HIGH (ref 0.57–1.00)
Glucose: 108 mg/dL — ABNORMAL HIGH (ref 70–99)
Potassium: 4.3 mmol/L (ref 3.5–5.2)
Sodium: 131 mmol/L — ABNORMAL LOW (ref 134–144)
eGFR: 24 mL/min/{1.73_m2} — ABNORMAL LOW (ref >59)

## 2020-12-14 MED ORDER — AMLODIPINE BESYLATE 5 MG PO TABS
5.0000 mg | ORAL_TABLET | Freq: Every day | ORAL | 6 refills | Status: DC
  Filled 2020-12-14 – 2020-12-25 (×2): qty 30, 30d supply, fill #0

## 2020-12-14 MED ORDER — LISINOPRIL 10 MG PO TABS
10.0000 mg | ORAL_TABLET | Freq: Every day | ORAL | 6 refills | Status: DC
Start: 2020-12-14 — End: 2021-01-22
  Filled 2020-12-14 – 2020-12-25 (×2): qty 30, 30d supply, fill #0

## 2020-12-14 NOTE — Telephone Encounter (Signed)
Call placed to patient and there was no answer.

## 2020-12-14 NOTE — Telephone Encounter (Signed)
-----   Message from Charlott Rakes, MD sent at 12/14/2020  9:09 AM EDT ----- Please inform her that kidney function has decreased slightly which could be due to Lisinopril increase and so I have decreased from '20mg'$  to '10mg'$  and added Amlodipine '5mg'$  for better control of her blood pressure. Please schedule for repeat labs in 1 month.

## 2020-12-20 ENCOUNTER — Telehealth: Payer: Self-pay

## 2020-12-20 NOTE — Telephone Encounter (Signed)
Referral need to be changed.  Pre Dr.Miller this patient referral needs go to [facility] at [facility] Tyrone Apple, MD

## 2020-12-21 ENCOUNTER — Other Ambulatory Visit: Payer: Self-pay

## 2020-12-21 ENCOUNTER — Telehealth: Payer: Self-pay

## 2020-12-21 NOTE — Telephone Encounter (Signed)
No Vm picked up at this time.  CRM created.

## 2020-12-21 NOTE — Telephone Encounter (Signed)
-----   Message from Charlott Rakes, MD sent at 12/14/2020  9:09 AM EDT ----- Please inform her that kidney function has decreased slightly which could be due to Lisinopril increase and so I have decreased from '20mg'$  to '10mg'$  and added Amlodipine '5mg'$  for better control of her blood pressure. Please schedule for repeat labs in 1 month.

## 2020-12-25 ENCOUNTER — Other Ambulatory Visit: Payer: Self-pay

## 2021-01-08 ENCOUNTER — Other Ambulatory Visit: Payer: Self-pay

## 2021-01-08 MED FILL — Metformin HCl Tab 500 MG: ORAL | 30 days supply | Qty: 15 | Fill #5 | Status: AC

## 2021-01-21 ENCOUNTER — Ambulatory Visit: Payer: Medicaid Other | Attending: Family Medicine

## 2021-01-21 ENCOUNTER — Other Ambulatory Visit: Payer: Self-pay

## 2021-01-21 DIAGNOSIS — I10 Essential (primary) hypertension: Secondary | ICD-10-CM

## 2021-01-22 ENCOUNTER — Telehealth: Payer: Self-pay

## 2021-01-22 ENCOUNTER — Other Ambulatory Visit: Payer: Self-pay

## 2021-01-22 ENCOUNTER — Other Ambulatory Visit: Payer: Self-pay | Admitting: Family Medicine

## 2021-01-22 DIAGNOSIS — N1831 Chronic kidney disease, stage 3a: Secondary | ICD-10-CM

## 2021-01-22 LAB — CMP14+EGFR
ALT: 8 IU/L (ref 0–32)
AST: 13 IU/L (ref 0–40)
Albumin/Globulin Ratio: 1.7 (ref 1.2–2.2)
Albumin: 4.4 g/dL (ref 3.7–4.7)
Alkaline Phosphatase: 68 IU/L (ref 44–121)
BUN/Creatinine Ratio: 30 — ABNORMAL HIGH (ref 12–28)
BUN: 79 mg/dL (ref 8–27)
Bilirubin Total: 0.2 mg/dL (ref 0.0–1.2)
CO2: 16 mmol/L — ABNORMAL LOW (ref 20–29)
Calcium: 9.5 mg/dL (ref 8.7–10.3)
Chloride: 106 mmol/L (ref 96–106)
Creatinine, Ser: 2.61 mg/dL — ABNORMAL HIGH (ref 0.57–1.00)
Globulin, Total: 2.6 g/dL (ref 1.5–4.5)
Glucose: 112 mg/dL — ABNORMAL HIGH (ref 70–99)
Potassium: 5.5 mmol/L — ABNORMAL HIGH (ref 3.5–5.2)
Sodium: 135 mmol/L (ref 134–144)
Total Protein: 7 g/dL (ref 6.0–8.5)
eGFR: 18 mL/min/{1.73_m2} — ABNORMAL LOW (ref >59)

## 2021-01-22 MED ORDER — AMLODIPINE BESYLATE 10 MG PO TABS
10.0000 mg | ORAL_TABLET | Freq: Every day | ORAL | 3 refills | Status: DC
  Filled 2021-01-22 – 2021-03-29 (×3): qty 30, 30d supply, fill #0

## 2021-01-22 NOTE — Telephone Encounter (Signed)
Patient was called and no VM is set to leave a message.  CRM has been created and letter has been mailed to patient.

## 2021-01-22 NOTE — Telephone Encounter (Signed)
Pt returned call.  Please call patient back at [phone]

## 2021-01-22 NOTE — Telephone Encounter (Signed)
-----   Message from Charlott Rakes, MD sent at 01/22/2021  9:22 AM EST ----- Please inform her that her kidney function has worsened, potassium is elevated and I have discontinued lisinopril and increased amlodipine from 5 mg to 10 mg.  Please schedule her for repeat labs in 2 weeks.

## 2021-01-22 NOTE — Telephone Encounter (Signed)
Called pt 2x using interpreter # [id number]. Unable to leave a message as voice mail is not set up.

## 2021-01-25 ENCOUNTER — Telehealth: Payer: Self-pay

## 2021-01-25 NOTE — Telephone Encounter (Signed)
-----   Message from Charlott Rakes, MD sent at 01/22/2021  9:22 AM EST ----- Please inform her that her kidney function has worsened, potassium is elevated and I have discontinued lisinopril and increased amlodipine from 5 mg to 10 mg.  Please schedule her for repeat labs in 2 weeks.

## 2021-01-25 NOTE — Telephone Encounter (Signed)
Pt was called and informed of lab results and medication being sent to pharmacy.

## 2021-01-29 ENCOUNTER — Other Ambulatory Visit: Payer: Self-pay

## 2021-02-18 ENCOUNTER — Other Ambulatory Visit: Payer: Self-pay | Admitting: Family Medicine

## 2021-02-18 ENCOUNTER — Encounter: Payer: Medicaid Other | Admitting: Obstetrics and Gynecology

## 2021-02-18 ENCOUNTER — Other Ambulatory Visit: Payer: Self-pay

## 2021-02-18 DIAGNOSIS — R7303 Prediabetes: Secondary | ICD-10-CM

## 2021-02-18 NOTE — Telephone Encounter (Signed)
Last eGFR <30, contraindicative to metformin use. Will route to PCP to review.

## 2021-02-21 ENCOUNTER — Other Ambulatory Visit: Payer: Self-pay

## 2021-02-27 ENCOUNTER — Encounter: Payer: Self-pay | Admitting: Family Medicine

## 2021-02-27 ENCOUNTER — Other Ambulatory Visit: Payer: Self-pay | Admitting: Family Medicine

## 2021-02-27 ENCOUNTER — Other Ambulatory Visit: Payer: Self-pay

## 2021-02-27 ENCOUNTER — Ambulatory Visit: Payer: Medicaid Other | Attending: Family Medicine | Admitting: Family Medicine

## 2021-02-27 VITALS — BP 191/78 | HR 65 | Ht 62.0 in | Wt 101.0 lb

## 2021-02-27 DIAGNOSIS — R7303 Prediabetes: Secondary | ICD-10-CM

## 2021-02-27 DIAGNOSIS — K5909 Other constipation: Secondary | ICD-10-CM | POA: Diagnosis not present

## 2021-02-27 DIAGNOSIS — I10 Essential (primary) hypertension: Secondary | ICD-10-CM | POA: Diagnosis not present

## 2021-02-27 DIAGNOSIS — Z23 Encounter for immunization: Secondary | ICD-10-CM | POA: Diagnosis not present

## 2021-02-27 DIAGNOSIS — H6123 Impacted cerumen, bilateral: Secondary | ICD-10-CM

## 2021-02-27 DIAGNOSIS — N1832 Chronic kidney disease, stage 3b: Secondary | ICD-10-CM

## 2021-02-27 DIAGNOSIS — R42 Dizziness and giddiness: Secondary | ICD-10-CM

## 2021-02-27 DIAGNOSIS — K648 Other hemorrhoids: Secondary | ICD-10-CM

## 2021-02-27 MED ORDER — LUBIPROSTONE 8 MCG PO CAPS
8.0000 ug | ORAL_CAPSULE | Freq: Two times a day (BID) | ORAL | 3 refills | Status: DC
  Filled 2021-02-27: qty 60, 30d supply, fill #0
  Filled 2021-03-29: qty 30, 15d supply, fill #0
  Filled 2021-05-08: qty 60, 30d supply, fill #1
  Filled 2021-06-05: qty 30, 15d supply, fill #2

## 2021-02-27 MED ORDER — HYDROCORT-PRAMOXINE (PERIANAL) 2.5-1 % EX CREA
1.0000 "application " | TOPICAL_CREAM | Freq: Three times a day (TID) | CUTANEOUS | 0 refills | Status: DC
  Filled 2021-02-27: qty 30, 10d supply, fill #0

## 2021-02-27 NOTE — Progress Notes (Signed)
Subjective:  Patient ID: Danielle Houston, female    DOB: November 28, 1940  Age: 80 y.o. MRN: [id number]  CC: Hypertension   HPI Mariena Meares is a 80 y.o. year old female with a history of CAD s/p CABG  (in [place]), hypothyroidism, prediabetes (A1c 5.7), essential hypertension, T9 compression fractures after a fall who presents today for a follow-up. She is accompanied by her son to today's visit and is seen with the aid of a Mandarin [employer] interpreter.  Interval History: She complains of constipation for several years. I placed her on Miralax in the past which has not been effective. She has developed hemoirrhoids as a result.  Her BP is severely elevated. She does not have all her with her and states she has been taking just two medications - Carvedilol and Hydralazine, she does not have Amlodipine. For her prediabetes she has been on metformin however her GFR is 36. She also complains of tinnitus in both ears for an unknown duration. Past Medical History:  Diagnosis Date   Coronary artery disease    Hypertension    Hypothyroidism     Past Surgical History:  Procedure Laterality Date   CARDIAC CATHETERIZATION     CORONARY ANGIOPLASTY      Family History  Problem Relation Age of Onset   CAD Neg Hx     No Known Allergies  Outpatient Medications Prior to Visit  Medication Sig Dispense Refill   allopurinol (ZYLOPRIM) 100 MG tablet Take 1 tablet (100 mg total) by mouth daily. 30 tablet 6   amLODipine (NORVASC) 10 MG tablet Take 1 tablet (10 mg total) by mouth daily. 30 tablet 3   aspirin EC 81 MG tablet Take 1 tablet (81 mg total) by mouth daily. Swallow whole. 30 tablet 11   atorvastatin (LIPITOR) 40 MG tablet TAKE 1 TABLET (40 MG TOTAL) BY MOUTH DAILY. 30 tablet 6   carvedilol (COREG) 12.5 MG tablet TAKE 1 TABLET (12.5 MG TOTAL) BY MOUTH 2 (TWO) TIMES DAILY WITH A MEAL. 60 tablet 6   diclofenac Sodium (VOLTAREN) 1 % GEL APPLY 2 Grams TOPICALLY 4 (FOUR) TIMES DAILY as needed for PAIN 100  g 2   hydrALAZINE (APRESOLINE) 100 MG tablet TAKE 1 TABLET (100 MG TOTAL) BY MOUTH IN THE MORNING AND AT BEDTIME. 60 tablet 6   lidocaine (LIDODERM) 5 % Place 1 patch onto the skin daily. Remove & Discard patch within 12 hours or as directed by MD 30 patch 1   meclizine (ANTIVERT) 25 MG tablet Take 1 tablet (25 mg total) by mouth daily as needed for dizziness. 30 tablet 0   omeprazole (PRILOSEC) 20 MG capsule TAKE 1 CAPSULE (20 MG TOTAL) BY MOUTH DAILY. 90 capsule 1   polyethylene glycol powder (GLYCOLAX/MIRALAX) 17 GM/SCOOP powder Take 17 g by mouth daily. 3350 g 1   metFORMIN (GLUCOPHAGE) 500 MG tablet TAKE 0.5 TABLETS (250 MG TOTAL) BY MOUTH DAILY WITH BREAKFAST. 30 tablet 6   No facility-administered medications prior to visit.     ROS Review of Systems  Constitutional:  Negative for activity change, appetite change and fatigue.  HENT:  Positive for tinnitus. Negative for congestion, sinus pressure and sore throat.   Eyes:  Negative for visual disturbance.  Respiratory:  Negative for cough, chest tightness, shortness of breath and wheezing.   Cardiovascular:  Negative for chest pain and palpitations.  Gastrointestinal:  Positive for constipation. Negative for abdominal distention and abdominal pain.  Endocrine: Negative for polydipsia.  Genitourinary:  Negative for  dysuria and frequency.  Musculoskeletal:  Negative for arthralgias and back pain.  Skin:  Negative for rash.  Neurological:  Negative for tremors, light-headedness and numbness.  Hematological:  Does not bruise/bleed easily.  Psychiatric/Behavioral:  Negative for agitation and behavioral problems.    Objective:  BP (!) 191/78    Pulse 65    Ht 5' 2"  (1.575 m)    Wt 101 lb (45.8 kg)    SpO2 100%    BMI 18.47 kg/m   BP/Weight 02/27/2021 11/29/2020 0/09/6224  Systolic BP 333 545 625  Diastolic BP 78 77 69  Wt. (Lbs) 101 104 111.8  BMI 18.47 19.02 20.45      Physical Exam Constitutional:      Appearance: She is  well-developed.  HENT:     Right Ear: There is impacted cerumen.     Left Ear: There is impacted cerumen.  Cardiovascular:     Rate and Rhythm: Normal rate.     Heart sounds: Normal heart sounds. No murmur heard. Pulmonary:     Effort: Pulmonary effort is normal.     Breath sounds: Normal breath sounds. No wheezing or rales.  Chest:     Chest wall: No tenderness.  Abdominal:     General: Bowel sounds are normal. There is no distension.     Palpations: Abdomen is soft. There is no mass.     Tenderness: There is no abdominal tenderness.  Musculoskeletal:        General: Normal range of motion.     Right lower leg: No edema.     Left lower leg: No edema.  Neurological:     Mental Status: She is alert and oriented to person, place, and time.  Psychiatric:        Mood and Affect: Mood normal.    CMP Latest Ref Rng & Units 01/21/2021 12/13/2020 04/24/2020  Glucose 70 - 99 mg/dL 112(H) 108(H) 165(H)  BUN 8 - 27 mg/dL 79(HH) 33(H) 19  Creatinine 0.57 - 1.00 mg/dL 2.61(H) 2.04(H) 1.38(H)  Sodium 134 - 144 mmol/L 135 131(L) 139  Potassium 3.5 - 5.2 mmol/L 5.5(H) 4.3 4.3  Chloride 96 - 106 mmol/L 106 99 103  CO2 20 - 29 mmol/L 16(L) 19(L) 22  Calcium 8.7 - 10.3 mg/dL 9.5 8.8 9.2  Total Protein 6.0 - 8.5 g/dL 7.0 - -  Total Bilirubin 0.0 - 1.2 mg/dL 0.2 - -  Alkaline Phos 44 - 121 IU/L 68 - -  AST 0 - 40 IU/L 13 - -  ALT 0 - 32 IU/L 8 - -    Lipid Panel     Component Value Date/Time   CHOL 192 09/22/2019 0938   TRIG 216 (H) 09/22/2019 0938   HDL 54 09/22/2019 0938   CHOLHDL 3.6 09/22/2019 0938   LDLCALC 101 (H) 09/22/2019 0938    CBC    Component Value Date/Time   WBC 6.4 09/22/2019 0938   WBC 7.4 08/04/2019 0743   RBC 3.61 (L) 09/22/2019 0938   RBC 3.45 (L) 08/04/2019 0743   RBC 3.41 (L) 08/04/2019 0743   HGB 11.0 (L) 09/22/2019 0938   HCT 34.1 09/22/2019 0938   PLT 229 09/22/2019 0938   MCV 95 09/22/2019 0938   MCH 30.5 09/22/2019 0938   MCH 31.3 08/04/2019 0743    MCHC 32.3 09/22/2019 0938   MCHC 33.3 08/04/2019 0743   RDW 12.6 09/22/2019 0938   LYMPHSABS 2.5 09/22/2019 0938   EOSABS 0.1 09/22/2019 0938   BASOSABS 0.1  09/22/2019 1017    Lab Results  Component Value Date   HGBA1C 5.6 11/29/2020    Assessment & Plan:  1. Essential hypertension Uncontrolled She does not have her amlodipine with her I have provided them with a list for her medications to check with the pharmacy I will see her back at next visit to follow-up on her blood pressure  2. Pre-diabetes A1c is 5.6 Discontinue metformin due to GFR  3. Other constipation Advised to increase fiber intake and cut down on white bread - lubiprostone (AMITIZA) 8 MCG capsule; Take 1 capsule (8 mcg total) by mouth 2 (two) times daily with a meal.  Dispense: 30 capsule; Refill: 3 - hydrocortisone-pramoxine (ANALPRAM HC) 2.5-1 % rectal cream; Place 1 application rectally 3 (three) times daily.  Dispense: 30 g; Refill: 0  4. Bilateral impacted cerumen Offered to perform irrigation in the clinic as this could explain her tinnitus but she declines Advised to obtain OTC ceruminolytic's to this effect  5. 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 Other hemorrhoids Placed on Analpram  7. Need for pneumococcal vaccine - Pneumococcal conjugate vaccine 20-valent  8. Need for immunization against influenza - Flu Vaccine QUAD 39moIM (Fluarix, Fluzone & Alfiuria Quad PF)   Meds ordered this encounter  Medications   lubiprostone (AMITIZA) 8 MCG capsule    Sig: Take 1 capsule (8 mcg total) by mouth 2 (two) times daily with a meal.    Dispense:  30 capsule    Refill:  3   hydrocortisone-pramoxine (ANALPRAM HC) 2.5-1 % rectal cream    Sig: Place 1 application rectally 3 (three) times daily.    Dispense:  30 g    Refill:  0    Follow-up: Return in about 1 month (around 03/30/2021) for Blood Pressure follow-up.        ECharlott Rakes MD, FAAFP. [facility] [place]  02/27/2021, 12:58 PM

## 2021-02-27 NOTE — Patient Instructions (Signed)
Earwax Buildup, Adult ?The ears produce a substance called earwax that helps keep bacteria out of the ear and protects the skin in the ear canal. Occasionally, earwax can build up in the ear and cause discomfort or hearing loss. ?What are the causes? ?This condition is caused by a buildup of earwax. Ear canals are self-cleaning. Ear wax is made in the outer part of the ear canal and generally falls out in small amounts over time. ?When the self-cleaning mechanism is not working, earwax builds up and can cause decreased hearing and discomfort. Attempting to clean ears with cotton swabs can push the earwax deep into the ear canal and cause decreased hearing and pain. ?What increases the risk? ?This condition is more likely to develop in people who: ?Clean their ears often with cotton swabs. ?Pick at their ears. ?Use earplugs or in-ear headphones often, or wear hearing aids. ?The following factors may also make you more likely to develop this condition: ?Being female. ?Being of older age. ?Naturally producing more earwax. ?Having narrow ear canals. ?Having earwax that is overly thick or sticky. ?Having excess hair in the ear canal. ?Having eczema. ?Being dehydrated. ?What are the signs or symptoms? ?Symptoms of this condition include: ?Reduced or muffled hearing. ?A feeling of fullness in the ear or feeling that the ear is plugged. ?Fluid coming from the ear. ?Ear pain or an itchy ear. ?Ringing in the ear. ?Coughing. ?Balance problems. ?An obvious piece of earwax that can be seen inside the ear canal. ?How is this diagnosed? ?This condition may be diagnosed based on: ?Your symptoms. ?Your medical history. ?An ear exam. During the exam, your health care provider will look into your ear with an instrument called an otoscope. ?You may have tests, including a hearing test. ?How is this treated? ?This condition may be treated by: ?Using ear drops to soften the earwax. ?Having the earwax removed by a health care provider. The  health care provider may: ?Flush the ear with water. ?Use an instrument that has a loop on the end (curette). ?Use a suction device. ?Having surgery to remove the wax buildup. This may be done in severe cases. ?Follow these instructions at home: ? ?Take over-the-counter and prescription medicines only as told by your health care provider. ?Do not put any objects, including cotton swabs, into your ear. You can clean the opening of your ear canal with a washcloth or facial tissue. ?Follow instructions from your health care provider about cleaning your ears. Do not overclean your ears. ?Drink enough fluid to keep your urine pale yellow. This will help to thin the earwax. ?Keep all follow-up visits as told. If earwax builds up in your ears often or if you use hearing aids, consider seeing your health care provider for routine, preventive ear cleanings. Ask your health care provider how often you should schedule your cleanings. ?If you have hearing aids, clean them according to instructions from the manufacturer and your health care provider. ?Contact a health care provider if: ?You have ear pain. ?You develop a fever. ?You have pus or other fluid coming from your ear. ?You have hearing loss. ?You have ringing in your ears that does not go away. ?You feel like the room is spinning (vertigo). ?Your symptoms do not improve with treatment. ?Get help right away if: ?You have bleeding from the affected ear. ?You have severe ear pain. ?Summary ?Earwax can build up in the ear and cause discomfort or hearing loss. ?The most common symptoms of this condition include   reduced or muffled hearing, a feeling of fullness in the ear, or feeling that the ear is plugged. ?This condition may be diagnosed based on your symptoms, your medical history, and an ear exam. ?This condition may be treated by using ear drops to soften the earwax or by having the earwax removed by a health care provider. ?Do not put any objects, including cotton  swabs, into your ear. You can clean the opening of your ear canal with a washcloth or facial tissue. ?This information is not intended to replace advice given to you by your health care provider. Make sure you discuss any questions you have with your health care provider. ?Document Revised: 06/21/2019 Document Reviewed: 06/21/2019 ?Elsevier Patient Education ? 2022 Elsevier Inc. ? ?

## 2021-02-27 NOTE — Telephone Encounter (Signed)
Requested medication (s) are due for refill today - yes  Requested medication (s) are on the active medication list -yes  Future visit scheduled -yes  Last refill: 11/29/20 #30  Notes to clinic: Request RF: non delegated Rx  Requested Prescriptions  Pending Prescriptions Disp Refills   meclizine (ANTIVERT) 25 MG tablet 30 tablet 0    Sig: Take 1 tablet (25 mg total) by mouth daily as needed for dizziness.     Not Delegated - Gastroenterology: Antiemetics Failed - 02/27/2021 10:29 AM      Failed - This refill cannot be delegated      Passed - Valid encounter within last 6 months    Recent Outpatient Visits           Today Essential hypertension   [place], Enobong, MD   3 months ago [place], Charlane Ferretti, MD   6 months ago Acute idiopathic gout involving toe of right foot   [place], Enobong, MD   9 months ago Tinnitus of both ears   [place], Enobong, MD   10 months ago Essential hypertension   [place], Charlane Ferretti, MD       Future Appointments             In 1 month Charlott Rakes, MD [place]               Requested Prescriptions  Pending Prescriptions Disp Refills   meclizine (ANTIVERT) 25 MG tablet 30 tablet 0    Sig: Take 1 tablet (25 mg total) by mouth daily as needed for dizziness.     Not Delegated - Gastroenterology: Antiemetics Failed - 02/27/2021 10:29 AM      Failed - This refill cannot be delegated      Passed - Valid encounter within last 6 months    Recent Outpatient Visits           Today Essential hypertension   [place], Enobong, MD   3 months ago [place], Charlane Ferretti, MD   6 months ago Acute idiopathic  gout involving toe of right foot   [place], Enobong, MD   9 months ago Tinnitus of both ears   [place], Enobong, MD   10 months ago Essential hypertension   [place], MD       Future Appointments             In 1 month Charlott Rakes, MD [place]

## 2021-02-27 NOTE — Progress Notes (Signed)
Constipation 

## 2021-02-28 ENCOUNTER — Other Ambulatory Visit: Payer: Self-pay

## 2021-02-28 LAB — CMP14+EGFR
ALT: 9 IU/L (ref 0–32)
AST: 17 IU/L (ref 0–40)
Albumin/Globulin Ratio: 1.6 (ref 1.2–2.2)
Albumin: 4.7 g/dL (ref 3.7–4.7)
Alkaline Phosphatase: 77 IU/L (ref 44–121)
BUN/Creatinine Ratio: 33 — ABNORMAL HIGH (ref 12–28)
BUN: 71 mg/dL — ABNORMAL HIGH (ref 8–27)
Bilirubin Total: <0.2 mg/dL (ref 0.0–1.2)
CO2: 20 mmol/L (ref 20–29)
Calcium: 9.8 mg/dL (ref 8.7–10.3)
Chloride: 102 mmol/L (ref 96–106)
Creatinine, Ser: 2.12 mg/dL — ABNORMAL HIGH (ref 0.57–1.00)
Globulin, Total: 2.9 g/dL (ref 1.5–4.5)
Glucose: 122 mg/dL — ABNORMAL HIGH (ref 70–99)
Potassium: 4.7 mmol/L (ref 3.5–5.2)
Sodium: 137 mmol/L (ref 134–144)
Total Protein: 7.6 g/dL (ref 6.0–8.5)
eGFR: 23 mL/min/{1.73_m2} — ABNORMAL LOW (ref >59)

## 2021-02-28 MED ORDER — MECLIZINE HCL 25 MG PO TABS
25.0000 mg | ORAL_TABLET | Freq: Every day | ORAL | 0 refills | Status: DC | PRN
  Filled 2021-02-28 – 2021-03-29 (×2): qty 30, 30d supply, fill #0

## 2021-03-01 ENCOUNTER — Telehealth: Payer: Self-pay

## 2021-03-01 NOTE — Telephone Encounter (Signed)
-----   Message from Charlott Rakes, MD sent at 02/28/2021  1:06 PM EST ----- Potassium is normal and kidney function shows improvement compared to last set of labs.

## 2021-03-01 NOTE — Telephone Encounter (Signed)
Patient was called and a voicemail was left informing patient to return phone call for lab results.   CRM  created and letter has been mailed.

## 2021-03-01 NOTE — Telephone Encounter (Signed)
-----   Message from Charlott Rakes, MD sent at 02/28/2021  1:05 PM EST ----- Please inform the patient that labs are normal. Thank you.

## 2021-03-05 ENCOUNTER — Telehealth: Payer: Self-pay

## 2021-03-05 NOTE — Telephone Encounter (Signed)
Patient name and DOB has been verified Patient was informed of lab results. Patient had no questions.  

## 2021-03-05 NOTE — Telephone Encounter (Signed)
-----   Message from Charlott Rakes, MD sent at 02/28/2021  1:05 PM EST ----- Please inform the patient that labs are normal. Thank you.

## 2021-03-07 ENCOUNTER — Other Ambulatory Visit: Payer: Self-pay

## 2021-03-21 ENCOUNTER — Encounter: Payer: Medicaid Other | Admitting: Obstetrics and Gynecology

## 2021-03-21 ENCOUNTER — Telehealth: Payer: Self-pay | Admitting: Obstetrics and Gynecology

## 2021-03-21 NOTE — Telephone Encounter (Signed)
Called with interpreter Id number [phone], she left patient a detailed message that her appointment for today was not needed and she will get a call from urogynecology

## 2021-03-29 ENCOUNTER — Other Ambulatory Visit: Payer: Self-pay | Admitting: Family Medicine

## 2021-03-29 ENCOUNTER — Other Ambulatory Visit: Payer: Self-pay

## 2021-03-29 DIAGNOSIS — S22070D Wedge compression fracture of T9-T10 vertebra, subsequent encounter for fracture with routine healing: Secondary | ICD-10-CM

## 2021-03-29 MED ORDER — LIDOCAINE 5 % EX PTCH
1.0000 | MEDICATED_PATCH | CUTANEOUS | 1 refills | Status: DC
  Filled 2021-03-29 – 2021-05-08 (×2): qty 30, 30d supply, fill #0

## 2021-03-29 MED ORDER — DICLOFENAC SODIUM 1 % EX GEL
CUTANEOUS | 2 refills | Status: DC
  Filled 2021-03-29: qty 100, 12d supply, fill #0
  Filled 2021-05-08: qty 100, 12d supply, fill #1

## 2021-03-29 NOTE — Telephone Encounter (Signed)
Requested medication (s) are due for refill today -expired Rx  Requested medication (s) are on the active medication list -yes  Future visit scheduled -yes  Last refill: 09/21/19 #30 1RF  Notes to clinic: Request RF: expired Rx  Requested Prescriptions  Pending Prescriptions Disp Refills   lidocaine (LIDODERM) 5 % 30 patch 1    Sig: Place 1 patch onto the skin daily. Remove & Discard patch within 12 hours or as directed by MD     Analgesics:  Topicals Passed - 03/29/2021 11:04 AM      Passed - Valid encounter within last 12 months    Recent Outpatient Visits           1 month ago Essential hypertension   [place], Charlane Ferretti, MD   4 months ago [place], Charlane Ferretti, MD   7 months ago Acute idiopathic gout involving toe of right foot   [place], Enobong, MD   10 months ago Tinnitus of both ears   [place], Enobong, MD   11 months ago Essential hypertension   [place], Enobong, MD       Future Appointments             In 6 days Charlott Rakes, MD [place]   In 1 month Jaquita Folds, MD Urogynecology at [facility], [facility]            Signed Prescriptions Disp Refills   diclofenac Sodium (VOLTAREN) 1 % GEL 100 g 2    Sig: APPLY 2 Grams TOPICALLY 4 (FOUR) TIMES DAILY as needed for PAIN     Analgesics:  Topicals Passed - 03/29/2021 11:04 AM      Passed - Valid encounter within last 12 months    Recent Outpatient Visits           1 month ago Essential hypertension   [place], Charlane Ferretti, MD   4 months ago [place], Charlane Ferretti, MD   7 months ago Acute idiopathic gout involving toe of right foot   [place], Enobong, MD   10 months ago Tinnitus of both ears   [place], Enobong, MD   11 months ago Essential hypertension   [place], Enobong, MD       Future Appointments             In 6 days Charlott Rakes, MD [place]   In 1 month Jaquita Folds, MD Urogynecology at [facility], [facility]               Requested Prescriptions  Pending Prescriptions Disp Refills   lidocaine (LIDODERM) 5 % 30 patch 1    Sig: Place 1 patch onto the skin daily. Remove & Discard patch within 12 hours or as directed by MD     Analgesics:  Topicals Passed - 03/29/2021 11:04 AM      Passed - Valid encounter within last 12 months    Recent Outpatient Visits           1 month ago Essential hypertension   [place]  Charlott Rakes, MD   4 months ago [place], [place], MD   7 months ago Acute idiopathic gout involving toe of right foot   [place], Enobong, MD   10 months ago Tinnitus of both ears   [place], Enobong, MD   11 months ago Essential hypertension   [place], Enobong, MD       Future Appointments             In 6 days Charlott Rakes, MD [place]   In 1 month Jaquita Folds, MD Urogynecology at [facility], [facility]            Signed Prescriptions Disp Refills   diclofenac Sodium (VOLTAREN) 1 % GEL 100 g 2    Sig: APPLY 2 Grams TOPICALLY 4 (FOUR) TIMES DAILY as needed for PAIN     Analgesics:  Topicals Passed - 03/29/2021 11:04 AM      Passed - Valid encounter within last 12 months    Recent Outpatient Visits           1 month ago Essential hypertension    [place], Charlane Ferretti, MD   4 months ago [place], Charlane Ferretti, MD   7 months ago Acute idiopathic gout involving toe of right foot   [place], Enobong, MD   10 months ago Tinnitus of both ears   [place], Charlane Ferretti, MD   11 months ago Essential hypertension   [place], Enobong, MD       Future Appointments             In 6 days Charlott Rakes, MD [place]   In 1 month Wannetta Sender, Governor [place], MD Urogynecology at [facility], [facility]

## 2021-03-29 NOTE — Telephone Encounter (Signed)
Requested Prescriptions  Pending Prescriptions Disp Refills   lidocaine (LIDODERM) 5 % 30 patch 1    Sig: Place 1 patch onto the skin daily. Remove & Discard patch within 12 hours or as directed by MD     Analgesics:  Topicals Passed - 03/29/2021 11:04 AM      Passed - Valid encounter within last 12 months    Recent Outpatient Visits          1 month ago Essential hypertension   [place], Charlane Ferretti, MD   4 months ago [place], Charlane Ferretti, MD   7 months ago Acute idiopathic gout involving toe of right foot   [place], Enobong, MD   10 months ago Tinnitus of both ears   [place], Enobong, MD   11 months ago Essential hypertension   [place], Enobong, MD      Future Appointments            In 6 days Charlott Rakes, MD [place]   In 1 month Jaquita Folds, MD Urogynecology at [facility], [facility]            diclofenac Sodium (VOLTAREN) 1 % GEL 100 g 2    Sig: APPLY 2 Grams TOPICALLY 4 (FOUR) TIMES DAILY as needed for PAIN     Analgesics:  Topicals Passed - 03/29/2021 11:04 AM      Passed - Valid encounter within last 12 months    Recent Outpatient Visits          1 month ago Essential hypertension   [place], Charlane Ferretti, MD   4 months ago [place], Charlane Ferretti, MD   7 months ago Acute idiopathic gout involving toe of right foot   [place], Enobong, MD   10 months ago Tinnitus of both ears   [place], Charlane Ferretti, MD   11 months ago Essential hypertension   [place], Enobong, MD      Future Appointments             In 6 days Charlott Rakes, MD [place]   In 1 month Wannetta Sender, Governor Rooks, MD Urogynecology at [facility], [facility]

## 2021-04-01 ENCOUNTER — Other Ambulatory Visit: Payer: Self-pay

## 2021-04-04 ENCOUNTER — Encounter: Payer: Self-pay | Admitting: Family Medicine

## 2021-04-04 ENCOUNTER — Ambulatory Visit: Payer: Medicaid Other | Attending: Family Medicine | Admitting: Family Medicine

## 2021-04-04 ENCOUNTER — Other Ambulatory Visit: Payer: Self-pay

## 2021-04-04 VITALS — BP 144/60 | HR 65 | Ht 62.0 in | Wt 104.0 lb

## 2021-04-04 DIAGNOSIS — R600 Localized edema: Secondary | ICD-10-CM

## 2021-04-04 DIAGNOSIS — I10 Essential (primary) hypertension: Secondary | ICD-10-CM | POA: Diagnosis not present

## 2021-04-04 MED ORDER — ISOSORBIDE MONONITRATE ER 120 MG PO TB24
120.0000 mg | ORAL_TABLET | Freq: Every day | ORAL | 3 refills | Status: DC
Start: 2021-04-04 — End: 2021-08-02
  Filled 2021-04-04: qty 30, 30d supply, fill #0
  Filled 2021-05-08: qty 30, 30d supply, fill #1
  Filled 2021-06-05: qty 30, 30d supply, fill #2
  Filled 2021-07-04: qty 30, 30d supply, fill #3

## 2021-04-04 NOTE — Progress Notes (Signed)
Subjective:  Patient ID: Danielle Houston, female    DOB: 10-Mar-1941  Age: 81 y.o. MRN: [id number]  CC: Hypertension   HPI Danielle Houston is a 81 y.o. year old female with a history of CAD s/p CABG  (in [place]), hypothyroidism, prediabetes (A1c 5.7), essential hypertension, T9 compression fractures after a fall who presents today for a follow-up. She is accompanied by her son to today's visit and is seen with the aid of a Mandarin [place] interpreter  Interval History: She complains of pedal edema  which is better in the morning and worse as the day progresses.This was noticed within the last month and she denies excessive intake. Of note she is on Amlodipine.  She denies presence of weight gain, orthopnea, dyspnea. In the past we had to discontinue ACEI due to worsening renal function and hyperkalemia.  Past Medical History:  Diagnosis Date   Coronary artery disease    Hypertension    Hypothyroidism     Past Surgical History:  Procedure Laterality Date   CARDIAC CATHETERIZATION     CORONARY ANGIOPLASTY      Family History  Problem Relation Age of Onset   CAD Neg Hx     No Known Allergies  Outpatient Medications Prior to Visit  Medication Sig Dispense Refill   allopurinol (ZYLOPRIM) 100 MG tablet Take 1 tablet (100 mg total) by mouth daily. 30 tablet 6   aspirin EC 81 MG tablet Take 1 tablet (81 mg total) by mouth daily. Swallow whole. 30 tablet 11   atorvastatin (LIPITOR) 40 MG tablet TAKE 1 TABLET (40 MG TOTAL) BY MOUTH DAILY. 30 tablet 6   carvedilol (COREG) 12.5 MG tablet TAKE 1 TABLET (12.5 MG TOTAL) BY MOUTH 2 (TWO) TIMES DAILY WITH A MEAL. 60 tablet 6   diclofenac Sodium (VOLTAREN) 1 % GEL APPLY 2 Grams TOPICALLY 4 (FOUR) TIMES DAILY as needed for PAIN 100 g 2   hydrALAZINE (APRESOLINE) 100 MG tablet TAKE 1 TABLET (100 MG TOTAL) BY MOUTH IN THE MORNING AND AT BEDTIME. 60 tablet 6   hydrocortisone-pramoxine (ANALPRAM HC) 2.5-1 % rectal cream Place 1 application rectally 3  (three) times daily. 30 g 0   lidocaine (LIDODERM) 5 % Place 1 patch onto the skin daily. Remove & Discard patch within 12 hours or as directed by MD 30 patch 1   lubiprostone (AMITIZA) 8 MCG capsule Take 1 capsule (8 mcg total) by mouth 2 (two) times daily with a meal. 30 capsule 3   meclizine (ANTIVERT) 25 MG tablet Take 1 tablet (25 mg total) by mouth daily as needed for dizziness. 30 tablet 0   omeprazole (PRILOSEC) 20 MG capsule TAKE 1 CAPSULE (20 MG TOTAL) BY MOUTH DAILY. 90 capsule 1   polyethylene glycol powder (GLYCOLAX/MIRALAX) 17 GM/SCOOP powder Take 17 g by mouth daily. 3350 g 1   amLODipine (NORVASC) 10 MG tablet Take 1 tablet (10 mg total) by mouth daily. 30 tablet 3   No facility-administered medications prior to visit.     ROS Review of Systems  Constitutional:  Negative for activity change, appetite change and fatigue.  HENT:  Negative for congestion, sinus pressure and sore throat.   Eyes:  Negative for visual disturbance.  Respiratory:  Negative for cough, chest tightness, shortness of breath and wheezing.   Cardiovascular:  Positive for leg swelling. Negative for chest pain and palpitations.  Gastrointestinal:  Negative for abdominal distention, abdominal pain and constipation.  Endocrine: Negative for polydipsia.  Genitourinary:  Negative for dysuria  and frequency.  Musculoskeletal:  Negative for arthralgias and back pain.  Skin:  Negative for rash.  Neurological:  Negative for tremors, light-headedness and numbness.  Hematological:  Does not bruise/bleed easily.  Psychiatric/Behavioral:  Negative for agitation and behavioral problems.    Objective:  BP (!) 144/60    Pulse 65    Ht 5\' 2"  (1.575 m)    Wt 104 lb (47.2 kg)    SpO2 100%    BMI 19.02 kg/m   BP/Weight 04/04/2021 02/27/2021 1/61/0960  Systolic BP 454 098 119  Diastolic BP 60 78 77  Wt. (Lbs) 104 101 104  BMI 19.02 18.47 19.02      Physical Exam Constitutional:      Appearance: She is  well-developed.  Cardiovascular:     Rate and Rhythm: Normal rate.     Heart sounds: Normal heart sounds. No murmur heard. Pulmonary:     Effort: Pulmonary effort is normal.     Breath sounds: Normal breath sounds. No wheezing or rales.  Chest:     Chest wall: No tenderness.  Abdominal:     General: Bowel sounds are normal. There is no distension.     Palpations: Abdomen is soft. There is no mass.     Tenderness: There is no abdominal tenderness.  Musculoskeletal:        General: Normal range of motion.     Right lower leg: Edema present.     Left lower leg: Edema present.  Neurological:     Mental Status: She is alert and oriented to person, place, and time.  Psychiatric:        Mood and Affect: Mood normal.    CMP Latest Ref Rng & Units 02/27/2021 01/21/2021 12/13/2020  Glucose 70 - 99 mg/dL 122(H) 112(H) 108(H)  BUN 8 - 27 mg/dL 71(H) 79(HH) 33(H)  Creatinine 0.57 - 1.00 mg/dL 2.12(H) 2.61(H) 2.04(H)  Sodium 134 - 144 mmol/L 137 135 131(L)  Potassium 3.5 - 5.2 mmol/L 4.7 5.5(H) 4.3  Chloride 96 - 106 mmol/L 102 106 99  CO2 20 - 29 mmol/L 20 16(L) 19(L)  Calcium 8.7 - 10.3 mg/dL 9.8 9.5 8.8  Total Protein 6.0 - 8.5 g/dL 7.6 7.0 -  Total Bilirubin 0.0 - 1.2 mg/dL <0.2 0.2 -  Alkaline Phos 44 - 121 IU/L 77 68 -  AST 0 - 40 IU/L 17 13 -  ALT 0 - 32 IU/L 9 8 -    Lipid Panel     Component Value Date/Time   CHOL 192 09/22/2019 0938   TRIG 216 (H) 09/22/2019 0938   HDL 54 09/22/2019 0938   CHOLHDL 3.6 09/22/2019 0938   LDLCALC 101 (H) 09/22/2019 0938    CBC    Component Value Date/Time   WBC 6.4 09/22/2019 0938   WBC 7.4 08/04/2019 0743   RBC 3.61 (L) 09/22/2019 0938   RBC 3.45 (L) 08/04/2019 0743   RBC 3.41 (L) 08/04/2019 0743   HGB 11.0 (L) 09/22/2019 0938   HCT 34.1 09/22/2019 0938   PLT 229 09/22/2019 0938   MCV 95 09/22/2019 0938   MCH 30.5 09/22/2019 0938   MCH 31.3 08/04/2019 0743   MCHC 32.3 09/22/2019 0938   MCHC 33.3 08/04/2019 0743   RDW 12.6  09/22/2019 0938   LYMPHSABS 2.5 09/22/2019 0938   EOSABS 0.1 09/22/2019 0938   BASOSABS 0.1 09/22/2019 0938    Lab Results  Component Value Date   HGBA1C 5.6 11/29/2020    Assessment & Plan:  Problem List Items Addressed This Visit       Cardiovascular and Mediastinum   Essential hypertension - Primary    Slightly above goal but improved from previous visit Due to complaint of pedal edema with amlodipine I have discontinued amlodipine and initiated isosorbide. ACE inhibitor was discontinued in the past due to hyperkalemia and worsening renal function with resulting improvement once medication was discontinued I will hold off on a diuretic to prevent worsening renal function Reassess BP at next visit Counseled on blood pressure goal of less than 130/80, low-sodium, DASH diet, medication compliance, 150 minutes of moderate intensity exercise per week. Discussed medication compliance, adverse effects.       Relevant Medications   isosorbide mononitrate (IMDUR) 120 MG 24 hr tablet   Other Visit Diagnoses     Pedal edema      Dependent edema but amlodipine also contributing as symptoms have been present for the last 1 month Will discontinue inciting agent-amlodipine Encouraged to comply with a low-sodium diet, elevate feet, use compression stockings       Meds ordered this encounter  Medications   isosorbide mononitrate (IMDUR) 120 MG 24 hr tablet    Sig: Take 1 tablet (120 mg total) by mouth daily.    Dispense:  30 tablet    Refill:  3    Discontinue amlodipine    Follow-up: Return in about 1 month (around 05/05/2021) for Blood Pressure follow-up.       Charlott Rakes, MD, FAAFP. [facility] and [place], [place]   04/04/2021, 10:30 AM

## 2021-04-04 NOTE — Patient Instructions (Signed)
Edema ?Edema is when you have too much fluid in your body or under your skin. Edema may make your legs, feet, and ankles swell. Swelling often happens in looser tissues, such as around your eyes. This is a common condition. It gets more common as you get older. ?There are many possible causes of edema. These include: ?Eating too much salt (sodium). ?Being on your feet or sitting for a long time. ?Certain medical conditions, such as: ?Pregnancy. ?Heart failure. ?Liver disease. ?Kidney disease. ?Cancer. ?Hot weather may make edema worse. Edema is usually painless. Your skin may look swollen or shiny. ?Follow these instructions at home: ?Medicines ?Take over-the-counter and prescription medicines only as told by your doctor. ?Your doctor may prescribe a medicine to help your body get rid of extra water (diuretic). Take this medicine if you are told to take it. ?Eating and drinking ?Eat a low-salt (low-sodium) diet as told by your doctor. Sometimes, eating less salt may reduce swelling. ?Depending on the cause of your swelling, you may need to limit how much fluid you drink (fluid restriction). ?General instructions ?Raise the injured area above the level of your heart while you are sitting or lying down. ?Do not sit still or stand for a long time. ?Do not wear tight clothes. Do not wear garters on your upper legs. ?Exercise your legs. This can help the swelling go down. ?Wear compression stockings as told by your doctor. It is important that these are the right size. These should be prescribed by your doctor to prevent possible injuries. ?If elastic bandages or wraps are recommended, use them as told by your doctor. ?Contact a doctor if: ?Treatment is not working. ?You have heart, liver, or kidney disease and have symptoms of edema. ?You have sudden and unexplained weight gain. ?Get help right away if: ?You have shortness of breath or chest pain. ?You cannot breathe when you lie down. ?You have pain, redness, or warmth  in the swollen areas. ?You have heart, liver, or kidney disease and get edema all of a sudden. ?You have a fever and your symptoms get worse all of a sudden. ?These symptoms may be an emergency. Get help right away. Call 911. ?Do not wait to see if the symptoms will go away. ?Do not drive yourself to the hospital. ?Summary ?Edema is when you have too much fluid in your body or under your skin. ?Edema may make your legs, feet, and ankles swell. Swelling often happens in looser tissues, such as around your eyes. ?Raise the injured area above the level of your heart while you are sitting or lying down. ?Follow your doctor's instructions about diet and how much fluid you can drink. ?This information is not intended to replace advice given to you by your health care provider. Make sure you discuss any questions you have with your health care provider. ?Document Revised: 11/05/2020 Document Reviewed: 11/05/2020 ?Elsevier Patient Education ? 2022 Elsevier Inc. ? ?

## 2021-04-04 NOTE — Progress Notes (Signed)
Swelling in feet.

## 2021-04-04 NOTE — Assessment & Plan Note (Signed)
Slightly above goal but improved from previous visit Due to complaint of pedal edema with amlodipine I have discontinued amlodipine and initiated isosorbide. ACE inhibitor was discontinued in the past due to hyperkalemia and worsening renal function with resulting improvement once medication was discontinued I will hold off on a diuretic to prevent worsening renal function Reassess BP at next visit Counseled on blood pressure goal of less than 130/80, low-sodium, DASH diet, medication compliance, 150 minutes of moderate intensity exercise per week. Discussed medication compliance, adverse effects.

## 2021-05-08 ENCOUNTER — Other Ambulatory Visit: Payer: Self-pay

## 2021-05-08 ENCOUNTER — Encounter: Payer: Self-pay | Admitting: Physician Assistant

## 2021-05-08 ENCOUNTER — Other Ambulatory Visit: Payer: Self-pay | Admitting: Family Medicine

## 2021-05-08 ENCOUNTER — Ambulatory Visit (INDEPENDENT_AMBULATORY_CARE_PROVIDER_SITE_OTHER): Payer: Medicaid Other | Admitting: Physician Assistant

## 2021-05-08 ENCOUNTER — Ambulatory Visit: Payer: Medicaid Other | Admitting: Family Medicine

## 2021-05-08 VITALS — BP 170/70 | HR 64 | Resp 12 | Ht 61.0 in | Wt 103.0 lb

## 2021-05-08 DIAGNOSIS — I10 Essential (primary) hypertension: Secondary | ICD-10-CM

## 2021-05-08 DIAGNOSIS — M79671 Pain in right foot: Secondary | ICD-10-CM | POA: Diagnosis not present

## 2021-05-08 DIAGNOSIS — K5909 Other constipation: Secondary | ICD-10-CM

## 2021-05-08 DIAGNOSIS — I25119 Atherosclerotic heart disease of native coronary artery with unspecified angina pectoris: Secondary | ICD-10-CM | POA: Diagnosis not present

## 2021-05-08 DIAGNOSIS — M79672 Pain in left foot: Secondary | ICD-10-CM

## 2021-05-08 DIAGNOSIS — R42 Dizziness and giddiness: Secondary | ICD-10-CM

## 2021-05-08 NOTE — Patient Instructions (Signed)
We will call you with medication instructions / changes    Please check BP at home twice a day, keep a written log and have available for all office visits.   Kennieth Rad, PA-C Physician Assistant [facility] Medicine [email or web address]

## 2021-05-08 NOTE — Progress Notes (Signed)
Established Patient Office Visit  Subjective:  Patient ID: Danielle Houston, female    DOB: 09/09/1940  Age: 81 y.o. MRN: [id number]  CC:  Chief Complaint  Patient presents with   Hypertension    HPI Danielle Houston was seen by her PCP on 04/04/21  Plan from that visit:   Cardiovascular and Mediastinum    Essential hypertension - Primary      Slightly above goal but improved from previous visit Due to complaint of pedal edema with amlodipine I have discontinued amlodipine and initiated isosorbide. ACE inhibitor was discontinued in the past due to hyperkalemia and worsening renal function with resulting improvement once medication was discontinued I will hold off on a diuretic to prevent worsening renal function Reassess BP at next visit Counseled on blood pressure goal of less than 130/80, low-sodium, DASH diet, medication compliance, 150 minutes of moderate intensity exercise per week. Discussed medication compliance, adverse effects.          Relevant Medications    isosorbide mononitrate (IMDUR) 120 MG 24 hr tablet    Other Visit Diagnoses       Pedal edema      Dependent edema but amlodipine also contributing as symptoms have been present for the last 1 month Will discontinue inciting agent-amlodipine Encouraged to comply with a low-sodium diet, elevate feet, use compression stockings    States today that she has not been checking her BP at home. States that she does have a BP cuff at home.  States that the swelling in her feet  has improved since discontinuing the amlodipine.  States that her feet get "achy" at night and it is hard to fall asleep - states that this has been present for "awhile" but does seem to be worse since stopping  her amlodipine. States that she has not tried anything for relief.  States that she is sometimes "sleeping a lot and sometimes not very much"  states that she is getting about 5-6 hours a night.  States that she has been having some dizziness,  "occasionally sometimes dizzy" states that it has been present for the past year, but does feel it has become worse in the 3 days.  States that she is eating and drinking okay, is drinking approx   Due to language barrier, an interpreter was present during the history-taking and subsequent discussion (and for part of the physical exam) with this patient.  Note: Despite interpretation, and son present to help with history, patient is a reluctant historian    Past Medical History:  Diagnosis Date   Coronary artery disease    Hypertension    Hypothyroidism     Past Surgical History:  Procedure Laterality Date   CARDIAC CATHETERIZATION     CORONARY ANGIOPLASTY      Family History  Problem Relation Age of Onset   CAD Neg Hx     Social History   Socioeconomic History   Marital status: Single    Spouse name: Not on file   Number of children: Not on file   Years of education: Not on file   Highest education level: Not on file  Occupational History   Not on file  Tobacco Use   Smoking status: Never   Smokeless tobacco: Never  Substance and Sexual Activity   Alcohol use: Never   Drug use: Never   Sexual activity: Not on file  Other Topics Concern   Not on file  Social History Narrative   Not on file  Social Determinants of Health   Financial Resource Strain: Not on file  Food Insecurity: Not on file  Transportation Needs: Not on file  Physical Activity: Not on file  Stress: Not on file  Social Connections: Not on file  Intimate Partner Violence: Not on file    Outpatient Medications Prior to Visit  Medication Sig Dispense Refill   allopurinol (ZYLOPRIM) 100 MG tablet Take 1 tablet (100 mg total) by mouth daily. 30 tablet 6   aspirin EC 81 MG tablet Take 1 tablet (81 mg total) by mouth daily. Swallow whole. 30 tablet 11   atorvastatin (LIPITOR) 40 MG tablet TAKE 1 TABLET (40 MG TOTAL) BY MOUTH DAILY. 30 tablet 6   carvedilol (COREG) 12.5 MG tablet TAKE 1 TABLET  (12.5 MG TOTAL) BY MOUTH 2 (TWO) TIMES DAILY WITH A MEAL. 60 tablet 6   hydrALAZINE (APRESOLINE) 100 MG tablet TAKE 1 TABLET (100 MG TOTAL) BY MOUTH IN THE MORNING AND AT BEDTIME. 60 tablet 6   isosorbide mononitrate (IMDUR) 120 MG 24 hr tablet Take 1 tablet (120 mg total) by mouth daily. 30 tablet 3   omeprazole (PRILOSEC) 20 MG capsule TAKE 1 CAPSULE (20 MG TOTAL) BY MOUTH DAILY. 90 capsule 1   diclofenac Sodium (VOLTAREN) 1 % GEL APPLY 2 Grams TOPICALLY 4 (FOUR) TIMES DAILY as needed for PAIN 100 g 2   hydrocortisone-pramoxine (ANALPRAM HC) 2.5-1 % rectal cream Place 1 application rectally 3 (three) times daily. 30 g 0   lidocaine (LIDODERM) 5 % Place 1 patch onto the skin daily. Remove & Discard patch within 12 hours or as directed by MD 30 patch 1   lubiprostone (AMITIZA) 8 MCG capsule Take 1 capsule (8 mcg total) by mouth 2 (two) times daily with a meal. 30 capsule 3   meclizine (ANTIVERT) 25 MG tablet Take 1 tablet (25 mg total) by mouth daily as needed for dizziness. 30 tablet 0   polyethylene glycol powder (GLYCOLAX/MIRALAX) 17 GM/SCOOP powder Take 17 g by mouth daily. 3350 g 1   No facility-administered medications prior to visit.    No Known Allergies  ROS Review of Systems  Constitutional: Negative.   HENT: Negative.    Eyes: Negative.   Respiratory:  Negative for shortness of breath.   Cardiovascular:  Negative for chest pain and leg swelling.  Gastrointestinal: Negative.   Endocrine: Negative.   Genitourinary: Negative.   Musculoskeletal: Negative.   Skin: Negative.   Allergic/Immunologic: Negative.   Neurological:  Positive for dizziness. Negative for syncope, speech difficulty, weakness and headaches.  Hematological: Negative.   Psychiatric/Behavioral:  Positive for sleep disturbance.      Objective:    Physical Exam Vitals and nursing note reviewed.  Constitutional:      General: She is not in acute distress.    Appearance: Normal appearance. She is not  ill-appearing.  HENT:     Head: Normocephalic and atraumatic.     Right Ear: External ear normal.     Left Ear: External ear normal.     Nose: Nose normal.  Eyes:     Extraocular Movements: Extraocular movements intact.     Conjunctiva/sclera: Conjunctivae normal.     Pupils: Pupils are equal, round, and reactive to light.  Cardiovascular:     Rate and Rhythm: Normal rate and regular rhythm.     Pulses: Normal pulses.          Dorsalis pedis pulses are 2+ on the right side and 2+ on the left side.  Posterior tibial pulses are 2+ on the right side and 2+ on the left side.     Heart sounds: Normal heart sounds.  Pulmonary:     Effort: Pulmonary effort is normal.     Breath sounds: Normal breath sounds.  Musculoskeletal:        General: Normal range of motion.     Cervical back: Normal range of motion and neck supple.     Right lower leg: No edema.     Left lower leg: No edema.  Skin:    General: Skin is warm and dry.  Neurological:     General: No focal deficit present.     Mental Status: She is alert and oriented to person, place, and time.  Psychiatric:        Mood and Affect: Mood normal.        Behavior: Behavior normal.        Thought Content: Thought content normal.        Judgment: Judgment normal.    BP (!) 170/70 (BP Location: Left Arm, Patient Position: Sitting, Cuff Size: Normal) Comment: took medication 30 minutes ago.   Pulse 64    Resp 12    Ht 5' 1"  (1.549 m)    Wt 103 lb (46.7 kg)    LMP  (LMP Unknown)    SpO2 98%    BMI 19.46 kg/m  Wt Readings from Last 3 Encounters:  05/08/21 103 lb (46.7 kg)  04/04/21 104 lb (47.2 kg)  02/27/21 101 lb (45.8 kg)     Health Maintenance Due  Topic Date Due   COVID-19 Vaccine (1) Never done   Zoster Vaccines- Shingrix (1 of 2) Never done   DEXA SCAN  Never done    There are no preventive care reminders to display for this patient.  Lab Results  Component Value Date   TSH 2.180 09/22/2019   Lab Results   Component Value Date   WBC 6.4 09/22/2019   HGB 11.0 (L) 09/22/2019   HCT 34.1 09/22/2019   MCV 95 09/22/2019   PLT 229 09/22/2019   Lab Results  Component Value Date   NA 137 02/27/2021   K 4.7 02/27/2021   CO2 20 02/27/2021   GLUCOSE 122 (H) 02/27/2021   BUN 71 (H) 02/27/2021   CREATININE 2.12 (H) 02/27/2021   BILITOT <0.2 02/27/2021   ALKPHOS 77 02/27/2021   AST 17 02/27/2021   ALT 9 02/27/2021   PROT 7.6 02/27/2021   ALBUMIN 4.7 02/27/2021   CALCIUM 9.8 02/27/2021   ANIONGAP 8 08/04/2019   EGFR 23 (L) 02/27/2021   Lab Results  Component Value Date   CHOL 192 09/22/2019   Lab Results  Component Value Date   HDL 54 09/22/2019   Lab Results  Component Value Date   LDLCALC 101 (H) 09/22/2019   Lab Results  Component Value Date   TRIG 216 (H) 09/22/2019   Lab Results  Component Value Date   CHOLHDL 3.6 09/22/2019   Lab Results  Component Value Date   HGBA1C 5.6 11/29/2020      Assessment & Plan:   Problem List Items Addressed This Visit       Cardiovascular and Mediastinum   CAD (coronary artery disease)   Essential hypertension - Primary   Other Visit Diagnoses     Bilateral foot pain           No orders of the defined types were placed in this encounter. 1. Essential hypertension Patient  is currently taking Apresoline 100 mg twice daily, Imdur 120 mg once daily, and carvedilol 12.5 mg twice daily.  Blood pressure not at goal.  Encouraged patient to check blood pressure at home on a daily basis, keep a written log and have available for all office visits.  This provider did consult with Dr. Margarita Rana, and she recommended to increase Apresoline to 3 times daily.  Patient will be scheduled for 1 month [facility]  2. Bilateral foot pain   3. Coronary artery disease involving native coronary artery of native heart with angina pectoris ([place])    I have reviewed the patient's  medical history (PMH, PSH, Social History, Family History, Medications, and allergies) , and have been updated if relevant. I spent 30 minutes reviewing chart and  face to face time with patient.     Follow-up: Return for needs Dr. Margarita Rana appt in 1 month / or Lurena Joiner if apt not available .    Loraine Grip Mayers, PA-C

## 2021-05-08 NOTE — Progress Notes (Signed)
F/u blood pressure  Insomnia and fatigue and a little bit of dizziness  Numbness in legs at night.   Uterine prolaspe- bleeding noticed a couple days ago.  Appt on 05/14/2021 at 2:20.

## 2021-05-09 ENCOUNTER — Other Ambulatory Visit: Payer: Self-pay

## 2021-05-09 ENCOUNTER — Telehealth: Payer: Self-pay | Admitting: *Deleted

## 2021-05-09 MED ORDER — MECLIZINE HCL 25 MG PO TABS
25.0000 mg | ORAL_TABLET | Freq: Every day | ORAL | 0 refills | Status: DC | PRN
  Filled 2021-05-09: qty 30, 30d supply, fill #0

## 2021-05-09 MED ORDER — HYDRALAZINE HCL 100 MG PO TABS
100.0000 mg | ORAL_TABLET | Freq: Three times a day (TID) | ORAL | 6 refills | Status: DC
  Filled 2021-05-09 – 2021-06-05 (×2): qty 90, 30d supply, fill #0
  Filled 2021-07-04: qty 90, 30d supply, fill #1
  Filled 2021-08-02: qty 90, 30d supply, fill #2
  Filled 2021-09-02: qty 90, 30d supply, fill #3
  Filled 2021-10-04: qty 90, 30d supply, fill #4
  Filled 2021-11-01: qty 90, 30d supply, fill #5
  Filled 2021-12-02 (×2): qty 90, 30d supply, fill #6

## 2021-05-09 MED ORDER — HYDROCORT-PRAMOXINE (PERIANAL) 2.5-1 % EX CREA
1.0000 "application " | TOPICAL_CREAM | Freq: Three times a day (TID) | CUTANEOUS | 0 refills | Status: DC
  Filled 2021-05-09: qty 30, 10d supply, fill #0

## 2021-05-09 NOTE — Telephone Encounter (Signed)
Medical Assistant used [employer] to contact patient.  Interpreter Name: Eda Keys Interpreter #: [id number] MA spoke with patients son and shared the new dosing of hydralazine being sent to [facility] pharmacy to take TID and to check BP at home and bring to visit with PCP next month.

## 2021-05-09 NOTE — Telephone Encounter (Signed)
Requested medication (s) are due for refill today:   Provider to review for Antivert,   Yes for Analpram-HC cream  Requested medication (s) are on the active medication list:   Yes for both  Future visit scheduled:   Yes   Last ordered: Antivert 02/28/2021 #30, 0 refills - non delegated;   Analpram 02/27/2021 30 g, 0 refills - no protocol assigned to this medication.     Requested Prescriptions  Pending Prescriptions Disp Refills   meclizine (ANTIVERT) 25 MG tablet 30 tablet 0    Sig: Take 1 tablet (25 mg total) by mouth daily as needed for dizziness.     Not Delegated - Gastroenterology: Antiemetics Failed - 05/08/2021  1:35 PM      Failed - This refill cannot be delegated      Passed - Valid encounter within last 6 months    Recent Outpatient Visits           1 month ago Essential hypertension   [place], Enobong, MD   2 months ago Essential hypertension   [place], Enobong, MD   5 months ago [place], Charlane Ferretti, MD   8 months ago Acute idiopathic gout involving toe of right foot   [place], Enobong, MD   11 months ago Tinnitus of both ears   [place], Enobong, MD       Future Appointments             In 5 days Jaquita Folds, MD Urogynecology at [facility], [facility]   In 3 weeks Charlott Rakes, MD [place]             hydrocortisone-pramoxine [facility]) 2.5-1 % rectal cream 30 g 0    Sig: Place 1 application rectally 3 (three) times daily.     Off-Protocol Failed - 05/08/2021  1:35 PM      Failed - Medication not assigned to a protocol, review manually.      Passed - Valid encounter within last 12 months    Recent Outpatient Visits           1 month ago Essential hypertension   [place], Enobong, MD   2 months ago Essential hypertension   [place], Enobong, MD   5 months ago [place], Charlane Ferretti, MD   8 months ago Acute idiopathic gout involving toe of right foot   [place], Enobong, MD   11 months ago Tinnitus of both ears   [place], Enobong, MD       Future Appointments             In 5 days Wannetta Sender, Governor [place]ook[place], MD Urogynecology at [facility], [facility]   In 3 weeks Charlott Rakes, MD [place]

## 2021-05-10 ENCOUNTER — Other Ambulatory Visit: Payer: Self-pay

## 2021-05-14 ENCOUNTER — Ambulatory Visit (INDEPENDENT_AMBULATORY_CARE_PROVIDER_SITE_OTHER): Payer: Medicaid Other | Admitting: Obstetrics and Gynecology

## 2021-05-14 ENCOUNTER — Encounter: Payer: Self-pay | Admitting: Obstetrics and Gynecology

## 2021-05-14 ENCOUNTER — Other Ambulatory Visit: Payer: Self-pay

## 2021-05-14 VITALS — BP 185/73 | HR 70 | Wt 103.0 lb

## 2021-05-14 DIAGNOSIS — N950 Postmenopausal bleeding: Secondary | ICD-10-CM | POA: Diagnosis not present

## 2021-05-14 DIAGNOSIS — N812 Incomplete uterovaginal prolapse: Secondary | ICD-10-CM | POA: Diagnosis not present

## 2021-05-14 DIAGNOSIS — R350 Frequency of micturition: Secondary | ICD-10-CM | POA: Diagnosis not present

## 2021-05-14 DIAGNOSIS — N8110 Cystocele, unspecified: Secondary | ICD-10-CM | POA: Diagnosis not present

## 2021-05-14 DIAGNOSIS — N816 Rectocele: Secondary | ICD-10-CM

## 2021-05-14 LAB — POCT URINALYSIS DIPSTICK
Appearance: NORMAL
Bilirubin, UA: NEGATIVE
Blood, UA: NEGATIVE
Glucose, UA: NEGATIVE
Ketones, UA: NEGATIVE
Leukocytes, UA: NEGATIVE
Nitrite, UA: NEGATIVE
Protein, UA: NEGATIVE
Spec Grav, UA: 1.01 (ref 1.010–1.025)
Urobilinogen, UA: 0.2 E.U./dL
pH, UA: 5.5 (ref 5.0–8.0)

## 2021-05-14 NOTE — Progress Notes (Signed)
[facility] New Patient Evaluation and Consultation  Referring Provider: Charlott Rakes, MD PCP: Charlott Rakes, MD Date of Service: 05/14/2021  SUBJECTIVE Chief Complaint: New Patient (Initial Visit) Danielle Houston is a 81 y.o. female here for a prolapse evaluation.)  History of Present Illness: Danielle Houston is a 81 y.o. Asian female seen in consultation at the request of Dr. Margarita Rana for evaluation of prolapse.    Review of records from Dr Margarita Rana significant for: Has noticed her uterus protruding. Having to strain more frequently with constipation.  Visit completed with Mandarin interpreter  Urinary Symptoms: Leaks urine with going from sitting to standing Leaks occasionally Pad use: 1 adult diapers per day.   She is bothered by her UI symptoms.  Day time voids 5-6.  Nocturia: 2-3 times per night to void. Voiding dysfunction: she empties her bladder well.  does not use a catheter to empty bladder.  When urinating, she feels to push on her belly or vagina to empty bladder  UTIs:  0  UTI's in the last year.   Denies history of blood in urine and kidney or bladder stones  Pelvic Organ Prolapse Symptoms:                  She Admits to a feeling of a bulge the vaginal area. It has been present for years, becoming more uncomfortable. She Admits to seeing a bulge.  This bulge is bothersome.  Bowel Symptom: Bowel movements: 1 time(s) per day Stool consistency: hard Straining: yes.  Splinting: yes.  Incomplete evacuation: yes.  She Denies accidental bowel leakage / fecal incontinence Bowel regimen: none Last colonoscopy: Date- about 2 years ago, Results- normal  Sexual Function Sexually active: no.   Pelvic Pain Denies pelvic pain    Past Medical History:  Past Medical History:  Diagnosis Date   Coronary artery disease    Hypertension    Hypothyroidism      Past Surgical History:   Past Surgical History:  Procedure Laterality Date   CARDIAC  CATHETERIZATION     CORONARY ANGIOPLASTY       Past OB/GYN History: OB History  Gravida Para Term Preterm AB Living  6         6  SAB IAB Ectopic Multiple Live Births          6    # Outcome Date GA Lbr Len/2nd Weight Sex Delivery Anes PTL Lv  6 Gravida           5 Gravida           4 Gravida           3 Gravida           2 Gravida           1 Gravida             Vaginal deliveries: 6 Menopausal: Yes, Admits to vaginal bleeding since menopause- will occasionally see some blood in diaper Any history of abnormal pap smears: no.   Medications: She has a current medication list which includes the following prescription(s): allopurinol, aspirin ec, atorvastatin, carvedilol, diclofenac sodium, hydralazine, isosorbide mononitrate, lubiprostone, omeprazole, polyethylene glycol powder, and [DISCONTINUED] gabapentin.   Allergies: Patient has No Known Allergies.   Social History:  Social History   Tobacco Use   Smoking status: Never   Smokeless tobacco: Never  Substance Use Topics   Alcohol use: Never   Drug use: Never    Relationship status: widowed She lives with  son.   She is not employed. Regular exercise: No History of abuse: No  Family History:   Family History  Problem Relation Age of Onset   CAD Neg Hx      Review of Systems: Review of Systems  Constitutional:  Positive for malaise/fatigue and weight loss. Negative for fever.  Respiratory:  Negative for cough, shortness of breath and wheezing.   Cardiovascular:  Positive for chest pain. Negative for palpitations and leg swelling.  Gastrointestinal:  Negative for abdominal pain and blood in stool.  Genitourinary:  Negative for dysuria.  Musculoskeletal:  Positive for myalgias.  Skin:  Negative for rash.  Neurological:  Positive for dizziness and headaches.  Endo/Heme/Allergies:  Bruises/bleeds easily.  Psychiatric/Behavioral:  Negative for depression. The patient is not nervous/anxious.      OBJECTIVE Physical Exam: Vitals:   05/14/21 1447  BP: (!) 185/73  Pulse: 70  Weight: 103 lb (46.7 kg)    Physical Exam   GU / Detailed Urogynecologic Evaluation:  Pelvic Exam: Normal external female genitalia; Bartholin's and Skene's glands normal in appearance; urethral meatus normal in appearance, no urethral masses or discharge.   CST: negative   Speculum exam reveals normal vaginal mucosa with atrophy. Cervix normal appearance. Uterus normal single, nontender. Adnexa no mass, fullness, tenderness.     Pelvic floor strength I/V  Pelvic floor musculature: Right levator non-tender, Right obturator non-tender, Left levator non-tender, Left obturator non-tender  POP-Q:   POP-Q  0                                            Aa   0                                           Ba  1.5                                              C   4.5                                            Gh  3                                            Pb  8                                            tvl   -2                                            Ap  -2  Bp  -6                                              D     Rectal Exam:  Normal external rectum  Post-Void Residual (PVR) by Bladder Scan: In order to evaluate bladder emptying, we discussed obtaining a postvoid residual and she agreed to this procedure.  Procedure: The ultrasound unit was placed on the patient's abdomen in the suprapubic region after the patient had voided. A PVR of 5 ml was obtained by bladder scan.  Laboratory Results: POC urine: negative   ASSESSMENT AND PLAN [name] is a 81 y.o. with:  1. Uterovaginal prolapse, incomplete   2. Prolapse of anterior vaginal wall   3. Prolapse of posterior vaginal wall   4. Urinary frequency   5. Post-menopausal bleeding    Stage II anterior, Stage I posterior, Stage III apical prolapse - For treatment of pelvic organ  prolapse, we discussed options for management including expectant management, conservative management, and surgical management, such as Kegels, a pessary, pelvic floor physical therapy, and specific surgical procedures. - Due to age and comorbidities, recommend pessary placement. She will return for a pessary fitting.   2. PMB - pelvic US ordered to assess endometrium due to post menopausal bleeding  Return for pessary fitting   Jaquita Folds, MD   Medical Decision Making:  - Reviewed/ ordered a clinical laboratory test - Reviewed/ ordered a radiologic study - Review and summation of prior records

## 2021-05-15 ENCOUNTER — Encounter: Payer: Self-pay | Admitting: Obstetrics and Gynecology

## 2021-05-17 ENCOUNTER — Other Ambulatory Visit: Payer: Self-pay

## 2021-05-24 ENCOUNTER — Other Ambulatory Visit: Payer: Self-pay

## 2021-05-24 ENCOUNTER — Ambulatory Visit (HOSPITAL_COMMUNITY)
Admission: RE | Admit: 2021-05-24 | Discharge: 2021-05-24 | Disposition: A | Discharge status: Home / Self Care | Payer: Medicaid Other | Source: Ambulatory Visit | Attending: Obstetrics and Gynecology | Admitting: Obstetrics and Gynecology

## 2021-05-24 DIAGNOSIS — N950 Postmenopausal bleeding: Secondary | ICD-10-CM | POA: Diagnosis present

## 2021-06-04 ENCOUNTER — Encounter: Payer: Self-pay | Admitting: Obstetrics and Gynecology

## 2021-06-04 ENCOUNTER — Ambulatory Visit (INDEPENDENT_AMBULATORY_CARE_PROVIDER_SITE_OTHER): Payer: Medicaid Other | Admitting: Obstetrics and Gynecology

## 2021-06-04 ENCOUNTER — Other Ambulatory Visit: Payer: Self-pay

## 2021-06-04 VITALS — BP 209/77 | HR 81

## 2021-06-04 DIAGNOSIS — N812 Incomplete uterovaginal prolapse: Secondary | ICD-10-CM

## 2021-06-04 DIAGNOSIS — N950 Postmenopausal bleeding: Secondary | ICD-10-CM

## 2021-06-04 DIAGNOSIS — N816 Rectocele: Secondary | ICD-10-CM | POA: Diagnosis not present

## 2021-06-04 DIAGNOSIS — N8110 Cystocele, unspecified: Secondary | ICD-10-CM

## 2021-06-04 NOTE — Progress Notes (Signed)
[facility] ? ? ?Subjective:  ?  ? ?Chief Complaint: Pessary fitting ? ?History of Present Illness: ?Danielle Houston is a 81 y.o. female with stage III pelvic organ prolapse who presents today for a pessary fitting.  ? ?We reviewed the results of her TVUS:  ?1. Endometrial thickness of 3.1 mm. In the setting of ?post-menopausal bleeding, this is consistent with a benign etiology ?such as endometrial atrophy ? ?Past Medical History: ?Danielle Houston  has a past medical history of Coronary artery disease, Hypertension, and Hypothyroidism.  ? ?Past Surgical History: ?She  has a past surgical history that includes Cardiac catheterization and Coronary angioplasty.  ? ?Medications: ?She has a current medication list which includes the following prescription(s): allopurinol, aspirin ec, atorvastatin, carvedilol, diclofenac sodium, hydralazine, isosorbide mononitrate, lubiprostone, omeprazole, polyethylene glycol powder, and [DISCONTINUED] gabapentin.  ? ?Allergies: ?Danielle Houston has No Known Allergies.  ? ?Social History: ?Danielle Houston  reports that she has never smoked. She has never used smokeless tobacco. She reports that she does not drink alcohol and does not use drugs.  ? ?  ? ?Objective:  ?  ?BP (!) 209/77   Pulse 81   LMP  (LMP Unknown)  ?Gen: No apparent distress, A&O x 3. ?Pelvic Exam: Normal external female genitalia; Bartholin's and Skene's glands normal in appearance; urethral meatus normal in appearance, no urethral masses or discharge.  ? ?A size 2-1/2in short stem gellhorn pessary was fitted. It was comfortable, stayed in place with valsalva and was an appropriate size on examination, with one finger fitting between the pessary and the vaginal walls. Lot# [id number] ? ?POP-Q:  ?  ?POP-Q ?  ?0  ?                                          Aa   ?0 ?                                          Ba   ?1.5  ?                                            C  ?  ?4.5  ?                                          Gh   ?3  ?                                           Pb   ?8  ?                                          tvl  ?  ?-2  ?  Ap   ?-2  ?                                          Bp   ?-6  ?                                            D  ?  ?  ? ? ?Assessment/Plan:  ?  ?Assessment: ?Danielle Houston is a 81 y.o. with stage III pelvic organ prolapse who presents for a pessary fitting. ? ?Plan: ?- She was fitted with a #4 (2-1/2in) short stem gellhorn pessary. She will keep the pessary in place until next visit. ?- No concern on ultrasound for bleeding. Likely due to vaginal atrophy.  ?- She has a follow up tomorrow with her PCP regarding her blood pressure.  ? ? ?Follow-up in 3-4 weeks for a pessary check or sooner as needed.  ? ?  ? ?Jaquita Folds, MD ? ?Time spent: I spent 15 minutes dedicated to the care of this Danielle Houston on the date of this encounter to include pre-visit review of records, face-to-face time with the Danielle Houston discussing ultrasound and post visit documentation. ? ?

## 2021-06-05 ENCOUNTER — Ambulatory Visit: Payer: Medicaid Other | Attending: Family Medicine | Admitting: Family Medicine

## 2021-06-05 ENCOUNTER — Encounter: Payer: Self-pay | Admitting: Family Medicine

## 2021-06-05 ENCOUNTER — Other Ambulatory Visit: Payer: Self-pay

## 2021-06-05 DIAGNOSIS — I25119 Atherosclerotic heart disease of native coronary artery with unspecified angina pectoris: Secondary | ICD-10-CM | POA: Diagnosis not present

## 2021-06-05 DIAGNOSIS — I129 Hypertensive chronic kidney disease with stage 1 through stage 4 chronic kidney disease, or unspecified chronic kidney disease: Secondary | ICD-10-CM

## 2021-06-05 DIAGNOSIS — M10071 Idiopathic gout, right ankle and foot: Secondary | ICD-10-CM

## 2021-06-05 DIAGNOSIS — M542 Cervicalgia: Secondary | ICD-10-CM

## 2021-06-05 DIAGNOSIS — N183 Chronic kidney disease, stage 3 unspecified: Secondary | ICD-10-CM

## 2021-06-05 MED ORDER — DICLOFENAC SODIUM 1 % EX GEL
CUTANEOUS | 2 refills | Status: DC
  Filled 2021-06-05: qty 100, 12d supply, fill #0
  Filled 2021-07-04: qty 100, 12d supply, fill #1
  Filled 2021-08-02: qty 100, 12d supply, fill #2

## 2021-06-05 MED ORDER — ALLOPURINOL 100 MG PO TABS
100.0000 mg | ORAL_TABLET | Freq: Every day | ORAL | 6 refills | Status: DC
  Filled 2021-06-05 (×2): qty 30, 30d supply, fill #0
  Filled 2021-07-04: qty 30, 30d supply, fill #1
  Filled 2021-08-02: qty 30, 30d supply, fill #2
  Filled 2021-09-02: qty 30, 30d supply, fill #3
  Filled 2021-10-04: qty 30, 30d supply, fill #4
  Filled 2021-11-01: qty 30, 30d supply, fill #5
  Filled 2021-12-02: qty 30, 30d supply, fill #6

## 2021-06-05 MED ORDER — CARVEDILOL 12.5 MG PO TABS
ORAL_TABLET | Freq: Two times a day (BID) | ORAL | 6 refills | Status: DC
  Filled 2021-06-05: qty 60, 30d supply, fill #0
  Filled 2021-07-04: qty 60, 30d supply, fill #1
  Filled 2021-08-02: qty 60, 30d supply, fill #2
  Filled 2021-09-02: qty 60, 30d supply, fill #3
  Filled 2021-10-04: qty 60, 30d supply, fill #4
  Filled 2021-11-01: qty 60, 30d supply, fill #5
  Filled 2021-12-02: qty 60, 30d supply, fill #6

## 2021-06-05 MED ORDER — ATORVASTATIN CALCIUM 40 MG PO TABS
ORAL_TABLET | Freq: Every day | ORAL | 6 refills | Status: DC
  Filled 2021-06-05: qty 30, 30d supply, fill #0
  Filled 2021-07-04: qty 30, 30d supply, fill #1
  Filled 2021-08-02: qty 30, 30d supply, fill #2
  Filled 2021-09-02: qty 30, 30d supply, fill #3
  Filled 2021-10-04: qty 30, 30d supply, fill #4
  Filled 2021-11-01: qty 30, 30d supply, fill #5
  Filled 2021-12-02: qty 30, 30d supply, fill #6

## 2021-06-05 MED ORDER — LIDOCAINE 5 % EX PTCH
1.0000 | MEDICATED_PATCH | CUTANEOUS | 1 refills | Status: DC
  Filled 2021-06-05: qty 30, 30d supply, fill #0

## 2021-06-05 NOTE — Progress Notes (Signed)
Has had dizzy spells for the past 3 days ?Shoulder pain ?

## 2021-06-05 NOTE — Patient Instructions (Signed)
Cervical Sprain A cervical sprain is a stretch or tear in one or more of the ligaments in the neck. Ligaments are the tissues that connect bones. Cervical sprains can range from mild to severe. Severe cervical sprains can cause the spinal bones (vertebrae) in the neck to be unstable. This can result in spinal cord damage and in serious nervous system problems. The time that it takes for a cervical sprain to heal depends on the cause and extent of the injury. Most cervical sprains heal in 4-6 weeks. What are the causes? Cervical sprains may be caused by trauma, such as an injury from a motor vehicle accident, a fall, or a sudden forward and backward whipping movement of the head and neck (whiplash injury). Mild cervical sprains may be caused by wear and tear over time. What increases the risk? The following factors may make you more likely to develop this condition: Participating in activities that have a high risk of trauma to the neck. These include contact sports, auto racing, gymnastics, and diving. Taking risks when driving or riding in a motor vehicle. Osteoarthritis of the spine. Poor strength and flexibility of the neck. A previous neck injury. Poor posture. Spending long periods in certain positions that put stress on the neck, such as sitting at a computer for a long time. What are the signs or symptoms? Symptoms of this condition include: Pain, soreness, stiffness, tenderness, swelling, or a burning sensation in the front, back, or sides of the neck, shoulders, or upper back. Sudden tightening of neck muscles (spasms). Limited ability to move the neck. Headache. Dizziness. Nausea or vomiting. Weakness, numbness, or tingling in a hand or an arm. Symptoms may develop right away after injury, or they may develop over a few days. In some cases, symptoms may go away with treatment and return (recur) over time. How is this diagnosed? This condition may be diagnosed based on: Your  medical history. Your symptoms. Any recent injuries or known neck problems that you have, such as arthritis in the neck. A physical exam. Imaging tests, such as X-rays, MRI, and CT scan. How is this treated? This condition is treated by resting and icing the injured area and doing physical therapy exercises. Heat therapy may be used 2-3 days after the injury occurred if there is no swelling. Depending on the severity of your condition, treatment may also include: Keeping your neck in place (immobilized) for periods of time. This may be done using: A cervical collar. This supports your chin and the back of your head. A cervical traction device. This is a sling that holds up your head. The device removes weight and pressure from your neck, and it may help to relieve pain. Medicines that help to relieve pain and inflammation. Medicines that help to relax your muscles (muscle relaxants). Surgery. This is rare. Follow these instructions at home: Medicines  Take over-the-counter and prescription medicines only as told by your health care provider. Ask your health care provider if the medicine prescribed to you: Requires you to avoid driving or using heavy machinery. Can cause constipation. You may need to take these actions to prevent or treat constipation: Drink enough fluid to keep your urine pale yellow. Take over-the-counter or prescription medicines. Eat foods that are high in fiber, such as beans, whole grains, and fresh fruits and vegetables. Limit foods that are high in fat and processed sugars, such as fried or sweet foods. If you have a cervical collar: Wear the collar as told by your   health care provider. Do not remove it unless told. Ask before making any adjustments to your collar. If you have long hair, keep it outside of the collar. Ask your health care provider if you may remove the collar for cleaning and bathing. If so: Follow instructions about how to remove it  safely. Clean it by hand with mild soap and water and air-dry it completely. If your collar has removable pads, remove them every 1-2 days and wash them by hand with soap and water. Let them air-dry completely before putting them back in the collar. Tell your health care provider if your skin under the collar has irritation or sores. Managing pain, stiffness, and swelling   If directed, use a cervical traction device as told. If directed, put ice on the affected area. To do this: Put ice in a plastic bag. Place a towel between your skin and the bag. Leave the ice on for 20 minutes, 2-3 times a day. If directed, apply heat to the affected area before you do your physical therapy or as often as told by your health care provider. Use the heat source that your health care provider recommends, such as a moist heat pack or a heating pad. Place a towel between your skin and the heat source. Leave the heat on for 20-30 minutes. Remove the heat if your skin turns bright red. This is especially important if you are unable to feel pain, heat, or cold. You may have a greater risk of getting burned. Activity Do not drive while wearing a cervical collar. If you do not have a cervical collar, ask if it is safe to drive while your neck heals. Do not lift anything that is heavier than 10 lb (4.5 kg), or the limit that you are told, until your health care provider says that it is safe. Rest as told by your health care provider. If physical therapy was prescribed, do exercises as told by your health care provider or physical therapist. Return to your normal activities as told by your health care provider. Avoid positions and activities that make your symptoms worse. Ask your health care provider what activities are safe for you. General instructions Do not use any products that contain nicotine or tobacco, such as cigarettes, e-cigarettes, and chewing tobacco. These can delay healing. If you need help quitting,  ask your health care provider. Keep all follow-up visits as told by your health care provider or physical therapist. This is important. How is this prevented? To prevent a cervical sprain from happening again: Use and maintain good posture. Make any needed adjustments to your workstation to help you do this. Exercise regularly as told by your health care provider or physical therapist. Avoid risky activities that may cause a cervical sprain. Contact a health care provider if you have: Symptoms that get worse or do not get better after 2 weeks of treatment. Pain that gets worse or does not get better with medicine. New, unexplained symptoms. Sores or irritated skin on your neck from wearing your cervical collar. Get help right away if: You have severe pain. You develop numbness, tingling, or weakness in any part of your body. You cannot move a part of your body (you have paralysis). You have neck pain along with severe dizziness or headache. Summary A cervical sprain is a stretch or tear in one or more of the ligaments in the neck. Cervical sprains may be caused by trauma, such as an injury from a motor vehicle accident, a   fall, or a sudden forward and backward whipping movement of the head and neck (whiplash injury). Symptoms may develop right away after injury, or they may develop over a few days. This condition may be treated with rest, ice, heat, medicines, physical therapy, and surgery. This information is not intended to replace advice given to you by your health care provider. Make sure you discuss any questions you have with your health care provider. Document Revised: 11/10/2018 Document Reviewed: 11/10/2018 Elsevier Patient Education  2022 [employer]  

## 2021-06-05 NOTE — Progress Notes (Signed)
? ?Subjective:  ?Patient ID: Danielle Houston, female    DOB: 1940-11-06  Age: 81 y.o. MRN: [id number] ? ?CC: Hypertension ? ? ?HPI ?Danielle Houston is a 81 y.o. year old female with a history of CAD s/p CABG  (in [place]), hypothyroidism, prediabetes (A1c 5.7), essential hypertension, T9 compression fractures after a fall who presents today for a follow-up. ?She is accompanied by her son to today's visit and is seen with the aid of a Mandarin [employer] interpreter ? ?Interval History: ? ?She informed the nurse she had been dizzy but on further question both patient and the son state that she is fine today. ?She complains of a stiff L shoulder, posterior L shoulder and scapular pain and is unsure if this is positional. Symptoms have been present for 2-3 days. She has no numbness in her hands.  She did not points to her posterior neck as a source of the pain. ? ?Her BP is elevated and she endorses taking her antihypertensive. ?At her last visit Amlodipine was discontinued due to pedal edema.  In the past ACE inhibitor was discontinued due to hyperkalemia and worsening renal function.  Isosorbide was added to her regimen at her last visit. ?Past Medical History:  ?Diagnosis Date  ? Coronary artery disease   ? Hypertension   ? Hypothyroidism   ? ? ?Past Surgical History:  ?Procedure Laterality Date  ? CARDIAC CATHETERIZATION    ? CORONARY ANGIOPLASTY    ? ? ?Family History  ?Problem Relation Age of Onset  ? CAD Neg Hx   ? ? ?Social History  ? ?Socioeconomic History  ? Marital status: Single  ?  Spouse name: Not on file  ? Number of children: Not on file  ? Years of education: Not on file  ? Highest education level: Not on file  ?Occupational History  ? Not on file  ?Tobacco Use  ? Smoking status: Never  ? Smokeless tobacco: Never  ?Substance and Sexual Activity  ? Alcohol use: Never  ? Drug use: Never  ? Sexual activity: Not on file  ?Other Topics Concern  ? Not on file  ?Social History Narrative  ? Not on file  ? ?Social Determinants  of Health  ? ?Financial Resource Strain: Not on file  ?Food Insecurity: Not on file  ?Transportation Needs: Not on file  ?Physical Activity: Not on file  ?Stress: Not on file  ?Social Connections: Not on file  ? ? ?No Known Allergies ? ?Outpatient Medications Prior to Visit  ?Medication Sig Dispense Refill  ? aspirin EC 81 MG tablet Take 1 tablet (81 mg total) by mouth daily. Swallow whole. 30 tablet 11  ? hydrALAZINE (APRESOLINE) 100 MG tablet Take 1 tablet (100 mg total) by mouth 3 (three) times daily. 90 tablet 6  ? isosorbide mononitrate (IMDUR) 120 MG 24 hr tablet Take 1 tablet (120 mg total) by mouth daily. 30 tablet 3  ? lubiprostone (AMITIZA) 8 MCG capsule Take 1 capsule (8 mcg total) by mouth 2 (two) times daily with a meal. 30 capsule 3  ? omeprazole (PRILOSEC) 20 MG capsule TAKE 1 CAPSULE (20 MG TOTAL) BY MOUTH DAILY. 90 capsule 1  ? polyethylene glycol powder (GLYCOLAX/MIRALAX) 17 GM/SCOOP powder Take 17 g by mouth daily. 3350 g 1  ? allopurinol (ZYLOPRIM) 100 MG tablet Take 1 tablet (100 mg total) by mouth daily. 30 tablet 6  ? atorvastatin (LIPITOR) 40 MG tablet TAKE 1 TABLET (40 MG TOTAL) BY MOUTH DAILY. 30 tablet 6  ?  carvedilol (COREG) 12.5 MG tablet TAKE 1 TABLET (12.5 MG TOTAL) BY MOUTH 2 (TWO) TIMES DAILY WITH A MEAL. 60 tablet 6  ? diclofenac Sodium (VOLTAREN) 1 % GEL APPLY 2 Grams TOPICALLY 4 (FOUR) TIMES DAILY as needed for PAIN 100 g 2  ? ?No facility-administered medications prior to visit.  ? ? ? ?ROS ?Review of Systems  ?Constitutional:  Negative for activity change, appetite change and fatigue.  ?HENT:  Negative for congestion, sinus pressure and sore throat.   ?Eyes:  Negative for visual disturbance.  ?Respiratory:  Negative for cough, chest tightness, shortness of breath and wheezing.   ?Cardiovascular:  Negative for chest pain and palpitations.  ?Gastrointestinal:  Negative for abdominal distention, abdominal pain and constipation.  ?Endocrine: Negative for polydipsia.  ?Genitourinary:   Negative for dysuria and frequency.  ?Musculoskeletal:  Positive for neck pain. Negative for arthralgias and back pain.  ?Skin:  Negative for rash.  ?Neurological:  Negative for tremors, light-headedness and numbness.  ?Hematological:  Does not bruise/bleed easily.  ?Psychiatric/Behavioral:  Negative for agitation and behavioral problems.   ? ?Objective:  ?BP (!) 145/60   Pulse 66   Ht 5\' 2"  (1.575 m)   Wt 105 lb (47.6 kg)   LMP  (LMP Unknown)   SpO2 99%   BMI 19.20 kg/m?  ? ? ?  06/05/2021  ? 11:21 AM 06/05/2021  ? 10:58 AM 06/04/2021  ?  3:42 PM  ?BP/Weight  ?Systolic BP 481 856 314  ?Diastolic BP 60 65 77  ?Wt. (Lbs)  105   ?BMI  19.2 kg/m2   ? ? ? ? ?Physical Exam ?Constitutional:   ?   Appearance: She is well-developed.  ?Neck:  ?   Comments: Slight tenderness at C7 region ?Normal range of motion of cervical spine ?Cardiovascular:  ?   Rate and Rhythm: Normal rate.  ?   Heart sounds: Normal heart sounds. No murmur heard. ?Pulmonary:  ?   Effort: Pulmonary effort is normal.  ?   Breath sounds: Normal breath sounds. No wheezing or rales.  ?Chest:  ?   Chest wall: No tenderness.  ?Abdominal:  ?   General: Bowel sounds are normal. There is no distension.  ?   Palpations: Abdomen is soft. There is no mass.  ?   Tenderness: There is no abdominal tenderness.  ?Musculoskeletal:     ?   General: Normal range of motion.  ?   Right lower leg: No edema.  ?   Left lower leg: No edema.  ?   Comments: Normal appearance of both shoulders.  No tenderness on palpation of both shoulders, scapular region.  Full range of motion of both shoulders  ?Neurological:  ?   Mental Status: She is alert and oriented to person, place, and time.  ?Psychiatric:     ?   Mood and Affect: Mood normal.  ? ? ? ?  Latest Ref Rng & Units 02/27/2021  ? 10:24 AM 01/21/2021  ?  8:35 AM 12/13/2020  ?  8:43 AM  ?CMP  ?Glucose 70 - 99 mg/dL 122   112   108    ?BUN 8 - 27 mg/dL 71   79   33    ?Creatinine 0.57 - 1.00 mg/dL 2.12   2.61   2.04    ?Sodium 134  - 144 mmol/L 137   135   131    ?Potassium 3.5 - 5.2 mmol/L 4.7   5.5   4.3    ?Chloride  96 - 106 mmol/L 102   106   99    ?CO2 20 - 29 mmol/L 20   16   19     ?Calcium 8.7 - 10.3 mg/dL 9.8   9.5   8.8    ?Total Protein 6.0 - 8.5 g/dL 7.6   7.0     ?Total Bilirubin 0.0 - 1.2 mg/dL <0.2   0.2     ?Alkaline Phos 44 - 121 IU/L 77   68     ?AST 0 - 40 IU/L 17   13     ?ALT 0 - 32 IU/L 9   8     ? ? ?Lipid Panel  ?   ?Component Value Date/Time  ? CHOL 192 09/22/2019 0938  ? TRIG 216 (H) 09/22/2019 8676  ? HDL 54 09/22/2019 0938  ? CHOLHDL 3.6 09/22/2019 0938  ? LDLCALC 101 (H) 09/22/2019 7209  ? ? ?CBC ?   ?Component Value Date/Time  ? WBC 6.4 09/22/2019 0938  ? WBC 7.4 08/04/2019 0743  ? RBC 3.61 (L) 09/22/2019 4709  ? RBC 3.45 (L) 08/04/2019 0743  ? RBC 3.41 (L) 08/04/2019 0743  ? HGB 11.0 (L) 09/22/2019 6283  ? HCT 34.1 09/22/2019 0938  ? PLT 229 09/22/2019 0938  ? MCV 95 09/22/2019 0938  ? MCH 30.5 09/22/2019 0938  ? MCH 31.3 08/04/2019 0743  ? MCHC 32.3 09/22/2019 0938  ? MCHC 33.3 08/04/2019 0743  ? RDW 12.6 09/22/2019 0938  ? LYMPHSABS 2.5 09/22/2019 0938  ? EOSABS 0.1 09/22/2019 0938  ? BASOSABS 0.1 09/22/2019 6629  ? ? ?Lab Results  ?Component Value Date  ? HGBA1C 5.6 11/29/2020  ? ? ?Assessment & Plan:  ?1. Neck pain ?She actually has posterior neck pain at the site of C7 but had previously informed the nurse of shoulder pain ?From history and physical exam she has no shoulder pain ?We will use topical agents-Lidoderm patches and Voltaren gel ?Holding off on NSAIDs due to abnormal renal function ?Advised to apply heat or ice whichever is tolerated to painful areas. ?Counseled on evidence of improvement in pain control with regards to yoga, water aerobics, massage, home physical therapy, exercise as tolerated. ? ?- diclofenac Sodium (VOLTAREN) 1 % GEL; APPLY 2 Grams TOPICALLY 4 (FOUR) TIMES DAILY as needed for PAIN  Dispense: 100 g; Refill: 2 ?- lidocaine (LIDODERM) 5 %; Place 1 patch onto the skin daily. Remove  & Discard patch within 12 hours or as directed by MD  Dispense: 30 patch; Refill: 1 ? ?2. Benign hypertension with CKD (chronic kidney disease) stage III (HCC) ?Uncontrolled ?Initial blood pressure was sig

## 2021-06-06 ENCOUNTER — Other Ambulatory Visit: Payer: Self-pay | Admitting: Family Medicine

## 2021-06-06 DIAGNOSIS — I129 Hypertensive chronic kidney disease with stage 1 through stage 4 chronic kidney disease, or unspecified chronic kidney disease: Secondary | ICD-10-CM | POA: Insufficient documentation

## 2021-06-06 LAB — CMP14+EGFR
ALT: 31 IU/L (ref 0–32)
AST: 31 IU/L (ref 0–40)
Albumin/Globulin Ratio: 1.5 (ref 1.2–2.2)
Albumin: 4.5 g/dL (ref 3.6–4.6)
Alkaline Phosphatase: 144 IU/L — ABNORMAL HIGH (ref 44–121)
BUN/Creatinine Ratio: 31 — ABNORMAL HIGH (ref 12–28)
BUN: 72 mg/dL — ABNORMAL HIGH (ref 8–27)
Bilirubin Total: 0.3 mg/dL (ref 0.0–1.2)
CO2: 24 mmol/L (ref 20–29)
Calcium: 9.4 mg/dL (ref 8.7–10.3)
Chloride: 95 mmol/L — ABNORMAL LOW (ref 96–106)
Creatinine, Ser: 2.31 mg/dL — ABNORMAL HIGH (ref 0.57–1.00)
Globulin, Total: 3 g/dL (ref 1.5–4.5)
Glucose: 162 mg/dL — ABNORMAL HIGH (ref 70–99)
Potassium: 4.2 mmol/L (ref 3.5–5.2)
Sodium: 133 mmol/L — ABNORMAL LOW (ref 134–144)
Total Protein: 7.5 g/dL (ref 6.0–8.5)
eGFR: 21 mL/min/{1.73_m2} — ABNORMAL LOW (ref >59)

## 2021-07-01 ENCOUNTER — Ambulatory Visit: Payer: Self-pay | Admitting: *Deleted

## 2021-07-01 NOTE — Telephone Encounter (Signed)
Call received from patient's son Trisha Mangle that is with patient now and interpreter Rachal ID [phone] regarding labs results . ?Pt given lab results per notes of Dr. Margarita Rana  from 06/06/21 on 07/01/21. Pt  son verbalized understanding per interpreter and reviewed to avoid medications like ibuprofen and Aleve. Next OV 09/10/21.   ?

## 2021-07-04 ENCOUNTER — Ambulatory Visit (INDEPENDENT_AMBULATORY_CARE_PROVIDER_SITE_OTHER): Payer: Medicaid Other | Admitting: Obstetrics and Gynecology

## 2021-07-04 ENCOUNTER — Encounter: Payer: Self-pay | Admitting: Obstetrics and Gynecology

## 2021-07-04 ENCOUNTER — Other Ambulatory Visit: Payer: Self-pay

## 2021-07-04 ENCOUNTER — Other Ambulatory Visit: Payer: Self-pay | Admitting: Family Medicine

## 2021-07-04 VITALS — BP 182/69 | HR 69

## 2021-07-04 DIAGNOSIS — N812 Incomplete uterovaginal prolapse: Secondary | ICD-10-CM

## 2021-07-04 DIAGNOSIS — K219 Gastro-esophageal reflux disease without esophagitis: Secondary | ICD-10-CM

## 2021-07-04 DIAGNOSIS — K5909 Other constipation: Secondary | ICD-10-CM

## 2021-07-04 MED ORDER — OMEPRAZOLE 20 MG PO CPDR
DELAYED_RELEASE_CAPSULE | Freq: Every day | ORAL | 2 refills | Status: DC
  Filled 2021-07-04 – 2021-08-02 (×2): qty 30, 30d supply, fill #0
  Filled 2021-09-02: qty 30, 30d supply, fill #1
  Filled 2021-10-04: qty 30, 30d supply, fill #2

## 2021-07-04 MED ORDER — LUBIPROSTONE 8 MCG PO CAPS
8.0000 ug | ORAL_CAPSULE | Freq: Two times a day (BID) | ORAL | 2 refills | Status: DC
  Filled 2021-07-04 – 2021-08-02 (×2): qty 30, 15d supply, fill #0
  Filled 2021-09-02: qty 30, 15d supply, fill #1
  Filled 2021-10-04: qty 30, 15d supply, fill #2

## 2021-07-04 NOTE — Progress Notes (Signed)
[facility] ? ? ?Subjective:  ?  ? ?Chief Complaint:  ?Chief Complaint  ?Patient presents with  ? Pessary Check  ? ?History of Present Illness: ?Danielle Houston is a 81 y.o. female with stage III pelvic organ prolapse who presents for a pessary check. She is using a #4 (2-1/2in) short stem gellhorn pessary. The pessary has been working well and she has no complaints. She is not using vaginal estrogen. She denies vaginal bleeding. ? ?Past Medical History: ?Patient  has a past medical history of Coronary artery disease, Hypertension, and Hypothyroidism.  ? ?Past Surgical History: ?She  has a past surgical history that includes Cardiac catheterization and Coronary angioplasty.  ? ?Medications: ?She has a current medication list which includes the following prescription(s): allopurinol, aspirin ec, atorvastatin, carvedilol, diclofenac sodium, hydralazine, isosorbide mononitrate, lidocaine, polyethylene glycol powder, lubiprostone, omeprazole, and [DISCONTINUED] gabapentin.  ? ?Allergies: ?Patient has No Known Allergies.  ? ?Social History: ?Patient  reports that she has never smoked. She has never used smokeless tobacco. She reports that she does not drink alcohol and does not use drugs.  ? ?  ? ?Objective:  ?  ?Physical Exam: ?BP (!) 182/69   Pulse 69   LMP  (LMP Unknown)  ?Gen: No apparent distress, A&O x 3. ?Detailed Urogynecologic Evaluation:  ?Pelvic Exam: Normal external female genitalia; Bartholin's and Skene's glands normal in appearance; urethral meatus normal in appearance, no urethral masses or discharge. The pessary was noted to be in place but with stem facing slightly downward toward rectum.  It was removed and cleaned. Speculum exam revealed no lesions in the vagina. The pessary was replaced with a long stem 2-1/2 in gellhorn. It was comfortable to the patient and fit well.  ? ?POP-Q:  ?  ?POP-Q ?  ?0  ?                                          Aa   ?0 ?                                           Ba   ?1.5  ?                                            C  ?  ?4.5  ?                                          Gh   ?3  ?                                          Pb   ?8  ?                                          tvl  ?  ?-2  ?  Ap   ?-2  ?                                          Bp   ?-6  ?                                            D  ? ? ? ? ?Assessment/Plan:  ?  ?Assessment: ?Danielle Houston is a 81 y.o. with stage III pelvic organ prolapse here for a pessary check. She is doing well. ? ?Plan: ?- Pessary replaced today with a LS 2-1/2 in gellhorn pessary. - She will keep the pessary in place until next visit.  ?- Follow up 3 months ? ?Jaquita Folds, MD ? ? Time spent: I spent 20 minutes dedicated to the care of this patient on the date of this encounter to include pre-visit review of records, face-to-face time with the patient and post visit documentation. ? ? ?

## 2021-07-05 ENCOUNTER — Other Ambulatory Visit: Payer: Self-pay

## 2021-07-06 ENCOUNTER — Encounter: Payer: Self-pay | Admitting: Obstetrics and Gynecology

## 2021-07-15 ENCOUNTER — Other Ambulatory Visit: Payer: Self-pay

## 2021-08-02 ENCOUNTER — Other Ambulatory Visit: Payer: Self-pay | Admitting: Family Medicine

## 2021-08-02 ENCOUNTER — Other Ambulatory Visit: Payer: Self-pay

## 2021-08-02 DIAGNOSIS — I10 Essential (primary) hypertension: Secondary | ICD-10-CM

## 2021-08-02 MED ORDER — ISOSORBIDE MONONITRATE ER 120 MG PO TB24
120.0000 mg | ORAL_TABLET | Freq: Every day | ORAL | 3 refills | Status: DC
  Filled 2021-08-02 – 2021-12-25 (×2): qty 30, 30d supply, fill #0
  Filled 2022-01-23: qty 30, 30d supply, fill #1
  Filled 2022-02-17: qty 30, 30d supply, fill #2
  Filled 2022-03-19: qty 30, 30d supply, fill #3

## 2021-08-02 NOTE — Telephone Encounter (Signed)
Requested Prescriptions  Pending Prescriptions Disp Refills  . isosorbide mononitrate (IMDUR) 120 MG 24 hr tablet 30 tablet 3    Sig: Take 1 tablet (120 mg total) by mouth daily.     Cardiovascular:  Nitrates Failed - 08/02/2021  2:05 PM      Failed - Last BP in normal range    BP Readings from Last 1 Encounters:  07/04/21 (!) 182/69         Passed - Last Heart Rate in normal range    Pulse Readings from Last 1 Encounters:  07/04/21 69         Passed - Valid encounter within last 12 months    Recent Outpatient Visits          1 month ago Neck pain   [facility], Charlane Ferretti, MD   2 months ago Essential hypertension   [facility], Cari S, PA-C   4 months ago Essential hypertension   [place], Charlane Ferretti, MD   5 months ago Essential hypertension   [facility], Charlane Ferretti, MD   8 months ago Coleman, Enobong, MD      Future Appointments            In 1 month Charlott Rakes, MD [place]   In 2 months Wannetta Sender, Governor Rooks, MD [facility]cology at [facility]

## 2021-08-08 ENCOUNTER — Other Ambulatory Visit: Payer: Self-pay

## 2021-09-02 ENCOUNTER — Other Ambulatory Visit: Payer: Self-pay

## 2021-09-02 ENCOUNTER — Other Ambulatory Visit: Payer: Self-pay | Admitting: Family Medicine

## 2021-09-02 DIAGNOSIS — M542 Cervicalgia: Secondary | ICD-10-CM

## 2021-09-02 MED ORDER — DICLOFENAC SODIUM 1 % EX GEL
CUTANEOUS | 2 refills | Status: DC
  Filled 2021-09-02: qty 100, 12d supply, fill #0
  Filled 2021-10-04: qty 100, 12d supply, fill #1
  Filled 2021-11-01: qty 100, 12d supply, fill #2

## 2021-09-10 ENCOUNTER — Ambulatory Visit: Payer: Medicaid Other | Attending: Family Medicine | Admitting: Family Medicine

## 2021-09-10 ENCOUNTER — Encounter: Payer: Self-pay | Admitting: Family Medicine

## 2021-09-10 VITALS — BP 145/67 | HR 67 | Temp 99.0°F | Ht 62.0 in | Wt 106.4 lb

## 2021-09-10 DIAGNOSIS — M10071 Idiopathic gout, right ankle and foot: Secondary | ICD-10-CM

## 2021-09-10 DIAGNOSIS — N184 Chronic kidney disease, stage 4 (severe): Secondary | ICD-10-CM | POA: Diagnosis not present

## 2021-09-10 DIAGNOSIS — M1990 Unspecified osteoarthritis, unspecified site: Secondary | ICD-10-CM | POA: Diagnosis not present

## 2021-09-10 DIAGNOSIS — I129 Hypertensive chronic kidney disease with stage 1 through stage 4 chronic kidney disease, or unspecified chronic kidney disease: Secondary | ICD-10-CM

## 2021-09-11 LAB — BASIC METABOLIC PANEL
BUN/Creatinine Ratio: 22 (ref 12–28)
BUN: 47 mg/dL — ABNORMAL HIGH (ref 8–27)
CO2: 21 mmol/L (ref 20–29)
Calcium: 9 mg/dL (ref 8.7–10.3)
Chloride: 104 mmol/L (ref 96–106)
Creatinine, Ser: 2.09 mg/dL — ABNORMAL HIGH (ref 0.57–1.00)
Glucose: 179 mg/dL — ABNORMAL HIGH (ref 70–99)
Potassium: 5.1 mmol/L (ref 3.5–5.2)
Sodium: 138 mmol/L (ref 134–144)
eGFR: 23 mL/min/{1.73_m2} — ABNORMAL LOW (ref >59)

## 2021-10-04 ENCOUNTER — Encounter: Payer: Self-pay | Admitting: Obstetrics and Gynecology

## 2021-10-04 ENCOUNTER — Other Ambulatory Visit: Payer: Self-pay

## 2021-10-04 ENCOUNTER — Ambulatory Visit (INDEPENDENT_AMBULATORY_CARE_PROVIDER_SITE_OTHER): Payer: Medicaid Other | Admitting: Obstetrics and Gynecology

## 2021-10-04 VITALS — BP 137/58 | HR 66

## 2021-10-04 DIAGNOSIS — N812 Incomplete uterovaginal prolapse: Secondary | ICD-10-CM | POA: Diagnosis not present

## 2021-10-04 NOTE — Progress Notes (Signed)
[facility]   Subjective:     Chief Complaint:  Chief Complaint  Patient presents with   Pessary Check   History of Present Illness: Danielle Houston is a 81 y.o. female with stage III pelvic organ prolapse who presents for a pessary check. She is using a #4 (2-1/2in) short stem gellhorn pessary. The pessary has been working well. She denies vaginal bleeding.  Past Medical History: Patient  has a past medical history of Coronary artery disease, Hypertension, and Hypothyroidism.   Past Surgical History: She  has a past surgical history that includes Cardiac catheterization and Coronary angioplasty.   Medications: She has a current medication list which includes the following prescription(s): allopurinol, aspirin ec, atorvastatin, carvedilol, diclofenac sodium, hydralazine, isosorbide mononitrate, lidocaine, lubiprostone, omeprazole, polyethylene glycol powder, and [DISCONTINUED] gabapentin.   Allergies: Patient has No Known Allergies.   Social History: Patient  reports that she has never smoked. She has never used smokeless tobacco. She reports that she does not drink alcohol and does not use drugs.      Objective:    Physical Exam: BP (!) 137/58   Pulse 66   LMP  (LMP Unknown)  Gen: No apparent distress, A&O x 3.  Detailed Urogynecologic Evaluation:  Pelvic Exam: Normal external female genitalia; Bartholin's and Skene's glands normal in appearance; urethral meatus normal in appearance, no urethral masses or discharge. The pessary was noted to be in place. It was removed and cleaned. Speculum exam revealed no lesions in the vagina. The pessary was replaced. It was comfortable to the patient and fit well.   POP-Q:    POP-Q   0                                            Aa   0                                           Ba   1.5                                              C    4.5                                            Gh   3                                             Pb   8                                            tvl    -2  Ap   -2                                            Bp   -6                                              D      Assessment/Plan:    Assessment: Danielle Houston is a 81 y.o. with stage III pelvic organ prolapse here for a pessary check. She is doing well.  Plan: - 2-1/2 in gellhorn pessary.  - She will keep the pessary in place until next visit.  - Follow up 3 months  Jaquita Folds, MD   Time spent: I spent 15 minutes dedicated to the care of this patient on the date of this encounter to include pre-visit review of records, face-to-face time with the patient and post visit documentation.

## 2021-11-01 ENCOUNTER — Other Ambulatory Visit: Payer: Self-pay

## 2021-11-01 ENCOUNTER — Other Ambulatory Visit: Payer: Self-pay | Admitting: Family Medicine

## 2021-11-01 DIAGNOSIS — K219 Gastro-esophageal reflux disease without esophagitis: Secondary | ICD-10-CM

## 2021-11-01 DIAGNOSIS — K5909 Other constipation: Secondary | ICD-10-CM

## 2021-11-01 MED ORDER — LUBIPROSTONE 8 MCG PO CAPS
8.0000 ug | ORAL_CAPSULE | Freq: Two times a day (BID) | ORAL | 2 refills | Status: DC
  Filled 2021-11-01: qty 60, 30d supply, fill #0
  Filled 2021-12-02: qty 30, 15d supply, fill #1

## 2021-11-01 MED ORDER — OMEPRAZOLE 20 MG PO CPDR
DELAYED_RELEASE_CAPSULE | Freq: Every day | ORAL | 2 refills | Status: DC
  Filled 2021-11-01: qty 30, 30d supply, fill #0
  Filled 2021-12-02: qty 30, 30d supply, fill #1
  Filled 2021-12-25: qty 30, 30d supply, fill #2

## 2021-11-19 ENCOUNTER — Other Ambulatory Visit: Payer: Self-pay

## 2021-12-02 ENCOUNTER — Other Ambulatory Visit: Payer: Self-pay

## 2021-12-02 ENCOUNTER — Other Ambulatory Visit: Payer: Self-pay | Admitting: Family Medicine

## 2021-12-02 DIAGNOSIS — M542 Cervicalgia: Secondary | ICD-10-CM

## 2021-12-03 ENCOUNTER — Other Ambulatory Visit: Payer: Self-pay

## 2021-12-03 MED ORDER — DICLOFENAC SODIUM 1 % EX GEL
CUTANEOUS | 0 refills | Status: DC
  Filled 2021-12-03 – 2021-12-25 (×2): qty 100, 13d supply, fill #0

## 2021-12-03 NOTE — Telephone Encounter (Signed)
Requested medication (s) are due for refill today:   Yes  Requested medication (s) are on the active medication list:   Yes  Future visit scheduled:   Yes   Last ordered: 09/02/2021 100g, 2 refills  Returned because labs are due per protocol.      Requested Prescriptions  Pending Prescriptions Disp Refills   diclofenac Sodium (VOLTAREN) 1 % GEL 100 g 2    Sig: APPLY 2 Grams TOPICALLY 4 (FOUR) TIMES DAILY as needed for PAIN     Analgesics:  Topicals Failed - 12/02/2021 12:14 PM      Failed - Manual Review: Labs are only required if the patient has taken medication for more than 8 weeks.      Failed - PLT in normal range and within 360 days    Platelets  Date Value Ref Range Status  09/22/2019 229 150 - 450 x10E3/uL Final         Failed - HGB in normal range and within 360 days    Hemoglobin  Date Value Ref Range Status  09/22/2019 11.0 (L) 11.1 - 15.9 g/dL Final         Failed - HCT in normal range and within 360 days    Hematocrit  Date Value Ref Range Status  09/22/2019 34.1 34.0 - 46.6 % Final         Failed - Cr in normal range and within 360 days    Creatinine, Ser  Date Value Ref Range Status  09/10/2021 2.09 (H) 0.57 - 1.00 mg/dL Final         Failed - eGFR is 30 or above and within 360 days    GFR calc Af Amer  Date Value Ref Range Status  04/24/2020 42 (L) >59 mL/min/1.73 Final    Comment:    **In accordance with recommendations from the NKF-ASN Task force,**   [facility] is in the process of updating its eGFR calculation to the   2021 CKD-EPI creatinine equation that estimates kidney function   without a race variable.    GFR calc non Af Amer  Date Value Ref Range Status  04/24/2020 36 (L) >59 mL/min/1.73 Final   eGFR  Date Value Ref Range Status  09/10/2021 23 (L) >59 mL/min/1.73 Final         Passed - Patient is not pregnant      Passed - Valid encounter within last 12 months    Recent Outpatient Visits           2 months ago Benign  hypertension with CKD (chronic kidney disease) stage IV (Calcium)   [place], Charlane Ferretti, MD   6 months ago Neck pain   [place], Charlane Ferretti, MD   6 months ago Essential hypertension   [facility], Cari S, PA-C   8 months ago Essential hypertension   [place], Charlane Ferretti, MD   9 months ago Essential hypertension   [place], Charlane Ferretti, MD       Future Appointments             In 2 weeks Charlott Rakes, MD [place]   In 1 month Wannetta Sender, Governor Rooks, MD Urogynecology at [facility], [facility]

## 2021-12-05 IMAGING — CR DG THORACIC SPINE 2V
2 series · 2 of 2 positions shown · non-contrast
Comparison: Plain films lumbar spine this same day.

CLINICAL DATA: Mid and low back pain since a fall 08/01/2019.
Initial encounter.

EXAM:
THORACIC SPINE 2 VIEWS

[t-spine ap]
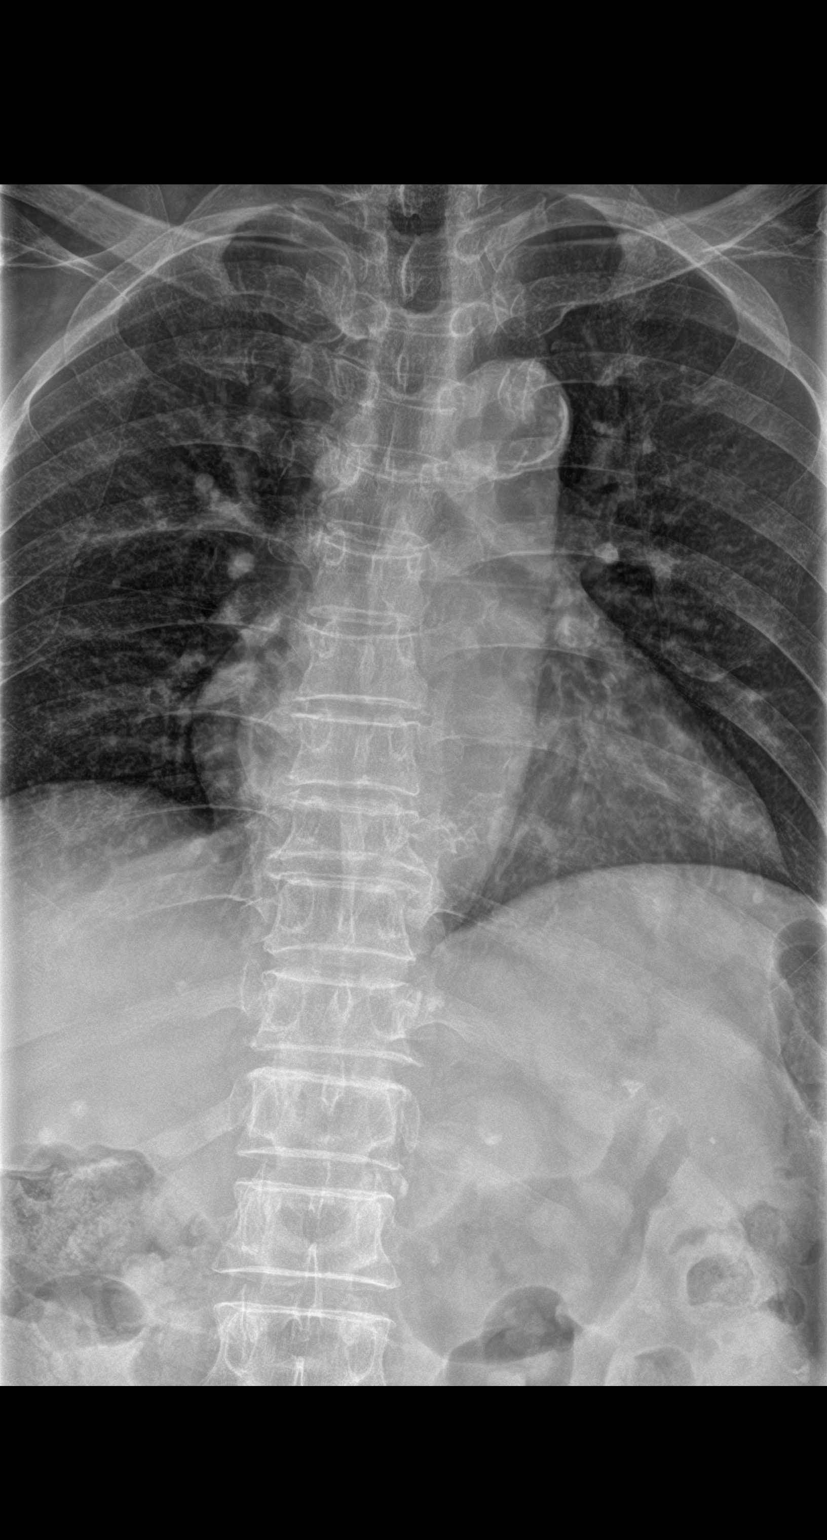

[t-spine lat]
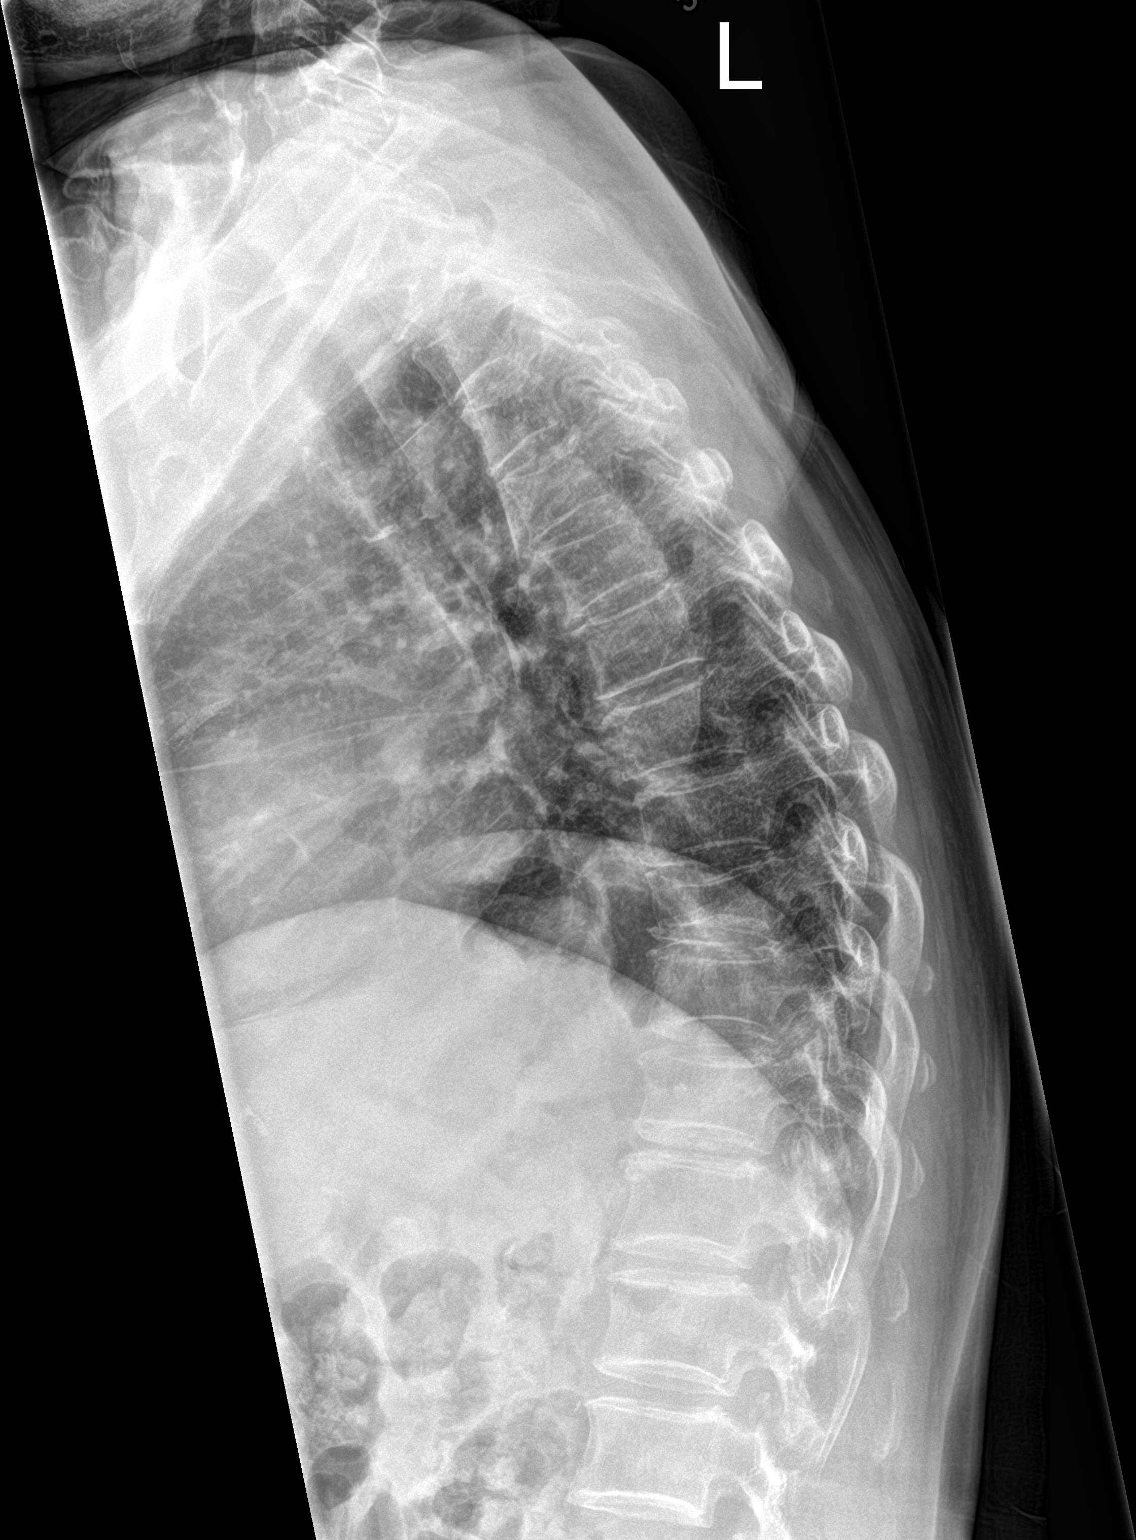

[2 of 2 positions shown; findings below may reference images not displayed]

FINDINGS: T9 compression fracture is seen as on comparison plain films of the
lumbar spine today. No other fracture is identified. No worrisome
bony lesion. Atherosclerosis noted.
IMPRESSION: Age-indeterminate T9 compression fracture as seen on plain films of
the lumbar spine this same day. No other fracture is identified.

Aortic Atherosclerosis ([id number]).

## 2021-12-10 ENCOUNTER — Other Ambulatory Visit: Payer: Self-pay

## 2021-12-18 ENCOUNTER — Ambulatory Visit: Payer: Medicaid Other | Attending: Family Medicine | Admitting: Family Medicine

## 2021-12-18 ENCOUNTER — Telehealth: Payer: Self-pay

## 2021-12-18 ENCOUNTER — Encounter: Payer: Self-pay | Admitting: Family Medicine

## 2021-12-18 ENCOUNTER — Other Ambulatory Visit: Payer: Self-pay

## 2021-12-18 VITALS — BP 212/86 | HR 63 | Ht 62.0 in | Wt 102.4 lb

## 2021-12-18 DIAGNOSIS — N3281 Overactive bladder: Secondary | ICD-10-CM

## 2021-12-18 DIAGNOSIS — I10 Essential (primary) hypertension: Secondary | ICD-10-CM

## 2021-12-18 DIAGNOSIS — I25119 Atherosclerotic heart disease of native coronary artery with unspecified angina pectoris: Secondary | ICD-10-CM

## 2021-12-18 DIAGNOSIS — N814 Uterovaginal prolapse, unspecified: Secondary | ICD-10-CM

## 2021-12-18 DIAGNOSIS — N183 Chronic kidney disease, stage 3 unspecified: Secondary | ICD-10-CM

## 2021-12-18 DIAGNOSIS — N3000 Acute cystitis without hematuria: Secondary | ICD-10-CM

## 2021-12-18 DIAGNOSIS — M1A0710 Idiopathic chronic gout, right ankle and foot, without tophus (tophi): Secondary | ICD-10-CM | POA: Diagnosis not present

## 2021-12-18 DIAGNOSIS — I129 Hypertensive chronic kidney disease with stage 1 through stage 4 chronic kidney disease, or unspecified chronic kidney disease: Secondary | ICD-10-CM | POA: Diagnosis not present

## 2021-12-18 DIAGNOSIS — E039 Hypothyroidism, unspecified: Secondary | ICD-10-CM

## 2021-12-18 DIAGNOSIS — R159 Full incontinence of feces: Secondary | ICD-10-CM

## 2021-12-18 LAB — POCT URINALYSIS DIP (CLINITEK)
Bilirubin, UA: NEGATIVE
Blood, UA: NEGATIVE
Glucose, UA: NEGATIVE mg/dL
Ketones, POC UA: NEGATIVE mg/dL
Nitrite, UA: NEGATIVE
POC PROTEIN,UA: 100 — AB
Spec Grav, UA: 1.015 (ref 1.010–1.025)
Urobilinogen, UA: 0.2 E.U./dL
pH, UA: 5.5 (ref 5.0–8.0)

## 2021-12-18 MED ORDER — HYDRALAZINE HCL 100 MG PO TABS
100.0000 mg | ORAL_TABLET | Freq: Three times a day (TID) | ORAL | 6 refills | Status: DC
  Filled 2021-12-18: qty 90, 30d supply, fill #0

## 2021-12-18 MED ORDER — TOLTERODINE TARTRATE ER 2 MG PO CP24
2.0000 mg | ORAL_CAPSULE | Freq: Every day | ORAL | 0 refills | Status: DC
  Filled 2021-12-18 – 2021-12-25 (×2): qty 30, 30d supply, fill #0

## 2021-12-18 MED ORDER — CEPHALEXIN 500 MG PO CAPS
500.0000 mg | ORAL_CAPSULE | Freq: Two times a day (BID) | ORAL | 0 refills | Status: DC
  Filled 2021-12-18: qty 6, 3d supply, fill #0

## 2021-12-18 MED ORDER — ALLOPURINOL 100 MG PO TABS
100.0000 mg | ORAL_TABLET | Freq: Every day | ORAL | 6 refills | Status: DC
  Filled 2021-12-18 – 2021-12-25 (×2): qty 30, 30d supply, fill #0
  Filled 2022-01-23: qty 30, 30d supply, fill #1
  Filled 2022-02-26: qty 30, 30d supply, fill #2
  Filled 2022-03-20 (×2): qty 30, 30d supply, fill #3
  Filled 2022-04-22: qty 30, 30d supply, fill #4
  Filled 2022-05-21 (×2): qty 30, 30d supply, fill #5

## 2021-12-18 MED ORDER — CARVEDILOL 12.5 MG PO TABS
12.5000 mg | ORAL_TABLET | Freq: Two times a day (BID) | ORAL | 6 refills | Status: DC
  Filled 2021-12-18: qty 60, fill #0
  Filled 2021-12-25: qty 60, 30d supply, fill #0
  Filled 2022-01-23: qty 60, 30d supply, fill #1
  Filled 2022-02-17: qty 60, 30d supply, fill #2
  Filled 2022-03-19: qty 60, 30d supply, fill #3
  Filled 2022-04-22 (×2): qty 60, 30d supply, fill #4
  Filled 2022-05-21 (×2): qty 60, 30d supply, fill #5

## 2021-12-18 MED ORDER — ATORVASTATIN CALCIUM 40 MG PO TABS
40.0000 mg | ORAL_TABLET | Freq: Every day | ORAL | 6 refills | Status: DC
  Filled 2021-12-18: qty 30, fill #0
  Filled 2021-12-25: qty 30, 30d supply, fill #0
  Filled 2022-01-23: qty 30, 30d supply, fill #1
  Filled 2022-02-17: qty 30, 30d supply, fill #2
  Filled 2022-03-19: qty 30, 30d supply, fill #3
  Filled 2022-04-22: qty 30, 30d supply, fill #4
  Filled 2022-05-21 (×2): qty 30, 30d supply, fill #5

## 2021-12-18 NOTE — Progress Notes (Signed)
Subjective:  Patient ID: Danielle Houston, female    DOB: 1940-03-23  Age: 81 y.o. MRN: [id number]  CC: Hypertension   HPI Danielle Houston is a 81 y.o. year old female with a history of CAD s/p CABG  (in [place]), hypothyroidism, prediabetes (A1c 5.7), essential hypertension, T9 compression fracture after a fall, uterovaginal prolapse who presents today for a follow-up. She is accompanied by her son to today's visit and is seen with the aid of a Mandarin [employer] interpreter History is obtained from son as patient does not communicate much  Interval History:  Son Complains of she has more frequent urinary and fecal incontinence which has become more frequent. Last year she did have this once or twice. She is under the care of Urogynecology for pelvic prolapse and has a pessary in place. Son states last night while she was standing she had an accident and moved her bowels and urinated on herself as well. Fecal incontinence is more frequent and occurs as diarrhea.  Med list reveals she is on Amitiza for constipation but son states Mom does not take Amitiza and I have showed him pictures of Amitiza and he states they do not have that.  Her BP is significantly elevated and patient states she took her medications today. Past Medical History:  Diagnosis Date   Coronary artery disease    Hypertension    Hypothyroidism     Past Surgical History:  Procedure Laterality Date   CARDIAC CATHETERIZATION     CORONARY ANGIOPLASTY      Family History  Problem Relation Age of Onset   CAD Neg Hx     Social History   Socioeconomic History   Marital status: Single    Spouse name: Not on file   Number of children: Not on file   Years of education: Not on file   Highest education level: Not on file  Occupational History   Not on file  Tobacco Use   Smoking status: Never   Smokeless tobacco: Never  Substance and Sexual Activity   Alcohol use: Never   Drug use: Never   Sexual activity: Not on file   Other Topics Concern   Not on file  Social History Narrative   Not on file   Social Determinants of Health   Financial Resource Strain: Not on file  Food Insecurity: Not on file  Transportation Needs: Not on file  Physical Activity: Not on file  Stress: Not on file  Social Connections: Not on file    No Known Allergies  Outpatient Medications Prior to Visit  Medication Sig Dispense Refill   aspirin EC 81 MG tablet Take 1 tablet (81 mg total) by mouth daily. Swallow whole. 30 tablet 11   diclofenac Sodium (VOLTAREN) 1 % GEL APPLY 2 Grams TOPICALLY 4 (FOUR) TIMES DAILY as needed for PAIN 100 g 0   isosorbide mononitrate (IMDUR) 120 MG 24 hr tablet Take 1 tablet (120 mg total) by mouth daily. 30 tablet 3   lidocaine (LIDODERM) 5 % Place 1 patch onto the skin daily. Remove & Discard patch within 12 hours or as directed by MD 30 patch 1   omeprazole (PRILOSEC) 20 MG capsule TAKE 1 CAPSULE (20 MG TOTAL) BY MOUTH DAILY. 30 capsule 2   polyethylene glycol powder (GLYCOLAX/MIRALAX) 17 GM/SCOOP powder Take 17 g by mouth daily. 3350 g 1   allopurinol (ZYLOPRIM) 100 MG tablet Take 1 tablet (100 mg total) by mouth daily. 30 tablet 6   atorvastatin (LIPITOR)  40 MG tablet TAKE 1 TABLET (40 MG TOTAL) BY MOUTH DAILY. 30 tablet 6   carvedilol (COREG) 12.5 MG tablet TAKE 1 TABLET (12.5 MG TOTAL) BY MOUTH 2 (TWO) TIMES DAILY WITH A MEAL. 60 tablet 6   hydrALAZINE (APRESOLINE) 100 MG tablet Take 1 tablet (100 mg total) by mouth 3 (three) times daily. 90 tablet 6   lubiprostone (AMITIZA) 8 MCG capsule Take 1 capsule (8 mcg total) by mouth 2 (two) times daily with a meal. 30 capsule 2   No facility-administered medications prior to visit.     ROS Review of Systems  Constitutional:  Negative for activity change, appetite change and fatigue.  HENT:  Negative for congestion, sinus pressure and sore throat.   Eyes:  Negative for visual disturbance.  Respiratory:  Negative for cough, chest tightness,  shortness of breath and wheezing.   Cardiovascular:  Negative for chest pain and palpitations.  Gastrointestinal:  Negative for abdominal distention, abdominal pain and constipation.  Endocrine: Negative for polydipsia.  Genitourinary:  Negative for dysuria and frequency.  Musculoskeletal:  Negative for arthralgias and back pain.  Skin:  Negative for rash.  Neurological:  Negative for tremors, light-headedness and numbness.  Hematological:  Does not bruise/bleed easily.  Psychiatric/Behavioral:  Negative for agitation and behavioral problems.     Objective:  BP (!) 212/86   Pulse 63   Ht 5\' 2"  (1.575 m)   Wt 102 lb 6.4 oz (46.4 kg)   LMP  (LMP Unknown)   SpO2 99%   BMI 18.73 kg/m      12/18/2021   12:01 PM 12/18/2021   10:57 AM 10/04/2021    1:30 PM  BP/Weight  Systolic BP 607 371 062  Diastolic BP 86 76 58  Wt. (Lbs)  102.4   BMI  18.73 kg/m2       Physical Exam Constitutional:      Appearance: She is well-developed.  Cardiovascular:     Rate and Rhythm: Normal rate.     Heart sounds: Normal heart sounds. No murmur heard. Pulmonary:     Effort: Pulmonary effort is normal.     Breath sounds: Normal breath sounds. No wheezing or rales.  Chest:     Chest wall: No tenderness.  Abdominal:     General: Bowel sounds are normal. There is no distension.     Palpations: Abdomen is soft. There is no mass.     Tenderness: There is no abdominal tenderness.  Musculoskeletal:        General: Normal range of motion.     Right lower leg: No edema.     Left lower leg: No edema.  Neurological:     Mental Status: She is alert.     Comments: She does seem a bit more restless than at her previous appointment   Psychiatric:        Mood and Affect: Mood normal.        Latest Ref Rng & Units 09/10/2021    3:40 PM 06/05/2021   11:37 AM 02/27/2021   10:24 AM  CMP  Glucose 70 - 99 mg/dL 179  162  122   BUN 8 - 27 mg/dL 47  72  71   Creatinine 0.57 - 1.00 mg/dL 2.09  2.31  2.12    Sodium 134 - 144 mmol/L 138  133  137   Potassium 3.5 - 5.2 mmol/L 5.1  4.2  4.7   Chloride 96 - 106 mmol/L 104  95  102  CO2 20 - 29 mmol/L 21  24  20    Calcium 8.7 - 10.3 mg/dL 9.0  9.4  9.8   Total Protein 6.0 - 8.5 g/dL  7.5  7.6   Total Bilirubin 0.0 - 1.2 mg/dL  0.3  <0.2   Alkaline Phos 44 - 121 IU/L  144  77   AST 0 - 40 IU/L  31  17   ALT 0 - 32 IU/L  31  9     Lipid Panel     Component Value Date/Time   CHOL 192 09/22/2019 0938   TRIG 216 (H) 09/22/2019 0938   HDL 54 09/22/2019 0938   CHOLHDL 3.6 09/22/2019 0938   LDLCALC 101 (H) 09/22/2019 0938    CBC    Component Value Date/Time   WBC 6.4 09/22/2019 0938   WBC 7.4 08/04/2019 0743   RBC 3.61 (L) 09/22/2019 0938   RBC 3.45 (L) 08/04/2019 0743   RBC 3.41 (L) 08/04/2019 0743   HGB 11.0 (L) 09/22/2019 0938   HCT 34.1 09/22/2019 0938   PLT 229 09/22/2019 0938   MCV 95 09/22/2019 0938   MCH 30.5 09/22/2019 0938   MCH 31.3 08/04/2019 0743   MCHC 32.3 09/22/2019 0938   MCHC 33.3 08/04/2019 0743   RDW 12.6 09/22/2019 0938   LYMPHSABS 2.5 09/22/2019 0938   EOSABS 0.1 09/22/2019 0938   BASOSABS 0.1 09/22/2019 0938    Lab Results  Component Value Date   HGBA1C 5.6 11/29/2020    Lab Results  Component Value Date   TSH 2.180 09/22/2019      Assessment & Plan:   1. Chronic idiopathic gout involving toe of right foot without tophus Stable - allopurinol (ZYLOPRIM) 100 MG tablet; Take 1 tablet (100 mg total) by mouth daily.  Dispense: 30 tablet; Refill: 6  2. Coronary artery disease involving native coronary artery of native heart with angina pectoris (HCC) Asymptomatic - atorvastatin (LIPITOR) 40 MG tablet; TAKE 1 TABLET (40 MG TOTAL) BY MOUTH DAILY.  Dispense: 30 tablet; Refill: 6  3. Benign hypertension with CKD (chronic kidney disease) stage III (HCC) Severely uncontrolled Unable to ascertain if she took her medications or not but she insists she took her medication Advised to bring in all her  bottles to her next visit with me in 2 weeks as surprisingly 3 months ago her blood pressure was controlled Counseled on blood pressure goal of less than 130/80, low-sodium, DASH diet, medication compliance, 150 minutes of moderate intensity exercise per week. Discussed medication compliance, adverse effects. - carvedilol (COREG) 12.5 MG tablet; TAKE 1 TABLET (12.5 MG TOTAL) BY MOUTH 2 (TWO) TIMES DAILY WITH A MEAL.  Dispense: 60 tablet; Refill: 6  4. Essential hypertension See #3 - hydrALAZINE (APRESOLINE) 100 MG tablet; Take 1 tablet (100 mg total) by mouth 3 (three) times daily.  Dispense: 90 tablet; Refill: 6  5. Uterine prolapse Uterine pessary in place Follow-up with urogynecology  6. Incontinence of feces, unspecified fecal incontinence type Per son symptoms have been present for 1 year Son states the patient has not been taking Amitiza We will order depends for her She will need work-up for fecal incontinence Obtaining the history using an interpreter has been challenging and it is difficult to ascertain if the incontinence is as a result of diarrhea or she has had incontinence all along. - Ambulatory referral to Gastroenterology  7. Overactive bladder During the whole encounter the patient's son kept using the term urinary incontinence but at the end  of the visit he stated the patient did not have urinary continence which is confusing. Prescription for Detrol LA has already been sent to pharmacy UA positive for UTI hence I will send of culture and treat presumptively with Keflex She does seem to be a bit restless today which is not her baseline concerned about possible dementia or delirium which the son denies I will arrange for home health and PCS services for her - tolterodine (DETROL LA) 2 MG 24 hr capsule; Take 1 capsule (2 mg total) by mouth daily.  Dispense: 30 capsule; Refill: 0 - CBC with Differential/Platelet - Basic Metabolic Panel - POCT URINALYSIS DIP  (CLINITEK)  8. Hypothyroidism, unspecified type Subclinical - T4, free - TSH - T3  9. Acute cystitis without hematuria Treated - Urine Culture - cephALEXin (KEFLEX) 500 MG capsule; Take 1 capsule (500 mg total) by mouth 2 (two) times daily.  Dispense: 6 capsule; Refill: 0   Meds ordered this encounter  Medications   allopurinol (ZYLOPRIM) 100 MG tablet    Sig: Take 1 tablet (100 mg total) by mouth daily.    Dispense:  30 tablet    Refill:  6   atorvastatin (LIPITOR) 40 MG tablet    Sig: TAKE 1 TABLET (40 MG TOTAL) BY MOUTH DAILY.    Dispense:  30 tablet    Refill:  6   carvedilol (COREG) 12.5 MG tablet    Sig: TAKE 1 TABLET (12.5 MG TOTAL) BY MOUTH 2 (TWO) TIMES DAILY WITH A MEAL.    Dispense:  60 tablet    Refill:  6   hydrALAZINE (APRESOLINE) 100 MG tablet    Sig: Take 1 tablet (100 mg total) by mouth 3 (three) times daily.    Dispense:  90 tablet    Refill:  6    Dose change   tolterodine (DETROL LA) 2 MG 24 hr capsule    Sig: Take 1 capsule (2 mg total) by mouth daily.    Dispense:  30 capsule    Refill:  0   cephALEXin (KEFLEX) 500 MG capsule    Sig: Take 1 capsule (500 mg total) by mouth 2 (two) times daily.    Dispense:  6 capsule    Refill:  0    Follow-up: Return in about 2 weeks (around 01/01/2022) for Blood Pressure follow-up ok to double book.     Visit required 47 minutes of patient care including median intraservice time, reviewing previous notes and test results, coordination of care, counseling the patient on overactive bladder symptoms, UTI, fecal incontinence in addition to management of chronic medical conditions.Time also spent ordering medications, investigations and documenting in the chart.  All questions were answered to the patient's satisfaction   Charlott Rakes, MD, FAAFP. [facility] [place], [place]   12/18/2021, 12:24 PM

## 2021-12-18 NOTE — Patient Instructions (Addendum)
Discontinue Amitiza.

## 2021-12-18 NOTE — Telephone Encounter (Signed)
At the request of Dr Margarita Rana I met with the patient and her son to explain the role of PCS aide.  Mandarin interpreter [id number]/ [employer] assisted.  Her son stated that he understood and was in agreement to placing a referral for PCS.  I explained to him that a nurse from [employer], [place], will be calling him to schedule a home assessment to evaluate his mother's needs for assistance.  He was very appreciative of the assistance that could be provided if she is approved.

## 2021-12-19 ENCOUNTER — Other Ambulatory Visit: Payer: Self-pay

## 2021-12-19 LAB — TSH: TSH: 5.64 u[IU]/mL — ABNORMAL HIGH (ref 0.450–4.500)

## 2021-12-19 LAB — BASIC METABOLIC PANEL
BUN/Creatinine Ratio: 18 (ref 12–28)
BUN: 36 mg/dL — ABNORMAL HIGH (ref 8–27)
CO2: 19 mmol/L — ABNORMAL LOW (ref 20–29)
Calcium: 10 mg/dL (ref 8.7–10.3)
Chloride: 82 mmol/L — ABNORMAL LOW (ref 96–106)
Creatinine, Ser: 1.99 mg/dL — ABNORMAL HIGH (ref 0.57–1.00)
Glucose: 103 mg/dL — ABNORMAL HIGH (ref 70–99)
Potassium: 5 mmol/L (ref 3.5–5.2)
Sodium: 120 mmol/L — ABNORMAL LOW (ref 134–144)
eGFR: 25 mL/min/{1.73_m2} — ABNORMAL LOW (ref >59)

## 2021-12-19 LAB — CBC WITH DIFFERENTIAL/PLATELET
Basophils Absolute: 0 10*3/uL (ref 0.0–0.2)
Basos: 0 %
EOS (ABSOLUTE): 0 10*3/uL (ref 0.0–0.4)
Eos: 0 %
Hematocrit: 24.2 % — ABNORMAL LOW (ref 34.0–46.6)
Hemoglobin: 8.7 g/dL — ABNORMAL LOW (ref 11.1–15.9)
Immature Grans (Abs): 0 10*3/uL (ref 0.0–0.1)
Immature Granulocytes: 0 %
Lymphocytes Absolute: 1.2 10*3/uL (ref 0.7–3.1)
Lymphs: 15 %
MCH: 32.8 pg (ref 26.6–33.0)
MCHC: 36 g/dL — ABNORMAL HIGH (ref 31.5–35.7)
MCV: 91 fL (ref 79–97)
Monocytes Absolute: 0.3 10*3/uL (ref 0.1–0.9)
Monocytes: 4 %
Neutrophils Absolute: 6.4 10*3/uL (ref 1.4–7.0)
Neutrophils: 81 %
Platelets: 169 10*3/uL (ref 150–450)
RBC: 2.65 x10E6/uL — CL (ref 3.77–5.28)
RDW: 13.3 % (ref 11.7–15.4)
WBC: 7.9 10*3/uL (ref 3.4–10.8)

## 2021-12-19 LAB — T3: T3, Total: 65 ng/dL — ABNORMAL LOW (ref 71–180)

## 2021-12-19 LAB — T4, FREE: Free T4: 1.66 ng/dL (ref 0.82–1.77)

## 2021-12-20 ENCOUNTER — Encounter (HOSPITAL_COMMUNITY): Payer: Self-pay | Admitting: Emergency Medicine

## 2021-12-20 ENCOUNTER — Inpatient Hospital Stay (HOSPITAL_COMMUNITY)
Admission: EM | Admit: 2021-12-20 | Discharge: 2021-12-22 | DRG: 640 | Disposition: A | Discharge status: Home / Self Care | Payer: Medicaid Other | Attending: Internal Medicine | Admitting: Internal Medicine

## 2021-12-20 ENCOUNTER — Other Ambulatory Visit: Payer: Self-pay

## 2021-12-20 ENCOUNTER — Inpatient Hospital Stay (HOSPITAL_COMMUNITY): Payer: Medicaid Other

## 2021-12-20 DIAGNOSIS — M109 Gout, unspecified: Secondary | ICD-10-CM | POA: Diagnosis present

## 2021-12-20 DIAGNOSIS — N1832 Chronic kidney disease, stage 3b: Secondary | ICD-10-CM | POA: Diagnosis present

## 2021-12-20 DIAGNOSIS — Z79899 Other long term (current) drug therapy: Secondary | ICD-10-CM

## 2021-12-20 DIAGNOSIS — I10 Essential (primary) hypertension: Secondary | ICD-10-CM | POA: Diagnosis present

## 2021-12-20 DIAGNOSIS — E869 Volume depletion, unspecified: Secondary | ICD-10-CM | POA: Diagnosis present

## 2021-12-20 DIAGNOSIS — E43 Unspecified severe protein-calorie malnutrition: Secondary | ICD-10-CM | POA: Diagnosis present

## 2021-12-20 DIAGNOSIS — E039 Hypothyroidism, unspecified: Secondary | ICD-10-CM | POA: Diagnosis present

## 2021-12-20 DIAGNOSIS — Z7982 Long term (current) use of aspirin: Secondary | ICD-10-CM | POA: Diagnosis not present

## 2021-12-20 DIAGNOSIS — E860 Dehydration: Secondary | ICD-10-CM | POA: Diagnosis present

## 2021-12-20 DIAGNOSIS — N390 Urinary tract infection, site not specified: Secondary | ICD-10-CM | POA: Diagnosis present

## 2021-12-20 DIAGNOSIS — G629 Polyneuropathy, unspecified: Secondary | ICD-10-CM | POA: Diagnosis present

## 2021-12-20 DIAGNOSIS — E538 Deficiency of other specified B group vitamins: Secondary | ICD-10-CM | POA: Diagnosis present

## 2021-12-20 DIAGNOSIS — D631 Anemia in chronic kidney disease: Secondary | ICD-10-CM | POA: Diagnosis present

## 2021-12-20 DIAGNOSIS — I2510 Atherosclerotic heart disease of native coronary artery without angina pectoris: Secondary | ICD-10-CM | POA: Diagnosis present

## 2021-12-20 DIAGNOSIS — E871 Hypo-osmolality and hyponatremia: Secondary | ICD-10-CM | POA: Diagnosis present

## 2021-12-20 DIAGNOSIS — I129 Hypertensive chronic kidney disease with stage 1 through stage 4 chronic kidney disease, or unspecified chronic kidney disease: Secondary | ICD-10-CM | POA: Diagnosis present

## 2021-12-20 DIAGNOSIS — D649 Anemia, unspecified: Secondary | ICD-10-CM | POA: Diagnosis present

## 2021-12-20 DIAGNOSIS — N189 Chronic kidney disease, unspecified: Secondary | ICD-10-CM | POA: Diagnosis present

## 2021-12-20 DIAGNOSIS — Z681 Body mass index (BMI) 19 or less, adult: Secondary | ICD-10-CM | POA: Diagnosis not present

## 2021-12-20 LAB — COMPREHENSIVE METABOLIC PANEL
ALT: 14 U/L (ref 0–44)
AST: 34 U/L (ref 15–41)
Albumin: 3.9 g/dL (ref 3.5–5.0)
Alkaline Phosphatase: 72 U/L (ref 38–126)
Anion gap: 14 (ref 5–15)
BUN: 35 mg/dL — ABNORMAL HIGH (ref 8–23)
CO2: 22 mmol/L (ref 22–32)
Calcium: 9.3 mg/dL (ref 8.9–10.3)
Chloride: 86 mmol/L — ABNORMAL LOW (ref 98–111)
Creatinine, Ser: 2.3 mg/dL — ABNORMAL HIGH (ref 0.44–1.00)
GFR, Estimated: 21 mL/min — ABNORMAL LOW (ref >60)
Glucose, Bld: 136 mg/dL — ABNORMAL HIGH (ref 70–99)
Potassium: 4.4 mmol/L (ref 3.5–5.1)
Sodium: 122 mmol/L — ABNORMAL LOW (ref 135–145)
Total Bilirubin: 0.6 mg/dL (ref 0.3–1.2)
Total Protein: 7 g/dL (ref 6.5–8.1)

## 2021-12-20 LAB — CBC WITH DIFFERENTIAL/PLATELET
Abs Immature Granulocytes: 0.02 10*3/uL (ref 0.00–0.07)
Basophils Absolute: 0 10*3/uL (ref 0.0–0.1)
Basophils Relative: 1 %
Eosinophils Absolute: 0 10*3/uL (ref 0.0–0.5)
Eosinophils Relative: 0 %
HCT: 22.3 % — ABNORMAL LOW (ref 36.0–46.0)
Hemoglobin: 7.6 g/dL — ABNORMAL LOW (ref 12.0–15.0)
Immature Granulocytes: 0 %
Lymphocytes Relative: 22 %
Lymphs Abs: 1.1 10*3/uL (ref 0.7–4.0)
MCH: 31.8 pg (ref 26.0–34.0)
MCHC: 34.1 g/dL (ref 30.0–36.0)
MCV: 93.3 fL (ref 80.0–100.0)
Monocytes Absolute: 0.4 10*3/uL (ref 0.1–1.0)
Monocytes Relative: 9 %
Neutro Abs: 3.2 10*3/uL (ref 1.7–7.7)
Neutrophils Relative %: 68 %
Platelets: 151 10*3/uL (ref 150–400)
RBC: 2.39 MIL/uL — ABNORMAL LOW (ref 3.87–5.11)
RDW: 12.2 % (ref 11.5–15.5)
WBC: 4.8 10*3/uL (ref 4.0–10.5)
nRBC: 0 % (ref 0.0–0.2)

## 2021-12-20 LAB — FERRITIN: Ferritin: 249 ng/mL (ref 11–307)

## 2021-12-20 LAB — IRON AND TIBC
Iron: 75 ug/dL (ref 28–170)
Saturation Ratios: 32 % — ABNORMAL HIGH (ref 10.4–31.8)
TIBC: 238 ug/dL — ABNORMAL LOW (ref 250–450)
UIBC: 163 ug/dL

## 2021-12-20 LAB — ABO/RH: ABO/RH(D): O POS

## 2021-12-20 LAB — LACTIC ACID, PLASMA: Lactic Acid, Venous: 0.6 mmol/L (ref 0.5–1.9)

## 2021-12-20 LAB — POC OCCULT BLOOD, ED: Fecal Occult Bld: NEGATIVE

## 2021-12-20 MED ORDER — POLYETHYLENE GLYCOL 3350 17 G PO PACK
17.0000 g | PACK | Freq: Every day | ORAL | Status: DC
  Administered 2021-12-20: 17 g via ORAL
  Filled 2021-12-20 (×3): qty 1

## 2021-12-20 MED ORDER — FESOTERODINE FUMARATE ER 4 MG PO TB24
4.0000 mg | ORAL_TABLET | Freq: Every day | ORAL | Status: DC
  Administered 2021-12-20 – 2021-12-22 (×3): 4 mg via ORAL
  Filled 2021-12-20 (×4): qty 1

## 2021-12-20 MED ORDER — POLYETHYLENE GLYCOL 3350 17 G PO PACK: 17.0000 g | PACK | Freq: Every day | ORAL | Status: DC | PRN

## 2021-12-20 MED ORDER — SODIUM CHLORIDE 0.9 % IV SOLN: INTRAVENOUS | Status: AC

## 2021-12-20 MED ORDER — ENSURE ENLIVE PO LIQD
237.0000 mL | Freq: Two times a day (BID) | ORAL | Status: DC
  Administered 2021-12-21: 237 mL via ORAL
  Filled 2021-12-20: qty 237

## 2021-12-20 MED ORDER — ALLOPURINOL 100 MG PO TABS
100.0000 mg | ORAL_TABLET | Freq: Every day | ORAL | Status: DC
  Administered 2021-12-20 – 2021-12-22 (×3): 100 mg via ORAL
  Filled 2021-12-20 (×4): qty 1

## 2021-12-20 MED ORDER — ATORVASTATIN CALCIUM 40 MG PO TABS
40.0000 mg | ORAL_TABLET | Freq: Every day | ORAL | Status: DC
  Administered 2021-12-20 – 2021-12-22 (×3): 40 mg via ORAL
  Filled 2021-12-20 (×3): qty 1

## 2021-12-20 MED ORDER — HYDRALAZINE HCL 25 MG PO TABS
100.0000 mg | ORAL_TABLET | Freq: Three times a day (TID) | ORAL | Status: DC
  Administered 2021-12-21 – 2021-12-22 (×4): 100 mg via ORAL
  Filled 2021-12-20 (×5): qty 4

## 2021-12-20 MED ORDER — ASPIRIN 81 MG PO TBEC
81.0000 mg | DELAYED_RELEASE_TABLET | Freq: Every day | ORAL | Status: DC
  Administered 2021-12-22: 81 mg via ORAL
  Filled 2021-12-20 (×3): qty 1

## 2021-12-20 MED ORDER — HEPARIN SODIUM (PORCINE) 5000 UNIT/ML IJ SOLN
5000.0000 [IU] | Freq: Two times a day (BID) | INTRAMUSCULAR | Status: DC
  Filled 2021-12-20: qty 1

## 2021-12-20 MED ORDER — PANTOPRAZOLE SODIUM 40 MG PO TBEC
40.0000 mg | DELAYED_RELEASE_TABLET | Freq: Every day | ORAL | Status: DC
  Administered 2021-12-20 – 2021-12-22 (×3): 40 mg via ORAL
  Filled 2021-12-20 (×3): qty 1

## 2021-12-20 MED ORDER — AMLODIPINE BESYLATE 5 MG PO TABS
5.0000 mg | ORAL_TABLET | Freq: Every day | ORAL | Status: DC
  Administered 2021-12-20 – 2021-12-22 (×3): 5 mg via ORAL
  Filled 2021-12-20 (×3): qty 1

## 2021-12-20 MED ORDER — BISACODYL 5 MG PO TBEC: 5.0000 mg | DELAYED_RELEASE_TABLET | Freq: Every day | ORAL | Status: DC | PRN

## 2021-12-20 MED ORDER — CEPHALEXIN 500 MG PO CAPS
500.0000 mg | ORAL_CAPSULE | Freq: Two times a day (BID) | ORAL | Status: DC
  Administered 2021-12-20 – 2021-12-22 (×4): 500 mg via ORAL
  Filled 2021-12-20 (×4): qty 1

## 2021-12-20 MED ORDER — CARVEDILOL 12.5 MG PO TABS
12.5000 mg | ORAL_TABLET | Freq: Two times a day (BID) | ORAL | Status: DC
  Administered 2021-12-21 – 2021-12-22 (×3): 12.5 mg via ORAL
  Filled 2021-12-20 (×3): qty 1

## 2021-12-20 MED ORDER — ISOSORBIDE MONONITRATE ER 60 MG PO TB24
120.0000 mg | ORAL_TABLET | Freq: Every day | ORAL | Status: DC
  Administered 2021-12-20 – 2021-12-22 (×3): 120 mg via ORAL
  Filled 2021-12-20: qty 2
  Filled 2021-12-20: qty 4
  Filled 2021-12-20: qty 2

## 2021-12-20 NOTE — H&P (Signed)
History and Physical    Danielle Houston QVZ:[id number] DOB: [birth year] DOA: 12/20/2021  PCP: Charlott Rakes, MD (Confirm with patient/family/NH records and if not entered, this has to be entered at [facility] point of entry) Patient coming from: Home  I have personally briefly reviewed patient's old medical records in [place]  Chief Complaint: Feeling weak  HPI: Danielle Houston is a 81 y.o. female with medical history significant of CAD, HTN, CKD stage III b, gout, peripheral neuropathy, presented with generalized weakness worsening.  Symptoms started Monday-Tuesday, patient had a diarrhea on Monday then started to feel generalized weakness.  Denies any abdominal pain, no nauseous vomiting.  Patient drinks about 1.0 to 1.5 L a day, family reported patient has good appetite but continues to have weight loss of about 40 pounds since last year.  Family also reported that patient has had alternative diarrhea and constipation since last year.  And this year has been predominantly constipation, with little effect to stool softener.  Once today, patient also started to have dysuria, and patient went to see her PCP, who diagnosed patient with UTI and prescribed Keflex but patient has not started to take it yet.  Patient still has some dysuria at this point, no back pain no fever chills.  On that office visit, blood work was done sodium 120, TSH 6.0, normal T4, slight decrease of T3.  ED Course: Borderline bradycardic, blood pressure elevated no hypoxia afebrile.  Sodium=120> 122, creatinine 2.3 compared to baseline 1.9-2.3.  Review of Systems: As per HPI otherwise 14 point review of systems negative.    Past Medical History:  Diagnosis Date   Coronary artery disease    Hypertension    Hypothyroidism     Past Surgical History:  Procedure Laterality Date   CARDIAC CATHETERIZATION     CORONARY ANGIOPLASTY       reports that she has never smoked. She has never used smokeless tobacco. She reports that  she does not drink alcohol and does not use drugs.  No Known Allergies  Family History  Problem Relation Age of Onset   CAD Neg Hx     Prior to Admission medications   Medication Sig Start Date End Date Taking? Authorizing Provider  allopurinol (ZYLOPRIM) 100 MG tablet Take 1 tablet (100 mg total) by mouth daily. 12/18/21  Yes Charlott Rakes, MD  aspirin EC 81 MG tablet Take 1 tablet (81 mg total) by mouth daily. Swallow whole. 10/26/19  Yes Newlin, Charlane Ferretti, MD  atorvastatin (LIPITOR) 40 MG tablet TAKE 1 TABLET (40 MG TOTAL) BY MOUTH DAILY. Patient taking differently: Take 40 mg by mouth daily. 12/18/21 12/18/22 Yes Newlin, Charlane Ferretti, MD  carvedilol (COREG) 12.5 MG tablet TAKE 1 TABLET (12.5 MG TOTAL) BY MOUTH 2 (TWO) TIMES DAILY WITH A MEAL. 12/18/21  Yes Charlott Rakes, MD  cephALEXin (KEFLEX) 500 MG capsule Take 1 capsule (500 mg total) by mouth 2 (two) times daily. 12/18/21  Yes Charlott Rakes, MD  diclofenac Sodium (VOLTAREN) 1 % GEL APPLY 2 Grams TOPICALLY 4 (FOUR) TIMES DAILY as needed for PAIN Patient taking differently: Apply 2 g topically daily as needed (For pain). 12/03/21  Yes Charlott Rakes, MD  hydrALAZINE (APRESOLINE) 100 MG tablet Take 1 tablet (100 mg total) by mouth 3 (three) times daily. 12/18/21 12/18/22 Yes Charlott Rakes, MD  isosorbide mononitrate (IMDUR) 120 MG 24 hr tablet Take 1 tablet (120 mg total) by mouth daily. 08/02/21  Yes Newlin, Charlane Ferretti, MD  lidocaine (LIDODERM) 5 % Place 1 patch  onto the skin daily. Remove & Discard patch within 12 hours or as directed by MD Patient taking differently: Place 1 patch onto the skin daily as needed (For pain). Remove & Discard patch within 12 hours or as directed by MD 06/05/21  Yes Charlott Rakes, MD  omeprazole (PRILOSEC) 20 MG capsule TAKE 1 CAPSULE (20 MG TOTAL) BY MOUTH DAILY. Patient taking differently: Take 20 mg by mouth daily. 11/01/21 11/01/22 Yes Charlott Rakes, MD  polyethylene glycol powder (GLYCOLAX/MIRALAX) 17  GM/SCOOP powder Take 17 g by mouth daily. 11/29/20  Yes Charlott Rakes, MD  tolterodine (DETROL LA) 2 MG 24 hr capsule Take 1 capsule (2 mg total) by mouth daily. 12/18/21  Yes Charlott Rakes, MD  gabapentin (NEURONTIN) 300 MG capsule Take 1 capsule (300 mg total) by mouth at bedtime. 01/23/20 04/24/20  Charlott Rakes, MD    Physical Exam: Vitals:   12/20/21 1635 12/20/21 1700 12/20/21 1720 12/20/21 1730  BP: (!) 187/62 (!) 180/56  (!) 179/65  Pulse: 64 65  63  Resp: (!) 25 (!) 26  (!) 21  Temp:   97.8 F (36.6 C)   TempSrc:   Oral   SpO2: 99% 100%  99%  Weight:      Height:        Constitutional: NAD, calm, comfortable Vitals:   12/20/21 1635 12/20/21 1700 12/20/21 1720 12/20/21 1730  BP: (!) 187/62 (!) 180/56  (!) 179/65  Pulse: 64 65  63  Resp: (!) 25 (!) 26  (!) 21  Temp:   97.8 F (36.6 C)   TempSrc:   Oral   SpO2: 99% 100%  99%  Weight:      Height:       Eyes: PERRL, lids and conjunctivae normal ENMT: Mucous membranes are dry. Posterior pharynx clear of any exudate or lesions.Normal dentition.  Neck: normal, supple, no masses, no thyromegaly Respiratory: clear to auscultation bilaterally, no wheezing, no crackles. Normal respiratory effort. No accessory muscle use.  Cardiovascular: Regular rate and rhythm, no murmurs / rubs / gallops. No extremity edema. 2+ pedal pulses. No carotid bruits.  Abdomen: no tenderness, no masses palpated. No hepatosplenomegaly. Bowel sounds positive.  Musculoskeletal: no clubbing / cyanosis. No joint deformity upper and lower extremities. Good ROM, no contractures. Normal muscle tone.  Skin: no rashes, lesions, ulcers. No induration Neurologic: CN 2-12 grossly intact. Sensation intact, DTR normal. Strength 5/5 in all 4.  Psychiatric: Normal judgment and insight. Alert and oriented x 3. Normal mood.     Labs on Admission: I have personally reviewed following labs and imaging studies  CBC: Recent Labs  Lab 12/18/21 1207 12/20/21 1339   WBC 7.9 4.8  NEUTROABS 6.4 3.2  HGB 8.7* 7.6*  HCT 24.2* 22.3*  MCV 91 93.3  PLT 169 098   Basic Metabolic Panel: Recent Labs  Lab 12/18/21 1207 12/20/21 1339  NA 120* 122*  K 5.0 4.4  CL 82* 86*  CO2 19* 22  GLUCOSE 103* 136*  BUN 36* 35*  CREATININE 1.99* 2.30*  CALCIUM 10.0 9.3   GFR: Estimated Creatinine Clearance: 14 mL/min (A) (by C-G formula based on SCr of 2.3 mg/dL (H)). Liver Function Tests: Recent Labs  Lab 12/20/21 1339  AST 34  ALT 14  ALKPHOS 72  BILITOT 0.6  PROT 7.0  ALBUMIN 3.9   No results for input(s): "LIPASE", "AMYLASE" in the last 168 hours. No results for input(s): "AMMONIA" in the last 168 hours. Coagulation Profile: No results for input(s): "INR", "PROTIME"  in the last 168 hours. Cardiac Enzymes: No results for input(s): "CKTOTAL", "CKMB", "CKMBINDEX", "TROPONINI" in the last 168 hours. BNP (last 3 results) No results for input(s): "PROBNP" in the last 8760 hours. HbA1C: No results for input(s): "HGBA1C" in the last 72 hours. CBG: No results for input(s): "GLUCAP" in the last 168 hours. Lipid Profile: No results for input(s): "CHOL", "HDL", "LDLCALC", "TRIG", "CHOLHDL", "LDLDIRECT" in the last 72 hours. Thyroid Function Tests: Recent Labs    12/18/21 1207  TSH 5.640*  FREET4 1.66   Anemia Panel: No results for input(s): "VITAMINB12", "FOLATE", "FERRITIN", "TIBC", "IRON", "RETICCTPCT" in the last 72 hours. Urine analysis:    Component Value Date/Time   BILIRUBINUR negative 12/18/2021 1148   BILIRUBINUR Negative 05/14/2021 1534   KETONESUR negative 12/18/2021 1148   PROTEINUR Negative 05/14/2021 1534   UROBILINOGEN 0.2 12/18/2021 1148   NITRITE Negative 12/18/2021 1148   NITRITE Negative 05/14/2021 1534   LEUKOCYTESUR Small (1+) (A) 12/18/2021 1148    Radiological Exams on Admission: No results found.  EKG: Independently reviewed.  Normal sinus rhythm, no acute ST changes.  Assessment/Plan Principal Problem:    Hyponatremia  (please populate well all problems here in Problem List. (For example, if patient is on BP meds at home and you resume or decide to hold them, it is a problem that needs to be her. Same for CAD, COPD, HLD and so on)  Symptomatic hyponatremia -Acute-subacute -Volume status: Patient appears to have volume contraction on physical exam despite blood pressure elevated. -Hyponatremia study sent, urine osmolality, serum osmolality urine sodium -Given status of volume contraction, will try short course of IV fluid for 5 hours, recheck sodium level tonight, and tomorrow morning.  Any night correct sodium level <0.5 mEq every hour.  HTN, uncontrolled -Continue Coreg -Continue hydralazine and Imdur regimen -Add amlodipine  Acute on chronic anemia -Denies any abdominal pain, denies any blood in the stool or dark-colored stool -Check iron study, and FOBT  CKD stage IIIb -Volume contracted, IV fluid, check renal ultrasound.  Severe protein calorie malnutrition -BMI= 18 -Consult nutrition, start Ensure  DVT prophylaxis: Heparin subcu Code Status: Full code Family Communication: Son at bedside Disposition Plan: Patient sick with symptomatic hyponatremia, expect more than 2 midnight hospital stay. Consults called: None Admission status: Tele admit   Lequita Halt MD [facility] Pager [phone]  12/20/2021, 6:51 PM

## 2021-12-20 NOTE — ED Notes (Signed)
Pt agreed for blood transfusion and son signed e-consent and verified and witnessed by this RN.

## 2021-12-20 NOTE — ED Provider Notes (Signed)
[facility] EMERGENCY DEPARTMENT Provider Note   CSN: [id number] Arrival date & time: 12/20/21  1308     History  Chief Complaint  Patient presents with   Abnormal Lab    Danielle Houston is a 81 y.o. female.  81 year old female presents with increasing weakness times several months.  Patient was at her PCPs office for checkup and was found to have hyponatremia as well as worsening anemia.  Patient does not have any black or bloody stools.  Has not been vomiting blood.  No recent fevers, chest or abdominal discomfort.  Occasion changes.  No urinary symptoms currently.  Was sent here for further evaluation.       Home Medications Prior to Admission medications   Medication Sig Start Date End Date Taking? Authorizing Provider  allopurinol (ZYLOPRIM) 100 MG tablet Take 1 tablet (100 mg total) by mouth daily. 12/18/21   Charlott Rakes, MD  aspirin EC 81 MG tablet Take 1 tablet (81 mg total) by mouth daily. Swallow whole. 10/26/19   Charlott Rakes, MD  atorvastatin (LIPITOR) 40 MG tablet TAKE 1 TABLET (40 MG TOTAL) BY MOUTH DAILY. 12/18/21 12/18/22  Charlott Rakes, MD  carvedilol (COREG) 12.5 MG tablet TAKE 1 TABLET (12.5 MG TOTAL) BY MOUTH 2 (TWO) TIMES DAILY WITH A MEAL. 12/18/21   Charlott Rakes, MD  cephALEXin (KEFLEX) 500 MG capsule Take 1 capsule (500 mg total) by mouth 2 (two) times daily. 12/18/21   Charlott Rakes, MD  diclofenac Sodium (VOLTAREN) 1 % GEL APPLY 2 Grams TOPICALLY 4 (FOUR) TIMES DAILY as needed for PAIN 12/03/21   Charlott Rakes, MD  hydrALAZINE (APRESOLINE) 100 MG tablet Take 1 tablet (100 mg total) by mouth 3 (three) times daily. 12/18/21 12/18/22  Charlott Rakes, MD  isosorbide mononitrate (IMDUR) 120 MG 24 hr tablet Take 1 tablet (120 mg total) by mouth daily. 08/02/21   Charlott Rakes, MD  lidocaine (LIDODERM) 5 % Place 1 patch onto the skin daily. Remove & Discard patch within 12 hours or as directed by MD 06/05/21   Charlott Rakes, MD  omeprazole  (PRILOSEC) 20 MG capsule TAKE 1 CAPSULE (20 MG TOTAL) BY MOUTH DAILY. 11/01/21 11/01/22  Charlott Rakes, MD  polyethylene glycol powder (GLYCOLAX/MIRALAX) 17 GM/SCOOP powder Take 17 g by mouth daily. 11/29/20   Charlott Rakes, MD  tolterodine (DETROL LA) 2 MG 24 hr capsule Take 1 capsule (2 mg total) by mouth daily. 12/18/21   Charlott Rakes, MD  gabapentin (NEURONTIN) 300 MG capsule Take 1 capsule (300 mg total) by mouth at bedtime. 01/23/20 04/24/20  Charlott Rakes, MD      Allergies    Patient has no known allergies.    Review of Systems   Review of Systems  All other systems reviewed and are negative.   Physical Exam Updated Vital Signs BP (!) 187/62   Pulse 64   Temp 97.6 F (36.4 C) (Oral)   Resp (!) 25   Ht 1.575 m (5\' 2" )   Wt 46.3 kg   LMP  (LMP Unknown)   SpO2 99%   BMI 18.66 kg/m  Physical Exam Vitals and nursing note reviewed.  Constitutional:      General: She is not in acute distress.    Appearance: Normal appearance. She is well-developed. She is not toxic-appearing.  HENT:     Head: Normocephalic and atraumatic.  Eyes:     General: Lids are normal.     Conjunctiva/sclera: Conjunctivae normal.     Pupils: Pupils are  equal, round, and reactive to light.  Neck:     Thyroid: No thyroid mass.     Trachea: No tracheal deviation.  Cardiovascular:     Rate and Rhythm: Normal rate and regular rhythm.     Heart sounds: Normal heart sounds. No murmur heard.    No gallop.  Pulmonary:     Effort: Pulmonary effort is normal. No respiratory distress.     Breath sounds: Normal breath sounds. No stridor. No decreased breath sounds, wheezing, rhonchi or rales.  Abdominal:     General: There is no distension.     Palpations: Abdomen is soft.     Tenderness: There is no abdominal tenderness. There is no rebound.  Musculoskeletal:        General: No tenderness. Normal range of motion.     Cervical back: Normal range of motion and neck supple.  Skin:    General: Skin is  warm and dry.     Findings: No abrasion or rash.  Neurological:     Mental Status: She is alert and oriented to person, place, and time. Mental status is at baseline.     GCS: GCS eye subscore is 4. GCS verbal subscore is 5. GCS motor subscore is 6.     Cranial Nerves: No cranial nerve deficit.     Sensory: No sensory deficit.     Motor: Motor function is intact.  Psychiatric:        Attention and Perception: Attention normal.        Speech: Speech normal.        Behavior: Behavior normal.     ED Results / Procedures / Treatments   Labs (all labs ordered are listed, but only abnormal results are displayed) Labs Reviewed  COMPREHENSIVE METABOLIC PANEL - Abnormal; Notable for the following components:      Result Value   Sodium 122 (*)    Chloride 86 (*)    Glucose, Bld 136 (*)    BUN 35 (*)    Creatinine, Ser 2.30 (*)    GFR, Estimated 21 (*)    All other components within normal limits  CBC WITH DIFFERENTIAL/PLATELET - Abnormal; Notable for the following components:   RBC 2.39 (*)    Hemoglobin 7.6 (*)    HCT 22.3 (*)    All other components within normal limits  POC OCCULT BLOOD, ED  TYPE AND SCREEN  ABO/RH    EKG EKG Interpretation  Date/Time:  Friday December 20 2021 13:13:31 EDT Ventricular Rate:  65 PR Interval:  160 QRS Duration: 82 QT Interval:  426 QTC Calculation: 443 R Axis:   45 Text Interpretation: Normal sinus rhythm Normal ECG No previous ECGs available Confirmed by Lacretia Leigh ([id number]) on 12/20/2021 4:19:06 PM  Radiology No results found.  Procedures Procedures    Medications Ordered in ED Medications - No data to display  ED Course/ Medical Decision Making/ A&P                           Medical Decision Making  Patient with evidence of hyponatremia here 122.  Patient has evidence of chronic kidney disease which is unchanged.  She has no evidence of seizure activity.  Will be given fluids here.  His hemoglobin is 7.6.  Will have stools  guaiac.  Results discussed with patient and family who are at bedside.  A video interpreter was used.  Patient will require admission.  Will consult hospitalist  Final Clinical Impression(s) / ED Diagnoses Final diagnoses:  None    Rx / DC Orders ED Discharge Orders     None         Lacretia Leigh, MD 12/20/21 1704

## 2021-12-20 NOTE — ED Triage Notes (Signed)
Using medical interpreter-Patient arrives in wheelchair with son. Patient was seen Wednesday by PCP and called today for low sodium and hemoglobin. Denies any pain. Not eating or drinking as much over the past few days. Has been feeling weaker over past few days.

## 2021-12-20 NOTE — ED Provider Triage Note (Signed)
Emergency Medicine Provider Triage Evaluation Note  Danielle Houston , a 81 y.o. female  was evaluated in triage.  Pt accompanied by her son, presenting today due to abnormal labs.  Seen Wednesday by PCP, and notified via phone today due to low sodium and Hgb.  Recommended to come to the ED for further evaluation.  Patient without any active complaints.  Patient's son denies recent fevers, N/V, constipation, or complaints of abdominal pain.    Review of Systems  Positive:  Negative: See above  Physical Exam  BP (!) 179/63 (BP Location: Left Arm)   Pulse 68   Temp 97.6 F (36.4 C) (Oral)   Resp 14   Ht 5\' 2"  (1.575 m)   Wt 46.3 kg   LMP  (LMP Unknown)   SpO2 100%   BMI 18.66 kg/m  Gen:   Awake, no distress   Resp:  Normal effort  MSK:   Moves extremities without difficulty  Other:  Abdomen soft, non-TTP  Medical Decision Making  Medically screening exam initiated at 1:38 PM.  Appropriate orders placed.  Danielle Houston was informed that the remainder of the evaluation will be completed by another provider, this initial triage assessment does not replace that evaluation, and the importance of remaining in the ED until their evaluation is complete.     Prince Rome, PA-C 70/96/43 1340

## 2021-12-21 DIAGNOSIS — D649 Anemia, unspecified: Secondary | ICD-10-CM

## 2021-12-21 LAB — PREPARE RBC (CROSSMATCH)

## 2021-12-21 LAB — CBC
HCT: 19.5 % — ABNORMAL LOW (ref 36.0–46.0)
Hemoglobin: 6.8 g/dL — CL (ref 12.0–15.0)
MCH: 31.6 pg (ref 26.0–34.0)
MCHC: 34.9 g/dL (ref 30.0–36.0)
MCV: 90.7 fL (ref 80.0–100.0)
Platelets: 148 10*3/uL — ABNORMAL LOW (ref 150–400)
RBC: 2.15 MIL/uL — ABNORMAL LOW (ref 3.87–5.11)
RDW: 12.1 % (ref 11.5–15.5)
WBC: 5.6 10*3/uL (ref 4.0–10.5)
nRBC: 0 % (ref 0.0–0.2)

## 2021-12-21 LAB — BASIC METABOLIC PANEL
Anion gap: 11 (ref 5–15)
BUN: 31 mg/dL — ABNORMAL HIGH (ref 8–23)
CO2: 21 mmol/L — ABNORMAL LOW (ref 22–32)
Calcium: 9 mg/dL (ref 8.9–10.3)
Chloride: 93 mmol/L — ABNORMAL LOW (ref 98–111)
Creatinine, Ser: 2 mg/dL — ABNORMAL HIGH (ref 0.44–1.00)
GFR, Estimated: 25 mL/min — ABNORMAL LOW (ref >60)
Glucose, Bld: 85 mg/dL (ref 70–99)
Potassium: 3.9 mmol/L (ref 3.5–5.1)
Sodium: 125 mmol/L — ABNORMAL LOW (ref 135–145)

## 2021-12-21 LAB — HEMOGLOBIN AND HEMATOCRIT, BLOOD
HCT: 19.5 % — ABNORMAL LOW (ref 36.0–46.0)
Hemoglobin: 7 g/dL — ABNORMAL LOW (ref 12.0–15.0)

## 2021-12-21 LAB — URINE CULTURE

## 2021-12-21 LAB — OSMOLALITY: Osmolality: 263 mOsm/kg — ABNORMAL LOW (ref 275–295)

## 2021-12-21 LAB — RETICULOCYTES
Immature Retic Fract: 1.4 % — ABNORMAL LOW (ref 2.3–15.9)
RBC.: 2.19 MIL/uL — ABNORMAL LOW (ref 3.87–5.11)
Retic Count, Absolute: 28.3 10*3/uL (ref 19.0–186.0)
Retic Ct Pct: 1.3 % (ref 0.4–3.1)

## 2021-12-21 LAB — LACTIC ACID, PLASMA: Lactic Acid, Venous: 0.5 mmol/L (ref 0.5–1.9)

## 2021-12-21 LAB — SODIUM
Sodium: 124 mmol/L — ABNORMAL LOW (ref 135–145)
Sodium: 125 mmol/L — ABNORMAL LOW (ref 135–145)

## 2021-12-21 MED ORDER — CYANOCOBALAMIN 1000 MCG/ML IJ SOLN
1000.0000 ug | Freq: Every day | INTRAMUSCULAR | Status: DC
  Administered 2021-12-21 – 2021-12-22 (×2): 1000 ug via INTRAMUSCULAR
  Filled 2021-12-21 (×2): qty 1

## 2021-12-21 MED ORDER — RENA-VITE PO TABS
1.0000 | ORAL_TABLET | Freq: Every day | ORAL | Status: DC
  Administered 2021-12-21: 1 via ORAL
  Filled 2021-12-21: qty 1

## 2021-12-21 MED ORDER — SODIUM CHLORIDE 0.9 % IV SOLN: INTRAVENOUS | Status: DC

## 2021-12-21 MED ORDER — CYANOCOBALAMIN 1000 MCG/ML IJ SOLN: 1000.0000 ug | INTRAMUSCULAR | Status: DC

## 2021-12-21 MED ORDER — ENSURE ENLIVE PO LIQD
237.0000 mL | Freq: Three times a day (TID) | ORAL | Status: DC
  Administered 2021-12-21 (×2): 237 mL via ORAL

## 2021-12-21 MED ORDER — SODIUM CHLORIDE 0.9% IV SOLUTION: Freq: Once | INTRAVENOUS | Status: AC

## 2021-12-21 NOTE — Progress Notes (Signed)
Initial Nutrition Assessment  DOCUMENTATION CODES:   Not applicable  INTERVENTION:   Liberalize diet to regular due to no clinically need for renal/CM diet.  Meal ordering with assist Encourage good PO intake Renal Multivitamin w/ minerals daily Continue Ensure Enlive po TID, each supplement provides 350 kcal and 20 grams of protein. Recommend SLP evaluation for reported difficulty swallowing. MD notified  NUTRITION DIAGNOSIS:   Increased nutrient needs related to chronic illness as evidenced by estimated needs.  GOAL:   Patient will meet greater than or equal to 90% of their needs  MONITOR:   PO intake, Supplement acceptance, Labs, I & O's, Weight trends  REASON FOR ASSESSMENT:   Consult Assessment of nutrition requirement/status, Calorie Count  ASSESSMENT:   81 y.o. female presented to the ED with increased weakness. PMH includes CAD, HTN, hypothyroidism, and CKD IV. Pt admitted with hyponatremia.   RD working remotely at time of assessment. Pt noted to speak Mandarin.  Discussed case with RN.RN reports that pt shared she has a difficulty swallowing medications at home. RN crushed all morning meds for her. Stats that pt also shared she sticks to soft/"mushy" food at home. RN reports that pt is accepting and drinking Ensures.  No meal completions documented.   Per H&P, family reports a 40# weight loss since last year and that pt has a good appetite. Per EMR, pt weight has remained relatively stable over the past year.   Medications reviewed and include: Keflex, Vitamin B12, Protonix, Miralax Labs reviewed: Sodium 125 (low), BUN 31, Creatinine 2.0   NUTRITION - FOCUSED PHYSICAL EXAM:  Deferred to follow-up.  Diet Order:   Diet Order             Diet regular Room service appropriate? Yes with Assist; Fluid consistency: Thin; Fluid restriction: 1200 mL Fluid  Diet effective now                   EDUCATION NEEDS:   No education needs have been identified  at this time  Skin:  Skin Assessment: Reviewed RN Assessment  Last BM:  10/6  Height:   Ht Readings from Last 1 Encounters:  12/20/21 5\' 2"  (1.575 m)    Weight:   Wt Readings from Last 1 Encounters:  12/20/21 46.3 kg    Ideal Body Weight:  50 kg  BMI:  Body mass index is 18.66 kg/m.  Estimated Nutritional Needs:   Kcal:  1500-1700  Protein:  75-90 grams  Fluid:  >/= 1.5 L   Hermina Barters RD, LDN Clinical Dietitian See [facility] for contact information.

## 2021-12-21 NOTE — Progress Notes (Signed)
[facility] night cross cover note:   I was notified by RN of Hgb 6.8 this AM, compared to 7.6 yesterday, with interval IVF administration noted. Appears hemodynamically stable (bp 122/65, HR in 70's). Type and screen ordered. Repeat H&H ordered, with plan to transfuse if this updated Hgb value remains less than 7.     Babs Bertin, DO Hospitalist

## 2021-12-21 NOTE — Evaluation (Signed)
Physical Therapy Evaluation Patient Details Name: Danielle Houston MRN: [id number] DOB: September 30, 1940 Today's Date: 12/21/2021  History of Present Illness  81 y.o. [place] female (speaks Mandarin) who presents with weakness.  Pt dx with hyponatremia, acute on chronic anemia, severe protein calorie malnutrition.  Pt with other significant PMH of HTN, CKD III, CAD.  Clinical Impression  Video interpreter used throughout, however, pt did not understand interpreter well and difficult to say if it is because she is HOH or mildly confused.  Son was not present during my evaluation.  Pt was able to mobilize about the room, assist in her own pericare, and walk to the nurses station and back with min hand held assist.  She is slow to move, but agreeable to do it.  VSS per monitor throughout and as far as I could tell (and attempted to ask several times) she had no reports of lightheadedness or dizziness with mobility.  She would benefit from home therapy follow up to help get her stronger and more steady on her feet if family is agreeable.       Recommendations for follow up therapy are one component of a multi-disciplinary discharge planning process, led by the attending physician.  Recommendations may be updated based on patient status, additional functional criteria and insurance authorization.  Follow Up Recommendations Home health PT      Assistance Recommended at Discharge Frequent or constant Supervision/Assistance  Patient can return home with the following  A little help with walking and/or transfers;A little help with bathing/dressing/bathroom;Assistance with cooking/housework;Direct supervision/assist for medications management;Direct supervision/assist for financial management;Assist for transportation;Help with stairs or ramp for entrance    Equipment Recommendations None recommended by PT  Recommendations for Other Services       Functional Status Assessment Patient has had a recent decline in  their functional status and demonstrates the ability to make significant improvements in function in a reasonable and predictable amount of time.     Precautions / Restrictions Precautions Precautions: Fall Precaution Comments: interpreter needed (used stata video interpreter: Saralynn #[id number]      Mobility  Bed Mobility Overal bed mobility: Needs Assistance Bed Mobility: Supine to Sit, Sit to Supine     Supine to sit: Mod assist, HOB elevated Sit to supine: Mod assist, HOB elevated   General bed mobility comments: Mod assist to help elevate trunk and bring both legs off of the side of the bed, same for return to supine.    Transfers Overall transfer level: Needs assistance Equipment used: 1 person hand held assist Transfers: Sit to/from Stand, Bed to chair/wheelchair/BSC Sit to Stand: Min assist Stand pivot transfers: Min assist         General transfer comment: Min hand held assist to stand from bed, chair, and low toilet seat.    Ambulation/Gait Ambulation/Gait assistance: Min Web designer (Feet): 100 Feet Assistive device: IV Pole Gait Pattern/deviations: Step-through pattern, Shuffle Gait velocity: decreased Gait velocity interpretation: <1.31 ft/sec, indicative of household ambulator   General Gait Details: pt walked to the bathroom, then down the hallway with min assist, one hand with therapist and one hand on IV pole, eventually transitioned to two hands on IV pole, would not take to RW when offered.  Stairs            Wheelchair Mobility    Modified Rankin (Stroke Patients Only)       Balance Overall balance assessment: Needs assistance Sitting-balance support: Feet supported, Bilateral upper extremity supported Sitting balance-Leahy Scale: Fair  Sitting balance - Comments: close supervision in sitting   Standing balance support: Single extremity supported, Bilateral upper extremity supported Standing balance-Leahy Scale: Poor Standing  balance comment: needs external support from hands in standing for balance.                             Pertinent Vitals/Pain Pain Assessment Pain Assessment: No/denies pain    Home Living Family/patient expects to be discharged to:: Private residence Living Arrangements: Children (son) Available Help at Discharge: Family               Additional Comments: Pt is either confused or very HOH, had difficulty even with the interpreter.  Son not in room to help fill in gaps.    Prior Function                       Hand Dominance        Extremity/Trunk Assessment   Upper Extremity Assessment Upper Extremity Assessment: Generalized weakness    Lower Extremity Assessment Lower Extremity Assessment: Generalized weakness    Cervical / Trunk Assessment Cervical / Trunk Assessment: Normal  Communication   Communication: Interpreter utilized;HOH  Cognition Arousal/Alertness: Awake/alert Behavior During Therapy: WFL for tasks assessed/performed Overall Cognitive Status: No family/caregiver present to determine baseline cognitive functioning                                 General Comments: Pt had difficulty responding to qestions, commands, even with video interpreter.  Son was not present during my evaluation, so very little PLOF obtained.        General Comments      Exercises     Assessment/Plan    PT Assessment Patient needs continued PT services  PT Problem List Decreased strength;Decreased activity tolerance;Decreased balance;Decreased mobility;Decreased cognition;Decreased knowledge of use of DME       PT Treatment Interventions DME instruction;Gait training;Functional mobility training;Stair training;Therapeutic activities;Therapeutic exercise;Balance training;Neuromuscular re-education;Cognitive remediation;Patient/family education    PT Goals (Current goals can be found in the Care Plan section)  Acute Rehab PT  Goals Patient Stated Goal: unable to state, however, agreeable to walking PT Goal Formulation: Patient unable to participate in goal setting Time For Goal Achievement: 01/04/22 Potential to Achieve Goals: Good    Frequency Min 3X/week     Co-evaluation               AM-PAC PT "6 Clicks" Mobility  Outcome Measure Help needed turning from your back to your side while in a flat bed without using bedrails?: A Little Help needed moving from lying on your back to sitting on the side of a flat bed without using bedrails?: A Lot Help needed moving to and from a bed to a chair (including a wheelchair)?: A Lot Help needed standing up from a chair using your arms (e.g., wheelchair or bedside chair)?: A Little Help needed to walk in hospital room?: A Little Help needed climbing 3-5 steps with a railing? : A Lot 6 Click Score: 15    End of Session Equipment Utilized During Treatment: Gait belt Activity Tolerance: Patient limited by fatigue Patient left: in bed;with call bell/phone within reach;with bed alarm set Nurse Communication: Mobility status PT Visit Diagnosis: Muscle weakness (generalized) (M62.81);Difficulty in walking, not elsewhere classified (R26.2)    Time: 1428-1510 PT Time Calculation (min) (ACUTE ONLY): 42 min  Charges:   PT Evaluation $PT Eval Moderate Complexity: 1 Mod PT Treatments $Gait Training: 8-22 mins $Therapeutic Activity: 8-22 mins       Verdene Lennert, PT, DPT  Acute Rehabilitation Secure chat is best for contact #[phone] office    12/21/2021, 3:21 PM

## 2021-12-21 NOTE — Progress Notes (Signed)
PROGRESS NOTE    Danielle Houston  PIR:[id number] DOB: September 23, 1940 DOA: 12/20/2021 PCP: Charlott Rakes, MD  Chief Complaint  Patient presents with   Abnormal Lab    Brief Narrative:     HPI: Danielle Houston is a 81 y.o. female with medical history significant of CAD, HTN, CKD stage III b, gout, peripheral neuropathy, presented with generalized weakness worsening.   Symptoms started Monday-Tuesday, patient had a diarrhea on Monday then started to feel generalized weakness.  Denies any abdominal pain, no nauseous vomiting.  Patient drinks about 1.0 to 1.5 L a day, family reported patient has good appetite but continues to have weight loss of about 40 pounds since last year.  Family also reported that patient has had alternative diarrhea and constipation since last year.  And this year has been predominantly constipation, with little effect to stool softener.  Once today, patient also started to have dysuria, and patient went to see her PCP, who diagnosed patient with UTI and prescribed Keflex but patient has not started to take it yet.  Patient still has some dysuria at this point, no back pain no fever chills.  On that office visit, blood work was done sodium 120, TSH 6.0, normal T4, slight decrease of T3.   ED Course: Borderline bradycardic, blood pressure elevated no hypoxia afebrile.  Sodium=120> 122, creatinine 2.3 compared to baseline 1.9-2.3.    Assessment & Plan:   Principal Problem:   Hyponatremia    Symptomatic hyponatremia -Acute-subacute -Volume status: Patient appears to have volume contraction on physical exam despite blood pressure elevated. -Urine osmolality and serum osmolality, urine sodium -Volume depletion and dehydration, granddaughter report poor oral intake and fluid intake recently, continue with gentle hydration with IV normal saline -Avoid rapid correction   HTN, uncontrolled -Continue Coreg -Continue hydralazine and Imdur regimen -Add amlodipine   Acute on  chronic anemia -Denies any abdominal pain, denies any blood in the stool or dark-colored stool -Hemoccult is negative -Anemia panel significant for good iron and iron stores, but low B12 stores, will start on B12 supplements -Will anemia of chronic kidney disease she would benefit from Procrit as an outpatient -Globin this morning 6.8, given she is symptomatic presents with weakness will transfuse 1 unit PRBC, I have discussed with patient granddaughter by phone who assisted with translation as well as well as tele interpreter do not speak her dialect, patient is agreeable for 1 unit PRBC transfusion   CKD stage IIIb -Volume contracted, IV fluid, check renal ultrasound.   Severe protein calorie malnutrition -BMI= 18 -Consult nutrition, start Ensure     DVT prophylaxis: SCD Code Status: Full Family Communication: Discussed with granddaughter Weber Cooks [phone] Disposition:   Status is: Inpatient    Consultants:  None Subjective:  She reports she wanted to urinate, otherwise she denies any complaints Objective: Vitals:   12/20/21 2100 12/21/21 0343 12/21/21 0800 12/21/21 1300  BP: 127/65 122/65 125/65 95/61  Pulse: 68 74 83 85  Resp: 20 20 16 16   Temp: 97.7 F (36.5 C) 98 F (36.7 C) 97.8 F (36.6 C) 98 F (36.7 C)  TempSrc: Oral Oral Oral Oral  SpO2: 96% 97% 99% 99%  Weight: 46.3 kg     Height: 5\' 2"  (1.575 m)      No intake or output data in the 24 hours ending 12/21/21 1524 Filed Weights   12/20/21 1326 12/20/21 2100  Weight: 46.3 kg 46.3 kg    Examination:  Awake Alert,  Symmetrical Chest wall movement, Good air  movement bilaterally, CTAB RRR,No Gallops,Rubs or new Murmurs, No Parasternal Heave +ve B.Sounds, Abd Soft, No tenderness, No rebound - guarding or rigidity. No Cyanosis, Clubbing or edema, No new Rash or bruise      Data Reviewed: I have personally reviewed following labs and imaging studies  CBC: Recent Labs  Lab 12/18/21 1207 12/20/21 1339  12/21/21 0317 12/21/21 0811  WBC 7.9 4.8 5.6  --   NEUTROABS 6.4 3.2  --   --   HGB 8.7* 7.6* 6.8* 7.0*  HCT 24.2* 22.3* 19.5* 19.5*  MCV 91 93.3 90.7  --   PLT 169 151 148*  --     Basic Metabolic Panel: Recent Labs  Lab 12/18/21 1207 12/20/21 1339 12/20/21 2343 12/21/21 0317 12/21/21 0811  NA 120* 122* 124* 125* 125*  K 5.0 4.4  --  3.9  --   CL 82* 86*  --  93*  --   CO2 19* 22  --  21*  --   GLUCOSE 103* 136*  --  85  --   BUN 36* 35*  --  31*  --   CREATININE 1.99* 2.30*  --  2.00*  --   CALCIUM 10.0 9.3  --  9.0  --     GFR: Estimated Creatinine Clearance: 16.1 mL/min (A) (by C-G formula based on SCr of 2 mg/dL (H)).  Liver Function Tests: Recent Labs  Lab 12/20/21 1339  AST 34  ALT 14  ALKPHOS 72  BILITOT 0.6  PROT 7.0  ALBUMIN 3.9    CBG: No results for input(s): "GLUCAP" in the last 168 hours.   Recent Results (from the past 240 hour(s))  Urine Culture     Status: Abnormal   Collection Time: 12/18/21 12:05 PM   Specimen: Blood   UR  Result Value Ref Range Status   Urine Culture, Routine Final report (A)  Final   Organism ID, Bacteria Comment (A)  Final    Comment: Pseudomonas aeruginosa 50,000-100,000 colony forming units per mL    Antimicrobial Susceptibility Comment  Final    Comment:       ** S = Susceptible; I = Intermediate; R = Resistant **                    P = Positive; N = Negative             MICS are expressed in micrograms per mL    Antibiotic                 RSLT#1    RSLT#2    RSLT#3    RSLT#4 Amikacin                       S Cefepime                       S Ceftazidime                    S Ciprofloxacin                  S Gentamicin                     S Imipenem                       S Levofloxacin  S Meropenem                      S Piperacillin                   S Ticarcillin                    S Tobramycin                     S          Radiology Studies: DG Chest 2 View  Result Date:  12/20/2021 CLINICAL DATA:  [phone] with hyponatremia.  Generalized weakness. EXAM: CHEST - 2 VIEW COMPARISON:  AP Lat first from series 08/03/2019 FINDINGS: There is mild cardiomegaly without findings of acute CHF. LAD coronary artery stent incidentally seen. The aorta is tortuous with patchy calcifications. Otherwise unremarkable mediastinum. There is a low inspiration on exam. Laterally in the left lower lung field there may be a developing hazy infiltrate but is not localized on the lateral view, would most likely be in the left lower lobe if present but it is difficult to assess this area due to an overlying telemetry lead. Remaining hypoinflated lungs are generally clear. The sulci are sharp. Diffuse osteopenia. There is again noted mild thoracic kyphodextroscoliosis and a moderate to severe T9 compression fracture which appears to have worsened since the previous exam. Other vertebra are normal in heights, as visualized. IMPRESSION: 1. Questionable developing hazy infiltrate lateral left lower lung field, the area not optimally seen due to an overlying telemetry lead. 2. Low lung volumes with no other visible acute cardiopulmonary findings, but would recommend standing PA and lateral study in full inspiration to see if this persists. 3. Moderate to severe T9 compression fracture appears to have worsened since the prior study in 2021, but acuity indeterminate. Osteopenia. 4. Aortic atherosclerosis and uncoiling.  Mild cardiomegaly. Electronically Signed   By: Telford Nab M.D.   On: 12/20/2021 20:44   US RENAL  Result Date: 12/20/2021 CLINICAL DATA:  Acute kidney injury EXAM: RENAL / URINARY TRACT ULTRASOUND COMPLETE COMPARISON:  None Available. FINDINGS: Right Kidney: Renal measurements: 8.4 x 4.2 x 4.8 cm = volume: 87 mL. Echogenic renal parenchyma. No mass or hydronephrosis. Left Kidney: Renal measurements: 8.5 x 4.3 x 4.4 cm = volume: 85 mL. Echogenicity within normal limits. No mass or hydronephrosis  visualized. Bladder: Appears normal for degree of bladder distention. Other: Trace perihepatic ascites. IMPRESSION: Small bilateral kidneys. No hydronephrosis. Trace perihepatic ascites. Electronically Signed   By: Julian Hy M.D.   On: 12/20/2021 20:26        Scheduled Meds:  sodium chloride   Intravenous Once   allopurinol  100 mg Oral Daily   amLODipine  5 mg Oral Daily   aspirin EC  81 mg Oral Daily   atorvastatin  40 mg Oral Daily   carvedilol  12.5 mg Oral BID WC   cephALEXin  500 mg Oral BID   cyanocobalamin  1,000 mcg Intramuscular Daily   Followed by   Derrill Memo ON 12/28/2021] cyanocobalamin  1,000 mcg Intramuscular Weekly   feeding supplement  237 mL Oral TID BM   fesoterodine  4 mg Oral Daily   hydrALAZINE  100 mg Oral TID   isosorbide mononitrate  120 mg Oral Daily   multivitamin  1 tablet Oral QHS   pantoprazole  40 mg Oral Daily   polyethylene glycol  17 g Oral Daily  Continuous Infusions:  sodium chloride 50 mL/hr at 12/21/21 0834     LOS: 1 day      Phillips Climes, MD [facility]   To contact the attending provider between 7A-7P or the covering provider during after hours 7P-7A, please log into the web site [email or web address] and access using universal [place] password for that web site. If you do not have the password, please call the hospital operator.  12/21/2021, 3:24 PM

## 2021-12-21 NOTE — Progress Notes (Signed)
Patient has arrived on unit, V/S taken, CHG given, instructed on call bell, bed alarm on and safety ensured.

## 2021-12-21 NOTE — Plan of Care (Signed)

## 2021-12-22 DIAGNOSIS — I10 Essential (primary) hypertension: Secondary | ICD-10-CM

## 2021-12-22 LAB — TYPE AND SCREEN
ABO/RH(D): O POS
Antibody Screen: NEGATIVE
Unit division: 0

## 2021-12-22 LAB — BASIC METABOLIC PANEL
Anion gap: 7 (ref 5–15)
BUN: 23 mg/dL (ref 8–23)
CO2: 21 mmol/L — ABNORMAL LOW (ref 22–32)
Calcium: 8.5 mg/dL — ABNORMAL LOW (ref 8.9–10.3)
Chloride: 101 mmol/L (ref 98–111)
Creatinine, Ser: 1.78 mg/dL — ABNORMAL HIGH (ref 0.44–1.00)
GFR, Estimated: 28 mL/min — ABNORMAL LOW (ref >60)
Glucose, Bld: 118 mg/dL — ABNORMAL HIGH (ref 70–99)
Potassium: 3.6 mmol/L (ref 3.5–5.1)
Sodium: 129 mmol/L — ABNORMAL LOW (ref 135–145)

## 2021-12-22 LAB — BPAM RBC
Blood Product Expiration Date: 202311032359
ISSUE DATE / TIME: 202310071537
Unit Type and Rh: 5100

## 2021-12-22 LAB — CBC
HCT: 25 % — ABNORMAL LOW (ref 36.0–46.0)
Hemoglobin: 8.6 g/dL — ABNORMAL LOW (ref 12.0–15.0)
MCH: 31.3 pg (ref 26.0–34.0)
MCHC: 34.4 g/dL (ref 30.0–36.0)
MCV: 90.9 fL (ref 80.0–100.0)
Platelets: 125 10*3/uL — ABNORMAL LOW (ref 150–400)
RBC: 2.75 MIL/uL — ABNORMAL LOW (ref 3.87–5.11)
RDW: 12.8 % (ref 11.5–15.5)
WBC: 4.1 10*3/uL (ref 4.0–10.5)
nRBC: 0 % (ref 0.0–0.2)

## 2021-12-22 MED ORDER — HYDRALAZINE HCL 100 MG PO TABS: 100.0000 mg | ORAL_TABLET | Freq: Two times a day (BID) | ORAL | 6 refills | Status: DC

## 2021-12-22 MED ORDER — VITAMIN B-12 1000 MCG PO TABS: 1000.0000 ug | ORAL_TABLET | Freq: Every day | ORAL | 2 refills | Status: DC

## 2021-12-22 NOTE — Progress Notes (Signed)
Patient refused blood draw for labs. Notified MD.

## 2021-12-22 NOTE — Progress Notes (Signed)
RN tried to communicate with patient regarding her lab draws using Mandarin interpreter [phone]  Patient not communicating with interpreter. RN called her son several times but call not received at the moment.

## 2021-12-22 NOTE — TOC Transition Note (Addendum)
Transition of Care [facility]) - CM/SW Discharge Note   Patient Details  Name: Danielle Houston MRN: [id number] Date of Birth: 10/04/1940  [facility]) CM/SW Contact:  Bartholomew Crews, RN Phone Number: [phone] 12/22/2021, 2:53 PM   Clinical Narrative:     Spoke with patient and son at the bedside to discuss post acute transition. Initiated call to [employer] for [employer] interpreter, but son preferred to use his daughter, Vaughan Basta. Discussed recommendations for [facility] PT. HH is preferred, but agreeable to outpatient PT if unable to arrange [facility] PT. Discussed barriers for [facility] to include limited staffing and in network providers. Choice of outpatient PT facility is [facility] - referral sent. Also encouraged to f/u with PCP for f/u referrals.   [facility] unable to accept d/t staffing  [facility] unable to accept d/t oon  [facility] unable to accept d/t oon  [facility]  Unable to accept  [facility]  Unable to accept d/t OON  [facility] pending discovery of network/staffing status  [facility]  Unable to accept d/t OON  Family to provide transportation home. Patient does have RW, but doesn't use d/t unable to figure out how. No further TOC needs identified at this time.   Final next level of care: Home w Home Health Services Barriers to Discharge: No Barriers Identified   Patient Goals and CMS Choice Patient states their goals for this hospitalization and ongoing recovery are:: return home with son CMS Medicare.gov Compare Post Acute Care list provided to:: Patient Choice offered to / list presented to : Adult Children  Discharge Placement                       Discharge Plan and Services                DME Arranged: N/A DME Agency: NA       HH Arranged: PT          Social Determinants of Health (SDOH) Interventions     Readmission Risk Interventions     No data to display

## 2021-12-22 NOTE — Discharge Instructions (Signed)
Follow with Primary MD Charlott Rakes, MD in 7 days   Get CBC, CMP, checked  by Primary MD next visit.    Activity: As tolerated with Full fall precautions use walker/cane & assistance as needed   Disposition Home    Diet:Regular Diet   On your next visit with your primary care physician please Get Medicines reviewed and adjusted.   Please request your Prim.MD to go over all Hospital Tests and Procedure/Radiological results at the follow up, please get all Hospital records sent to your Prim MD by signing hospital release before you go home.   If you experience worsening of your admission symptoms, develop shortness of breath, life threatening emergency, suicidal or homicidal thoughts you must seek medical attention immediately by calling 911 or calling your MD immediately  if symptoms less severe.  You Must read complete instructions/literature along with all the possible adverse reactions/side effects for all the Medicines you take and that have been prescribed to you. Take any new Medicines after you have completely understood and accpet all the possible adverse reactions/side effects.   Do not drive, operating heavy machinery, perform activities at heights, swimming or participation in water activities or provide baby sitting services if your were admitted for syncope or siezures until you have seen by Primary MD or a Neurologist and advised to do so again.  Do not drive when taking Pain medications.    Do not take more than prescribed Pain, Sleep and Anxiety Medications  Special Instructions: If you have smoked or chewed Tobacco  in the last 2 yrs please stop smoking, stop any regular Alcohol  and or any Recreational drug use.  Wear Seat belts while driving.   Please note  You were cared for by a hospitalist during your hospital stay. If you have any questions about your discharge medications or the care you received while you were in the hospital after you are discharged,  you can call the unit and asked to speak with the hospitalist on call if the hospitalist that took care of you is not available. Once you are discharged, your primary care physician will handle any further medical issues. Please note that NO REFILLS for any discharge medications will be authorized once you are discharged, as it is imperative that you return to your primary care physician (or establish a relationship with a primary care physician if you do not have one) for your aftercare needs so that they can reassess your need for medications and monitor your lab values.

## 2021-12-22 NOTE — Evaluation (Signed)
Clinical/Bedside Swallow Evaluation Patient Details  Name: Danielle Houston MRN: [id number] Date of Birth: Jul 23, 1940  Today's Date: 12/22/2021 Time: SLP Start Time (ACUTE ONLY): 1115 SLP Stop Time (ACUTE ONLY): 1140 SLP Time Calculation (min) (ACUTE ONLY): 25 min  Past Medical History:  Past Medical History:  Diagnosis Date   Coronary artery disease    Hypertension    Hypothyroidism    Past Surgical History:  Past Surgical History:  Procedure Laterality Date   CARDIAC CATHETERIZATION     CORONARY ANGIOPLASTY     HPI:  Pt is an 81 y.o. female who presented with generalized weakness. Dx hyponatremia, acute on chronic anemia, severe protein calorie malnutrition. Per EMR, pt's family has reported that the pt has a good appetite but continues to have weight loss (~40 pounds since last year). Per RD's note 10/7, difficulty swallowing meds was reported and pt typically eats "soft/mushy" food. PMH: CAD, HTN, CKD stage III b, gout, peripheral neuropathy.    Assessment / Plan / Recommendation  Clinical Impression  Pt was seen for bedside swallow evaluation with her son present. [employer] Mandarin interpreter, May (ID# [phone]), was used for translation. However pt speaks, [place], a dialect of Mandarin, so SLP's English was then translated to [employer] by [employer] interpreter and pt's son then translated the Mandarin to [place]. Pt's son denied the pt having any difficulty swallowing, but stated that she prefers softer foods since her dentures are worn. Oral mechanism exam was [facility] and she presented with full dentures. Pt son stated that the pt has not had a good appetite recently and therefore has not been eating. Pt was resistant to accepting solids; she ultimately did so with encouragement, but trials were limited with regular texture solids. Pt demonstrated piecemeal deglutition with thin liquids, but this or other oral phase symptoms were not observed with other consistencies and no s/s of  aspiration were demonstrated. Pt's diet options were discussed with pt's son; considering pt's currently poor appetite, it was agreed that her current diet (regular textures solids with thin liquids) will be continued to maximize the pt's options. SLP will follow pt briefly to ensure tolerance since intake was limited during the evaluation. SLP Visit Diagnosis: Dysphagia, unspecified (R13.10)    Aspiration Risk  No limitations    Diet Recommendation Regular;Thin liquid   Liquid Administration via: Cup;Straw Medication Administration: Whole meds with liquid Supervision: Patient able to self feed Compensations: Slow rate Postural Changes: Seated upright at 90 degrees    Other  Recommendations Oral Care Recommendations: Oral care BID    Recommendations for follow up therapy are one component of a multi-disciplinary discharge planning process, led by the attending physician.  Recommendations may be updated based on patient status, additional functional criteria and insurance authorization.  Follow up Recommendations No SLP follow up      Assistance Recommended at Discharge    Functional Status Assessment Patient has not had a recent decline in their functional status  Frequency and Duration min 1 x/week  1 week       Prognosis        Swallow Study   General Date of Onset: 12/21/21 HPI: Pt is an 81 y.o. female who presented with generalized weakness. Dx hyponatremia, acute on chronic anemia, severe protein calorie malnutrition. Per EMR, pt's family has reported that the pt has a good appetite but continues to have weight loss (~40 pounds since last year). Per RD's note 10/7, difficulty swallowing meds was reported and pt typically eats "soft/mushy"  food. PMH: CAD, HTN, CKD stage III b, gout, peripheral neuropathy. Type of Study: Bedside Swallow Evaluation Previous Swallow Assessment: none Diet Prior to this Study: Regular;Thin liquids Temperature Spikes Noted: No Respiratory  Status: Room air History of Recent Intubation: No Behavior/Cognition: Alert;Cooperative;Pleasant mood Oral Cavity Assessment: Within Functional Limits Oral Care Completed by SLP: No Oral Cavity - Dentition: Dentures, top;Dentures, bottom Vision: Functional for self-feeding Self-Feeding Abilities: Needs assist Patient Positioning: Upright in bed;Postural control adequate for testing Baseline Vocal Quality: Normal    Oral/Motor/Sensory Function Overall Oral Motor/Sensory Function: Within functional limits   Ice Chips     Thin Liquid Thin Liquid: Within functional limits Presentation: Straw    Nectar Thick Nectar Thick Liquid: Not tested   Honey Thick Honey Thick Liquid: Not tested   Puree Puree: Within functional limits Presentation: Spoon   Solid     Solid: Within functional limits Presentation: [place] [address], [place], [place] Office number [phone]  Horton Marshall 12/22/2021,12:07 PM

## 2021-12-22 NOTE — Plan of Care (Signed)
  Problem: Education: Goal: Knowledge of General Education information will improve Description: Including pain rating scale, medication(s)/side effects and non-pharmacologic comfort measures 12/22/2021 1235 by Albina Billet, RN Outcome: Progressing 12/22/2021 1235 by Albina Billet, RN Outcome: Progressing   Problem: Health Behavior/Discharge Planning: Goal: Ability to manage health-related needs will improve 12/22/2021 1235 by Albina Billet, RN Outcome: Progressing 12/22/2021 1235 by Albina Billet, RN Outcome: Progressing   Problem: Clinical Measurements: Goal: Ability to maintain clinical measurements within normal limits will improve 12/22/2021 1235 by Albina Billet, RN Outcome: Progressing 12/22/2021 1235 by Albina Billet, RN Outcome: Progressing

## 2021-12-22 NOTE — Discharge Summary (Signed)
Physician Discharge Summary  Danielle Houston UXN:[id number] DOB: 01-13-1941 DOA: 12/20/2021  PCP: Charlott Rakes, MD  Admit date: 12/20/2021 Discharge date: 12/22/2021  Admitted FromHome\ Disposition: Home  Recommendations for Outpatient Follow-up:  Follow up with PCP in 1-2 weeks Please obtain BMP/CBC in one week Please consider referral to renal for Procrit actions  Home Health:YES   Discharge Condition:Stable CODE STATUS:FULL Diet recommendation: Heart Healthy  Brief/Interim Summary:  Danielle Houston is a 81 y.o. female with medical history significant of CAD, HTN, CKD stage III b, gout, peripheral neuropathy, presented with generalized weakness worsening.   Symptoms started Monday-Tuesday, patient had a diarrhea on Monday then started to feel generalized weakness.  Denies any abdominal pain, no nauseous vomiting.  Patient drinks about 1.0 to 1.5 L a day, family reported patient has good appetite but continues to have weight loss of about 40 pounds since last year.  Family also reported that patient has had alternative diarrhea and constipation since last year.  And this year has been predominantly constipation, with little effect to stool softener.  Once today, patient also started to have dysuria, and patient went to see her PCP, who diagnosed patient with UTI and prescribed Keflex but patient has not started to take it yet.  Patient still has some dysuria at this point, no back pain no fever chills.  On that office visit, blood work was done sodium 120, TSH 6.0, normal T4, slight decrease of T3. -Was admitted where she was kept on IV fluid with significant improvement of her sodium, it is 129 of discharge, as well she had significant anemia worsening due to dilution, this has significantly improved after receiving 1 unit PRBC, please see discussion below.    Symptomatic hyponatremia -Acute-subacute due to volume depletion and poor oral/solute intake, this has significantly improved with IV  fluids, it was 120 on admission, it is 129 on discharge    HTN, uncontrolled -Pressure significantly elevated, this is most likely as she was not taking her meds, amlodipine was added on admission, but actually blood pressure has been soft at time of discharge so amlodipine has been discontinued, and hydralazine was decreased to twice daily instead of 3 times daily.    Acute on chronic anemia Anemia of chronic kidney disease B12 deficiency -Denies any abdominal pain, denies any blood in the stool or dark-colored stool -Hemoccult is negative -Anemia panel significant for good iron and iron stores, but low B12 stores, so she was started on be 12 supplements . -Will anemia of chronic kidney disease she would benefit from Procrit as an outpatient, please consider referral to renal as an outpatient regarding that -Hemoglobin was 6.8, she received 1 unit PRBC with significant improvement, hemoglobin is 8.6 at time of discharge   CKD stage IV -Function at baseline at time of discharge   Severe protein calorie malnutrition -BMI= 18 -Started on Nepro     Discharge Diagnoses:  Principal Problem:   Hyponatremia Active Problems:   CAD (coronary artery disease)   Anemia   Hypothyroidism   Essential hypertension    Discharge Instructions  Discharge Instructions     Diet - low sodium heart healthy   Complete by: As directed    Discharge instructions   Complete by: As directed    Follow with Primary MD Charlott Rakes, MD in 7 days   Get CBC, CMP,  checked  by Primary MD next visit.    Activity: As tolerated with Full fall precautions use walker/cane & assistance as needed  Disposition Home    Diet: Heart Healthy   On your next visit with your primary care physician please Get Medicines reviewed and adjusted.   Please request your Prim.MD to go over all Hospital Tests and Procedure/Radiological results at the follow up, please get all Hospital records sent to your Prim MD  by signing hospital release before you go home.   If you experience worsening of your admission symptoms, develop shortness of breath, life threatening emergency, suicidal or homicidal thoughts you must seek medical attention immediately by calling 911 or calling your MD immediately  if symptoms less severe.  You Must read complete instructions/literature along with all the possible adverse reactions/side effects for all the Medicines you take and that have been prescribed to you. Take any new Medicines after you have completely understood and accpet all the possible adverse reactions/side effects.   Do not drive, operating heavy machinery, perform activities at heights, swimming or participation in water activities or provide baby sitting services if your were admitted for syncope or siezures until you have seen by Primary MD or a Neurologist and advised to do so again.  Do not drive when taking Pain medications.    Do not take more than prescribed Pain, Sleep and Anxiety Medications  Special Instructions: If you have smoked or chewed Tobacco  in the last 2 yrs please stop smoking, stop any regular Alcohol  and or any Recreational drug use.  Wear Seat belts while driving.   Please note  You were cared for by a hospitalist during your hospital stay. If you have any questions about your discharge medications or the care you received while you were in the hospital after you are discharged, you can call the unit and asked to speak with the hospitalist on call if the hospitalist that took care of you is not available. Once you are discharged, your primary care physician will handle any further medical issues. Please note that NO REFILLS for any discharge medications will be authorized once you are discharged, as it is imperative that you return to your primary care physician (or establish a relationship with a primary care physician if you do not have one) for your aftercare needs so that they can  reassess your need for medications and monitor your lab values.   Increase activity slowly   Complete by: As directed       Allergies as of 12/22/2021   No Known Allergies      Medication List     TAKE these medications    allopurinol 100 MG tablet Commonly known as: ZYLOPRIM Take 1 tablet (100 mg total) by mouth daily.   aspirin EC 81 MG tablet Take 1 tablet (81 mg total) by mouth daily. Swallow whole.   atorvastatin 40 MG tablet Commonly known as: LIPITOR TAKE 1 TABLET (40 MG TOTAL) BY MOUTH DAILY. What changed: how much to take   carvedilol 12.5 MG tablet Commonly known as: COREG TAKE 1 TABLET (12.5 MG TOTAL) BY MOUTH 2 (TWO) TIMES DAILY WITH A MEAL.   cephALEXin 500 MG capsule Commonly known as: KEFLEX Take 1 capsule (500 mg total) by mouth 2 (two) times daily.   cyanocobalamin 1000 MCG tablet Commonly known as: VITAMIN B12 Take 1 tablet (1,000 mcg total) by mouth daily.   diclofenac Sodium 1 % Gel Commonly known as: VOLTAREN APPLY 2 Grams TOPICALLY 4 (FOUR) TIMES DAILY as needed for PAIN What changed:  how much to take how to take this when to take  this reasons to take this   hydrALAZINE 100 MG tablet Commonly known as: APRESOLINE Take 1 tablet (100 mg total) by mouth 2 (two) times daily. What changed: when to take this   isosorbide mononitrate 120 MG 24 hr tablet Commonly known as: IMDUR Take 1 tablet (120 mg total) by mouth daily.   lidocaine 5 % Commonly known as: Lidoderm Place 1 patch onto the skin daily. Remove & Discard patch within 12 hours or as directed by MD What changed:  when to take this reasons to take this   omeprazole 20 MG capsule Commonly known as: PRILOSEC TAKE 1 CAPSULE (20 MG TOTAL) BY MOUTH DAILY. What changed: how much to take   polyethylene glycol powder 17 GM/SCOOP powder Commonly known as: GLYCOLAX/MIRALAX Take 17 g by mouth daily.   tolterodine 2 MG 24 hr capsule Commonly known as: Detrol LA Take 1 capsule (2  mg total) by mouth daily.        Follow-up Information     Charlott Rakes, MD Follow up.   Specialty: Family Medicine Contact information: [place] [address] [phone]                No Known Allergies  Consultations: none   Procedures/Studies: DG Chest 2 View  Result Date: 12/20/2021 CLINICAL DATA:  [phone] with hyponatremia.  Generalized weakness. EXAM: CHEST - 2 VIEW COMPARISON:  AP Lat first from series 08/03/2019 FINDINGS: There is mild cardiomegaly without findings of acute CHF. LAD coronary artery stent incidentally seen. The aorta is tortuous with patchy calcifications. Otherwise unremarkable mediastinum. There is a low inspiration on exam. Laterally in the left lower lung field there may be a developing hazy infiltrate but is not localized on the lateral view, would most likely be in the left lower lobe if present but it is difficult to assess this area due to an overlying telemetry lead. Remaining hypoinflated lungs are generally clear. The sulci are sharp. Diffuse osteopenia. There is again noted mild thoracic kyphodextroscoliosis and a moderate to severe T9 compression fracture which appears to have worsened since the previous exam. Other vertebra are normal in heights, as visualized. IMPRESSION: 1. Questionable developing hazy infiltrate lateral left lower lung field, the area not optimally seen due to an overlying telemetry lead. 2. Low lung volumes with no other visible acute cardiopulmonary findings, but would recommend standing PA and lateral study in full inspiration to see if this persists. 3. Moderate to severe T9 compression fracture appears to have worsened since the prior study in 2021, but acuity indeterminate. Osteopenia. 4. Aortic atherosclerosis and uncoiling.  Mild cardiomegaly. Electronically Signed   By: Telford Nab M.D.   On: 12/20/2021 20:44   US RENAL  Result Date: 12/20/2021 CLINICAL DATA:  Acute kidney injury  EXAM: RENAL / URINARY TRACT ULTRASOUND COMPLETE COMPARISON:  None Available. FINDINGS: Right Kidney: Renal measurements: 8.4 x 4.2 x 4.8 cm = volume: 87 mL. Echogenic renal parenchyma. No mass or hydronephrosis. Left Kidney: Renal measurements: 8.5 x 4.3 x 4.4 cm = volume: 85 mL. Echogenicity within normal limits. No mass or hydronephrosis visualized. Bladder: Appears normal for degree of bladder distention. Other: Trace perihepatic ascites. IMPRESSION: Small bilateral kidneys. No hydronephrosis. Trace perihepatic ascites. Electronically Signed   By: Julian Hy M.D.   On: 12/20/2021 20:26     Subjective:  No significant events overnight as discussed with staff Discharge Exam: Vitals:   12/22/21 0954 12/22/21 1200  BP: (!) 131/48 (!) 101/47  Pulse: 69 73  Resp:  16  Temp:  98 F (36.7 C)  SpO2:  95%   Vitals:   12/22/21 0359 12/22/21 0823 12/22/21 0954 12/22/21 1200  BP: 113/68 (!) 122/56 (!) 131/48 (!) 101/47  Pulse: 93 74 69 73  Resp: 20 20  16   Temp: 98.4 F (36.9 C) 98.1 F (36.7 C)  98 F (36.7 C)  TempSrc: Oral Oral  Oral  SpO2: 94% 98%  95%  Weight:      Height:        General: Pt is alert, awake, not in acute distress Cardiovascular: RRR, S1/S2 +, no rubs, no gallops Respiratory: CTA bilaterally, no wheezing, no rhonchi Abdominal: Soft, NT, ND, bowel sounds + Extremities: no edema, no cyanosis    The results of significant diagnostics from this hospitalization (including imaging, microbiology, ancillary and laboratory) are listed below for reference.     Microbiology: Recent Results (from the past 240 hour(s))  Urine Culture     Status: Abnormal   Collection Time: 12/18/21 12:05 PM   Specimen: Blood   UR  Result Value Ref Range Status   Urine Culture, Routine Final report (A)  Final   Organism ID, Bacteria Comment (A)  Final    Comment: Pseudomonas aeruginosa 50,000-100,000 colony forming units per mL    Antimicrobial Susceptibility Comment  Final     Comment:       ** S = Susceptible; I = Intermediate; R = Resistant **                    P = Positive; N = Negative             MICS are expressed in micrograms per mL    Antibiotic                 RSLT#1    RSLT#2    RSLT#3    RSLT#4 Amikacin                       S Cefepime                       S Ceftazidime                    S Ciprofloxacin                  S Gentamicin                     S Imipenem                       S Levofloxacin                   S Meropenem                      S Piperacillin                   S Ticarcillin                    S Tobramycin                     S      Labs: BNP (last 3 results) No results for input(s): "BNP" in the last 8760 hours. Basic Metabolic Panel: Recent Labs  Lab 12/18/21 1207  12/20/21 1339 12/20/21 2343 12/21/21 0317 12/21/21 0811 12/22/21 1233  NA 120* 122* 124* 125* 125* 129*  K 5.0 4.4  --  3.9  --  3.6  CL 82* 86*  --  93*  --  101  CO2 19* 22  --  21*  --  21*  GLUCOSE 103* 136*  --  85  --  118*  BUN 36* 35*  --  31*  --  23  CREATININE 1.99* 2.30*  --  2.00*  --  1.78*  CALCIUM 10.0 9.3  --  9.0  --  8.5*   Liver Function Tests: Recent Labs  Lab 12/20/21 1339  AST 34  ALT 14  ALKPHOS 72  BILITOT 0.6  PROT 7.0  ALBUMIN 3.9   No results for input(s): "LIPASE", "AMYLASE" in the last 168 hours. No results for input(s): "AMMONIA" in the last 168 hours. CBC: Recent Labs  Lab 12/18/21 1207 12/20/21 1339 12/21/21 0317 12/21/21 0811 12/22/21 1233  WBC 7.9 4.8 5.6  --  4.1  NEUTROABS 6.4 3.2  --   --   --   HGB 8.7* 7.6* 6.8* 7.0* 8.6*  HCT 24.2* 22.3* 19.5* 19.5* 25.0*  MCV 91 93.3 90.7  --  90.9  PLT 169 151 148*  --  125*   Cardiac Enzymes: No results for input(s): "CKTOTAL", "CKMB", "CKMBINDEX", "TROPONINI" in the last 168 hours. BNP: Invalid input(s): "POCBNP" CBG: No results for input(s): "GLUCAP" in the last 168 hours. D-Dimer No results for input(s): "DDIMER" in the last 72  hours. Hgb A1c No results for input(s): "HGBA1C" in the last 72 hours. Lipid Profile No results for input(s): "CHOL", "HDL", "LDLCALC", "TRIG", "CHOLHDL", "LDLDIRECT" in the last 72 hours. Thyroid function studies No results for input(s): "TSH", "T4TOTAL", "T3FREE", "THYROIDAB" in the last 72 hours.  Invalid input(s): "FREET3" Anemia work up Recent Labs    12/20/21 1800 12/20/21 2343  FERRITIN 249  --   TIBC 238*  --   IRON 75  --   RETICCTPCT  --  1.3   Urinalysis    Component Value Date/Time   BILIRUBINUR negative 12/18/2021 1148   BILIRUBINUR Negative 05/14/2021 1534   KETONESUR negative 12/18/2021 1148   PROTEINUR Negative 05/14/2021 1534   UROBILINOGEN 0.2 12/18/2021 1148   NITRITE Negative 12/18/2021 1148   NITRITE Negative 05/14/2021 1534   LEUKOCYTESUR Small (1+) (A) 12/18/2021 1148   Sepsis Labs Recent Labs  Lab 12/18/21 1207 12/20/21 1339 12/21/21 0317 12/22/21 1233  WBC 7.9 4.8 5.6 4.1   Microbiology Recent Results (from the past 240 hour(s))  Urine Culture     Status: Abnormal   Collection Time: 12/18/21 12:05 PM   Specimen: Blood   UR  Result Value Ref Range Status   Urine Culture, Routine Final report (A)  Final   Organism ID, Bacteria Comment (A)  Final    Comment: Pseudomonas aeruginosa 50,000-100,000 colony forming units per mL    Antimicrobial Susceptibility Comment  Final    Comment:       ** S = Susceptible; I = Intermediate; R = Resistant **                    P = Positive; N = Negative             MICS are expressed in micrograms per mL    Antibiotic                 RSLT#1  RSLT#2    RSLT#3    RSLT#4 Amikacin                       S Cefepime                       S Ceftazidime                    S Ciprofloxacin                  S Gentamicin                     S Imipenem                       S Levofloxacin                   S Meropenem                      S Piperacillin                   S Ticarcillin                     S Tobramycin                     S      Time coordinating discharge: Over 30 minutes  SIGNED:   Phillips Climes, MD  [facility] 12/22/2021, 2:48 PM Pager   If 7PM-7AM, please contact night-coverage [email or web address]

## 2021-12-22 NOTE — Plan of Care (Signed)
discharged

## 2021-12-24 ENCOUNTER — Telehealth: Payer: Self-pay

## 2021-12-24 NOTE — Telephone Encounter (Signed)
PCS referral faxed to [facility]

## 2021-12-24 NOTE — Telephone Encounter (Signed)
Transition Care Management Follow-up Telephone Call  Call placed with assistance of Mandarin interpreter [id number]/[employer]. Call completed with patient's son, Trisha Mangle Date of [facility] and [facility]  How have you been since you were released from the hospital? He said she is feeling better. Any questions or concerns? Yes- regarding medications noted below.  He is requsting a referral to nephrology.   Items Reviewed: Did the pt receive and understand the discharge instructions provided? Yes  Medications obtained and verified?  Her son was having difficulty reconciling the medications with the language barrier. He confirmed that she has the  hydralazine but was not aware of the change in orders. We reviewed that order multiple times and he was able to locate that information on the AVS and correctly state the change.  He then said she does not have carvedilol and she only has 6 medications and he was not sure which ones. He said he has someone who speaks English to assist him with contacting the pharmacy and requesting refills. I suggested that he take her AVS to the pharmacy, [place], and ask them to review and refill what is needed.  They can contact our clinic if there are questions about any orders.  I stressed the importance of reconciling these medications  he said he understood and would follow up with the pharmacy.   Other? No  Any new allergies since your discharge? No  Dietary orders reviewed? No Do you have support at home? Yes - she lives with her son, [facility] and Equipment/Supplies: Were home health services ordered? no If so, what is the name of the agency? N/a  Has the agency set up a time to come to the patient's home? not applicable Were any new equipment or medical supplies ordered?  No What is the name of the medical supply agency? N/a Were you able to get the supplies/equipment? not applicable Do you have any  questions related to the use of the equipment or supplies? No  Functional Questionnaire: (I = Independent and D = Dependent) ADLs: her son assists with ambulation and personal care/ADLs as needed  She has been referred to outpatient therapy at [facility]. PCS referral has been placed with [facility]   Follow up appointments reviewed:  PCP Hospital f/u appt confirmed? Yes  Scheduled to see Dr Margarita Rana - 12/30/2021.  [facility] f/u appt confirmed? Yes  Scheduled to see urogynecology- 01/07/2022.  Are transportation arrangements needed? No  If their condition worsens, is the pt aware to call PCP or go to the Emergency Dept.? Yes Was the patient provided with contact information for the PCP's office or ED? Yes Was to pt encouraged to call back with questions or concerns? Yes

## 2021-12-24 NOTE — Telephone Encounter (Signed)
From the discharge call:  Call placed with assistance of Mandarin interpreter [id number]/[employer].   Call completed with patient's son, Danielle Houston, who said she is feeling better.   He is requsting a referral to nephrology.     Her son was having difficulty reconciling the medications with the language barrier. He confirmed that she has the  hydralazine but was not aware of the change in orders. We reviewed that order multiple times and he was able to locate that information on the AVS and correctly state the change.  He then said she does not have carvedilol and she only has 6 medications and he was not sure which ones. He said he has someone who speaks English to assist him with contacting the pharmacy and requesting refills. I suggested that he take her AVS to the pharmacy, [place], and ask them to review and refill what is needed.  They can contact our clinic if there are questions about any orders.  I stressed the importance of reconciling these medications  he said he understood and would follow up with the pharmacy.    Scheduled to see Dr Margarita Rana - 12/30/2021.

## 2021-12-25 ENCOUNTER — Other Ambulatory Visit: Payer: Self-pay | Admitting: Pharmacist

## 2021-12-25 ENCOUNTER — Other Ambulatory Visit: Payer: Self-pay

## 2021-12-25 DIAGNOSIS — M542 Cervicalgia: Secondary | ICD-10-CM

## 2021-12-25 DIAGNOSIS — N3281 Overactive bladder: Secondary | ICD-10-CM

## 2021-12-25 DIAGNOSIS — K5909 Other constipation: Secondary | ICD-10-CM

## 2021-12-25 MED ORDER — DICLOFENAC SODIUM 1 % EX GEL
CUTANEOUS | 0 refills | Status: DC
  Filled 2022-03-20: qty 100, 13d supply, fill #0

## 2021-12-25 MED ORDER — TOLTERODINE TARTRATE ER 2 MG PO CP24
2.0000 mg | ORAL_CAPSULE | Freq: Every day | ORAL | 2 refills | Status: DC
  Filled 2021-12-25: qty 30, 30d supply, fill #0

## 2021-12-25 MED ORDER — LIDOCAINE 5 % EX PTCH
1.0000 | MEDICATED_PATCH | CUTANEOUS | 1 refills | Status: DC
  Filled 2021-12-25 (×2): qty 30, 30d supply, fill #0
  Filled 2022-01-23: qty 30, 30d supply, fill #1

## 2021-12-25 MED ORDER — POLYETHYLENE GLYCOL 3350 17 GM/SCOOP PO POWD
17.0000 g | Freq: Every day | ORAL | 1 refills | Status: DC
  Filled 2021-12-25: qty 510, 30d supply, fill #0

## 2021-12-26 ENCOUNTER — Other Ambulatory Visit: Payer: Self-pay

## 2021-12-30 ENCOUNTER — Encounter: Payer: Self-pay | Admitting: Family Medicine

## 2021-12-30 ENCOUNTER — Ambulatory Visit: Payer: Medicaid Other | Admitting: Physical Therapy

## 2021-12-30 ENCOUNTER — Other Ambulatory Visit: Payer: Self-pay

## 2021-12-30 ENCOUNTER — Ambulatory Visit: Payer: Medicaid Other | Attending: Family Medicine | Admitting: Family Medicine

## 2021-12-30 VITALS — BP 127/57 | HR 72 | Wt 107.6 lb

## 2021-12-30 DIAGNOSIS — D638 Anemia in other chronic diseases classified elsewhere: Secondary | ICD-10-CM | POA: Diagnosis not present

## 2021-12-30 DIAGNOSIS — Z09 Encounter for follow-up examination after completed treatment for conditions other than malignant neoplasm: Secondary | ICD-10-CM

## 2021-12-30 DIAGNOSIS — E871 Hypo-osmolality and hyponatremia: Secondary | ICD-10-CM | POA: Diagnosis not present

## 2021-12-30 DIAGNOSIS — I129 Hypertensive chronic kidney disease with stage 1 through stage 4 chronic kidney disease, or unspecified chronic kidney disease: Secondary | ICD-10-CM | POA: Diagnosis not present

## 2021-12-30 DIAGNOSIS — N184 Chronic kidney disease, stage 4 (severe): Secondary | ICD-10-CM

## 2021-12-30 DIAGNOSIS — N814 Uterovaginal prolapse, unspecified: Secondary | ICD-10-CM

## 2021-12-30 NOTE — Progress Notes (Signed)
Subjective:  Patient ID: Danielle Houston, female    DOB: 1940-07-07  Age: 81 y.o. MRN: [id number]  CC: Hospitalization Follow-up and Medication Refill   HPI Danielle Houston is a 81 y.o. year old female with a history of CAD s/p CABG  (in [place]), hypothyroidism, prediabetes (A1c 5.7), essential hypertension, T9 compression fracture after a fall, uterovaginal prolapse who presents today for a transitional care visit. She was hospitalized from 12/20/2021 through 12/22/2021 for hyponatremia of 120 and anemia (presented with a hemoglobin of 7.6 with the lowest value of 6.8).  Hyponatremia was thought to be due to poor oral intake and anemia thought to be dilutional combined with anemia of chronic disease. Received 1 unit PRBC due to admission hemoglobin of 6.8, treated with IV fluids and also placed on vitamin B12 supplements during hospitalization. Discharge sodium was 129 and discharge hemoglobin was 8.6 with recommendations to follow-up with nephrology for treatment of anemia of chronic disease.  Interval History:  She does not have any diarrhea and reports doing okay with no concerns.  She has no lightheadedness or weakness. Endorses adherence with her antihypertensive and her additional medications. Accompanied by her son to today's visit. Past Medical History:  Diagnosis Date   Coronary artery disease    Hypertension    Hypothyroidism     Past Surgical History:  Procedure Laterality Date   CARDIAC CATHETERIZATION     CORONARY ANGIOPLASTY      Family History  Problem Relation Age of Onset   CAD Neg Hx     Social History   Socioeconomic History   Marital status: Single    Spouse name: Not on file   Number of children: Not on file   Years of education: Not on file   Highest education level: Not on file  Occupational History   Not on file  Tobacco Use   Smoking status: Never   Smokeless tobacco: Never  Substance and Sexual Activity   Alcohol use: Never   Drug use: Never   Sexual  activity: Not on file  Other Topics Concern   Not on file  Social History Narrative   Not on file   Social Determinants of Health   Financial Resource Strain: Not on file  Food Insecurity: Not on file  Transportation Needs: Not on file  Physical Activity: Not on file  Stress: Not on file  Social Connections: Not on file    No Known Allergies  Outpatient Medications Prior to Visit  Medication Sig Dispense Refill   allopurinol (ZYLOPRIM) 100 MG tablet Take 1 tablet (100 mg total) by mouth daily. 30 tablet 6   atorvastatin (LIPITOR) 40 MG tablet TAKE 1 TABLET (40 MG TOTAL) BY MOUTH DAILY. 30 tablet 6   carvedilol (COREG) 12.5 MG tablet TAKE 1 TABLET (12.5 MG TOTAL) BY MOUTH 2 (TWO) TIMES DAILY WITH A MEAL. 60 tablet 6   hydrALAZINE (APRESOLINE) 100 MG tablet Take 1 tablet (100 mg total) by mouth 2 (two) times daily. 90 tablet 6   isosorbide mononitrate (IMDUR) 120 MG 24 hr tablet Take 1 tablet (120 mg total) by mouth daily. 30 tablet 3   omeprazole (PRILOSEC) 20 MG capsule TAKE 1 CAPSULE (20 MG TOTAL) BY MOUTH DAILY. (Patient taking differently: Take 20 mg by mouth daily.) 30 capsule 2   aspirin EC 81 MG tablet Take 1 tablet (81 mg total) by mouth daily. Swallow whole. (Patient not taking: Reported on 12/30/2021) 30 tablet 11   cyanocobalamin (VITAMIN B12) 1000 MCG tablet  Take 1 tablet (1,000 mcg total) by mouth daily. (Patient not taking: Reported on 12/30/2021) 30 tablet 2   diclofenac Sodium (VOLTAREN) 1 % GEL APPLY 2 Grams TOPICALLY 4 (FOUR) TIMES DAILY as needed for PAIN (Patient not taking: Reported on 12/30/2021) 100 g 0   lidocaine (LIDODERM) 5 % Place 1 patch onto the skin daily. Remove & Discard patch within 12 hours or as directed by MD (Patient not taking: Reported on 12/30/2021) 30 patch 1   cephALEXin (KEFLEX) 500 MG capsule Take 1 capsule (500 mg total) by mouth 2 (two) times daily. (Patient not taking: Reported on 12/30/2021) 6 capsule 0   polyethylene glycol powder  (GLYCOLAX/MIRALAX) 17 GM/SCOOP powder Take 17 g by mouth daily. (Patient not taking: Reported on 12/30/2021) 3350 g 1   tolterodine (DETROL LA) 2 MG 24 hr capsule Take 1 capsule (2 mg total) by mouth daily. (Patient not taking: Reported on 12/30/2021) 30 capsule 2   No facility-administered medications prior to visit.     ROS Review of Systems  Constitutional:  Negative for activity change and appetite change.  HENT:  Negative for sinus pressure and sore throat.   Respiratory:  Negative for chest tightness, shortness of breath and wheezing.   Cardiovascular:  Negative for chest pain and palpitations.  Gastrointestinal:  Negative for abdominal distention, abdominal pain and constipation.  Genitourinary: Negative.   Musculoskeletal: Negative.   Psychiatric/Behavioral:  Negative for behavioral problems and dysphoric mood.     Objective:  BP (!) 127/57   Pulse 72   Wt 107 lb 9.6 oz (48.8 kg)   LMP  (LMP Unknown)   SpO2 97%   BMI 19.68 kg/m      12/30/2021   10:30 AM 12/22/2021   12:00 PM 12/22/2021    9:54 AM  BP/Weight  Systolic BP 431 540 086  Diastolic BP 57 47 48  Wt. (Lbs) 107.6    BMI 19.68 kg/m2        Physical Exam Constitutional:      Appearance: She is well-developed.  Cardiovascular:     Rate and Rhythm: Normal rate.     Heart sounds: Normal heart sounds. No murmur heard. Pulmonary:     Effort: Pulmonary effort is normal.     Breath sounds: Normal breath sounds. No wheezing or rales.  Chest:     Chest wall: No tenderness.  Abdominal:     General: Bowel sounds are normal. There is no distension.     Palpations: Abdomen is soft. There is no mass.     Tenderness: There is no abdominal tenderness.  Musculoskeletal:        General: Normal range of motion.     Right lower leg: No edema.     Left lower leg: No edema.  Neurological:     Mental Status: She is alert and oriented to person, place, and time.  Psychiatric:        Mood and Affect: Mood normal.         Latest Ref Rng & Units 12/22/2021   12:33 PM 12/21/2021    8:11 AM 12/21/2021    3:17 AM  CMP  Glucose 70 - 99 mg/dL 118   85   BUN 8 - 23 mg/dL 23   31   Creatinine 0.44 - 1.00 mg/dL 1.78   2.00   Sodium 135 - 145 mmol/L 129  125  125   Potassium 3.5 - 5.1 mmol/L 3.6   3.9   Chloride 98 -  111 mmol/L 101   93   CO2 22 - 32 mmol/L 21   21   Calcium 8.9 - 10.3 mg/dL 8.5   9.0     Lipid Panel     Component Value Date/Time   CHOL 192 09/22/2019 0938   TRIG 216 (H) 09/22/2019 0938   HDL 54 09/22/2019 0938   CHOLHDL 3.6 09/22/2019 0938   LDLCALC 101 (H) 09/22/2019 0938    CBC    Component Value Date/Time   WBC 4.1 12/22/2021 1233   RBC 2.75 (L) 12/22/2021 1233   HGB 8.6 (L) 12/22/2021 1233   HGB 8.7 (L) 12/18/2021 1207   HCT 25.0 (L) 12/22/2021 1233   HCT 24.2 (L) 12/18/2021 1207   PLT 125 (L) 12/22/2021 1233   PLT 169 12/18/2021 1207   MCV 90.9 12/22/2021 1233   MCV 91 12/18/2021 1207   MCH 31.3 12/22/2021 1233   MCHC 34.4 12/22/2021 1233   RDW 12.8 12/22/2021 1233   RDW 13.3 12/18/2021 1207   LYMPHSABS 1.1 12/20/2021 1339   LYMPHSABS 1.2 12/18/2021 1207   MONOABS 0.4 12/20/2021 1339   EOSABS 0.0 12/20/2021 1339   EOSABS 0.0 12/18/2021 1207   BASOSABS 0.0 12/20/2021 1339   BASOSABS 0.0 12/18/2021 1207    Lab Results  Component Value Date   HGBA1C 5.6 11/29/2020    Assessment & Plan:  1. 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) Controlled Continue antihypertensive regimen Avoid Nephrotoxins Counseled on blood pressure goal of less than 130/80, low-sodium, DASH diet, medication compliance, 150 minutes of moderate intensity exercise per week. Discussed medication compliance, adverse effects.  - Ambulatory referral to Nephrology  2. Hyponatremia Discharge sodium was 132 - Basic Metabolic Panel  3. Anemia of chronic disease Discharge Hgb was 8.6 She might  be a candidate for Erythropoeitin replacement therapy -  Ambulatory referral to Nephrology  4. Uterine prolapse Pessary in place Follow up with Urogynecology    No orders of the defined types were placed in this encounter.   Follow-up: Return in about 3 months (around 04/01/2022) for Chronic medical conditions.       Charlott Rakes, MD, FAAFP. [facility] [place], [place]   12/30/2021, 11:56 AM

## 2021-12-31 LAB — BASIC METABOLIC PANEL
BUN/Creatinine Ratio: 26 (ref 12–28)
BUN: 54 mg/dL — ABNORMAL HIGH (ref 8–27)
CO2: 20 mmol/L (ref 20–29)
Calcium: 8.8 mg/dL (ref 8.7–10.3)
Chloride: 99 mmol/L (ref 96–106)
Creatinine, Ser: 2.11 mg/dL — ABNORMAL HIGH (ref 0.57–1.00)
Glucose: 137 mg/dL — ABNORMAL HIGH (ref 70–99)
Potassium: 4.2 mmol/L (ref 3.5–5.2)
Sodium: 132 mmol/L — ABNORMAL LOW (ref 134–144)
eGFR: 23 mL/min/{1.73_m2} — ABNORMAL LOW (ref >59)

## 2021-12-31 NOTE — Therapy (Signed)
OUTPATIENT PHYSICAL THERAPY NEURO EVALUATION   Patient Name: Danielle Houston MRN: [id number] DOB:1940-09-30, 81 y.o., female Today's Date: 01/01/2022   PCP: Charlott Rakes REFERRING PROVIDER: Phillips Climes   PT End of Session - 01/01/22 1542     Visit Number 1    Date for PT Re-Evaluation 03/26/22    PT Start Time 1542    PT Stop Time 1615    PT Time Calculation (min) 33 min    Activity Tolerance Patient limited by fatigue;Patient tolerated treatment well    Behavior During Therapy [facility] for tasks assessed/performed             Past Medical History:  Diagnosis Date   Coronary artery disease    Hypertension    Hypothyroidism    Past Surgical History:  Procedure Laterality Date   CARDIAC CATHETERIZATION     CORONARY ANGIOPLASTY     Patient Active Problem List   Diagnosis Date Noted   Hyponatremia 12/20/2021   Overactive bladder 12/18/2021   Benign hypertension with CKD (chronic kidney disease) stage IV ([place]) 06/06/2021   Essential hypertension 04/04/2021   Low back pain 08/03/2019   CAD (coronary artery disease) 08/03/2019   Anemia 08/03/2019   Hypothyroidism 08/03/2019    ONSET DATE: 12/22/21  REFERRING DIAG: E87.1- Hyponatremia  THERAPY DIAG:  No diagnosis found.  Rationale for Evaluation and Treatment Rehabilitation  SUBJECTIVE:                                                                                                                                                                                              SUBJECTIVE STATEMENT: She is feeling really weak, was in the hospital last week. Is feeling cold today but does not have a fever.   Pt accompanied by: family member and interpreter: Silva Bandy  PERTINENT HISTORY: HTN, hypothyroidism, CAD, CKD, overactive bladder  PAIN:  Are you having pain? No  PRECAUTIONS: None  WEIGHT BEARING RESTRICTIONS No  FALLS: Has patient fallen in last 6 months? No  LIVING ENVIRONMENT: Lives with: lives with  their family Lives in: House/apartment Stairs: Yes: External: 6 steps; on right going up Has following equipment at home: Walker - 2 wheeled  PLOF: Independent and Independent with basic ADLs  PATIENT GOALS to not get worse   OBJECTIVE:   COGNITION: Overall cognitive status: Within functional limits for tasks assessed   SENSATION: WFL   POSTURE: rounded shoulders, forward head, and flexed trunk   LOWER EXTREMITY ROM:   grossly WFL    LOWER EXTREMITY MMT:    MMT Right Eval Left Eval  Hip flexion 2+ 2+  Hip extension  Hip abduction 2+ 2+  Hip adduction 2+ 2+   Hip internal rotation    Hip external rotation    Knee flexion 2+ 2+  Knee extension 2+ 2+  Ankle dorsiflexion    Ankle plantarflexion    Ankle inversion    Ankle eversion    (Blank rows = not tested)  GAIT: Gait pattern: step through pattern, decreased arm swing- Right, decreased arm swing- Left, decreased step length- Right, decreased step length- Left, decreased stride length, ataxic, narrow BOS, poor foot clearance- Right, and poor foot clearance- Left Distance walked: in clinic Assistive device utilized:  son reports she is using a walker at home and None Level of assistance: Modified independence Comments: slowed gait, patient holds R arm into flexion and holds on to her jacket   FUNCTIONAL TESTs:  5 times sit to stand: 39.82s Timed up and go (TUG): 42.82s   TODAY'S TREATMENT:  01/01/22- Eval   PATIENT EDUCATION: Education details: POC and HEP Person educated: Patient and Child(ren) Education method: Explanation Education comprehension: verbalized understanding   HOME EXERCISE PROGRAM: [id number]    GOALS: Goals reviewed with patient? No  SHORT TERM GOALS: Target date: 02/12/22  Patient will be independent with initial HEP. Goal status: INITIAL    LONG TERM GOALS: Target date: 03/26/22  Patient will be independent with advanced/ongoing HEP to improve outcomes and carryover.   Goal status: INITIAL  2.  Patient will demonstrate decreased fall risk by scoring < 25 sec on TUG. Baseline: 42.82s Goal status: INITIAL  3.  Patient will demonstrate decreased fall risk by scoring < 25 sec on 5xSTS. Baseline: 39.82s Goal status: INITIAL  4.  Patient will demonstrate improved functional LE strength as demonstrated by 4/5. Baseline: 2+ grossly Goal status: INITIAL      ASSESSMENT:  CLINICAL IMPRESSION: Patient is a 81 y.o. female who was seen today for physical therapy evaluation and treatment for weakness. She was in the hospital for hyponatremia and her family is concerned about her energy levels. Patient seems to be very fatigued and slow in her movements today; not very verbal in her responses. Her son seems to be a bit confused and interpreter was having a hard time translating throughout session. Patient presents with lots of BLE weakness, and will benefit from skilled PT intervention to increase her strength to be able to ambulate with more stability and endurance. Her son states due to work he is unable to bring her more than 1x a week. Was given an HEP to work on every day.   OBJECTIVE IMPAIRMENTS Abnormal gait, decreased balance, decreased coordination, decreased mobility, difficulty walking, and decreased strength.   ACTIVITY LIMITATIONS stairs, transfers, and locomotion level  PERSONAL FACTORS Age, Fitness, and Transportation are also affecting patient's functional outcome.  REHAB POTENTIAL: Fair    CLINICAL DECISION MAKING: Evolving/moderate complexity  EVALUATION COMPLEXITY: Moderate  PLAN: PT FREQUENCY: 2x/week  PT DURATION: 10 weeks  PLANNED INTERVENTIONS: Therapeutic exercises, Therapeutic activity, Neuromuscular re-education, Balance training, Gait training, Patient/Family education, Self Care, Joint mobilization, Stair training, and Manual therapy  PLAN FOR NEXT SESSION: light strengthening as tolerated    Andris Baumann, PT 01/01/2022,  5:03 PM

## 2022-01-01 ENCOUNTER — Ambulatory Visit: Payer: Medicaid Other | Attending: Internal Medicine

## 2022-01-01 ENCOUNTER — Telehealth: Payer: Self-pay

## 2022-01-01 DIAGNOSIS — R262 Difficulty in walking, not elsewhere classified: Secondary | ICD-10-CM | POA: Insufficient documentation

## 2022-01-01 DIAGNOSIS — R293 Abnormal posture: Secondary | ICD-10-CM | POA: Diagnosis present

## 2022-01-01 DIAGNOSIS — R2689 Other abnormalities of gait and mobility: Secondary | ICD-10-CM | POA: Insufficient documentation

## 2022-01-01 DIAGNOSIS — M6281 Muscle weakness (generalized): Secondary | ICD-10-CM | POA: Diagnosis present

## 2022-01-01 DIAGNOSIS — E871 Hypo-osmolality and hyponatremia: Secondary | ICD-10-CM | POA: Diagnosis not present

## 2022-01-01 NOTE — Telephone Encounter (Signed)
-----   Message from Charlott Rakes, MD sent at 12/31/2021  1:31 PM EDT ----- Please inform her that her sodium has improved and is close to normal.  Kidney function is slightly abnormal but I already referred her to a kidney specialist.  Please advise to avoid medications like Aleve and ibuprofen which can worsen kidney function.  Thank you.

## 2022-01-01 NOTE — Telephone Encounter (Signed)
Pt was called and no vm was left due to mailbox not being set up.Information was sent to nurse pool.    Interpreter [phone]

## 2022-01-07 ENCOUNTER — Ambulatory Visit (INDEPENDENT_AMBULATORY_CARE_PROVIDER_SITE_OTHER): Payer: Medicaid Other | Admitting: Obstetrics and Gynecology

## 2022-01-07 ENCOUNTER — Other Ambulatory Visit: Payer: Self-pay

## 2022-01-07 ENCOUNTER — Encounter: Payer: Self-pay | Admitting: Obstetrics and Gynecology

## 2022-01-07 ENCOUNTER — Ambulatory Visit: Payer: Medicaid Other | Admitting: Physical Therapy

## 2022-01-07 VITALS — BP 154/71 | HR 73

## 2022-01-07 DIAGNOSIS — N952 Postmenopausal atrophic vaginitis: Secondary | ICD-10-CM

## 2022-01-07 DIAGNOSIS — N8110 Cystocele, unspecified: Secondary | ICD-10-CM | POA: Diagnosis not present

## 2022-01-07 DIAGNOSIS — N812 Incomplete uterovaginal prolapse: Secondary | ICD-10-CM

## 2022-01-07 MED ORDER — ESTRADIOL 0.1 MG/GM VA CREA
TOPICAL_CREAM | VAGINAL | 11 refills | Status: DC
  Filled 2022-01-07: qty 42.5, 30d supply, fill #0
  Filled 2022-04-04: qty 42.5, 90d supply, fill #0

## 2022-01-07 NOTE — Progress Notes (Signed)
Urogynecology   Subjective:     Chief Complaint:  Chief Complaint  Patient presents with   Pessary Check   History of Present Illness: Danielle Houston is a 81 y.o. female with stage III pelvic organ prolapse who presents for a pessary check. She is using a #4 (2-1/2in) short stem gellhorn pessary. The pessary has been working well.   About one week ago, she had some bleeding and she thinks the pessary came out. Was in the hospital two weeks ago and was not aware of everything that was going on. Sometimes has some light bleeding with walking.   Past Medical History: Patient  has a past medical history of Coronary artery disease, Hypertension, and Hypothyroidism.   Past Surgical History: She  has a past surgical history that includes Cardiac catheterization and Coronary angioplasty.   Medications: She has a current medication list which includes the following prescription(s): allopurinol, aspirin ec, atorvastatin, carvedilol, cyanocobalamin, diclofenac sodium, hydralazine, isosorbide mononitrate, lidocaine, and [DISCONTINUED] gabapentin.   Allergies: Patient has No Known Allergies.   Social History: Patient  reports that she has never smoked. She has never used smokeless tobacco. She reports that she does not drink alcohol and does not use drugs.      Objective:    Physical Exam: BP (!) 154/71   Pulse 73   LMP  (LMP Unknown)  Gen: No apparent distress, A&O x 3.  Detailed Urogynecologic Evaluation:  Pelvic Exam: Normal external female genitalia; Bartholin's and Skene's glands normal in appearance; urethral meatus normal in appearance, no urethral masses or discharge. The pessary was noted to be in place. It was removed and cleaned. Speculum exam revealed no lesions in the vagina. The pessary was replaced. It was comfortable to the patient and fit well.   POP-Q:    POP-Q   0                                            Aa   0                                            Ba   1.5                                              C    4.5                                            Gh   3                                            Pb   8                                            tvl    -  2                                            Ap   -2                                            Bp   -6                                              D      Assessment/Plan:    Assessment: [name] is a 81 y.o. with stage III pelvic organ prolapse here for a pessary check. She is doing well.  Plan: - Continue 2-1/2 in gellhorn pessary.  - She will keep the pessary in place until next visit.  - Will start estrace cream twice a week for vaginal atrophy- sent to pharmacy - Follow up 3 months  Jaquita Folds, MD

## 2022-01-15 ENCOUNTER — Ambulatory Visit: Payer: Medicaid Other | Attending: Internal Medicine

## 2022-01-20 ENCOUNTER — Ambulatory Visit: Payer: Medicaid Other

## 2022-01-23 ENCOUNTER — Other Ambulatory Visit: Payer: Self-pay

## 2022-01-27 ENCOUNTER — Ambulatory Visit: Payer: Medicaid Other

## 2022-02-03 ENCOUNTER — Ambulatory Visit: Payer: Medicaid Other

## 2022-02-10 ENCOUNTER — Ambulatory Visit: Payer: Medicaid Other

## 2022-02-17 ENCOUNTER — Other Ambulatory Visit: Payer: Self-pay

## 2022-02-26 ENCOUNTER — Other Ambulatory Visit: Payer: Self-pay

## 2022-03-19 ENCOUNTER — Other Ambulatory Visit: Payer: Self-pay | Admitting: Family Medicine

## 2022-03-19 ENCOUNTER — Encounter: Payer: Self-pay | Admitting: Family Medicine

## 2022-03-19 ENCOUNTER — Other Ambulatory Visit: Payer: Self-pay

## 2022-03-19 DIAGNOSIS — I129 Hypertensive chronic kidney disease with stage 1 through stage 4 chronic kidney disease, or unspecified chronic kidney disease: Secondary | ICD-10-CM

## 2022-03-20 ENCOUNTER — Other Ambulatory Visit: Payer: Self-pay

## 2022-03-21 ENCOUNTER — Other Ambulatory Visit: Payer: Self-pay

## 2022-03-24 ENCOUNTER — Other Ambulatory Visit: Payer: Self-pay

## 2022-04-01 ENCOUNTER — Ambulatory Visit: Payer: Medicaid Other | Admitting: Family Medicine

## 2022-04-02 ENCOUNTER — Other Ambulatory Visit: Payer: Self-pay

## 2022-04-02 ENCOUNTER — Ambulatory Visit: Payer: Medicaid Other | Attending: Family Medicine | Admitting: Family Medicine

## 2022-04-02 ENCOUNTER — Encounter: Payer: Self-pay | Admitting: Family Medicine

## 2022-04-02 VITALS — BP 186/80 | HR 64 | Ht 62.0 in | Wt 102.4 lb

## 2022-04-02 DIAGNOSIS — I10 Essential (primary) hypertension: Secondary | ICD-10-CM | POA: Diagnosis not present

## 2022-04-02 DIAGNOSIS — E2839 Other primary ovarian failure: Secondary | ICD-10-CM | POA: Diagnosis not present

## 2022-04-02 DIAGNOSIS — M542 Cervicalgia: Secondary | ICD-10-CM

## 2022-04-02 DIAGNOSIS — R946 Abnormal results of thyroid function studies: Secondary | ICD-10-CM

## 2022-04-02 DIAGNOSIS — N183 Chronic kidney disease, stage 3 unspecified: Secondary | ICD-10-CM

## 2022-04-02 DIAGNOSIS — I129 Hypertensive chronic kidney disease with stage 1 through stage 4 chronic kidney disease, or unspecified chronic kidney disease: Secondary | ICD-10-CM

## 2022-04-02 MED ORDER — LIDOCAINE 5 % EX PTCH
1.0000 | MEDICATED_PATCH | CUTANEOUS | 1 refills | Status: DC
  Filled 2022-04-02: qty 30, 30d supply, fill #0
  Filled 2022-08-22 (×3): qty 30, 30d supply, fill #1

## 2022-04-02 MED ORDER — HYDRALAZINE HCL 100 MG PO TABS
100.0000 mg | ORAL_TABLET | Freq: Two times a day (BID) | ORAL | 6 refills | Status: AC
  Filled 2022-04-02 – 2022-06-20 (×2): qty 90, 45d supply, fill #0
  Filled 2022-08-22 (×3): qty 90, 45d supply, fill #1
  Filled 2022-09-19 – 2022-10-17 (×2): qty 90, 45d supply, fill #2

## 2022-04-02 MED ORDER — DICLOFENAC SODIUM 1 % EX GEL
CUTANEOUS | 1 refills | Status: DC
  Filled 2022-04-02: qty 100, fill #0
  Filled 2022-04-04: qty 100, 13d supply, fill #0

## 2022-04-02 MED ORDER — HYDRALAZINE HCL 10 MG PO TABS
50.0000 mg | ORAL_TABLET | Freq: Once | ORAL | Status: AC
  Administered 2022-04-02: 50 mg via ORAL

## 2022-04-02 MED ORDER — ISOSORBIDE MONONITRATE ER 120 MG PO TB24
120.0000 mg | ORAL_TABLET | Freq: Every day | ORAL | 3 refills | Status: DC
  Filled 2022-04-02 – 2022-04-22 (×2): qty 30, 30d supply, fill #0
  Filled 2022-05-21 (×2): qty 30, 30d supply, fill #1

## 2022-04-02 NOTE — Patient Instructions (Signed)
Please call to set up your appointment: [facility]  Ph. # [phone] Address [address] [place], [address]

## 2022-04-02 NOTE — Progress Notes (Signed)
Subjective:  Patient ID: Danielle Houston, female    DOB: 1940-05-27  Age: 82 y.o. MRN: [id number]  CC: Hypertension   HPI Danielle Houston is a 82 y.o. year old female with a history of CAD s/p CABG  (in [place]), hypothyroidism, prediabetes (A1c 5.7), essential hypertension, T9 compression fracture after a fall, uterovaginal prolapse, stage III CKD. Accompanied by her son to this visit and she is seen today with the aid of a video interpreter.  Interval History:  I reviewed the bottles she has with her and her Hydralazine is missing.  She does have carvedilol and isosorbide and endorses taking them this morning.  Blood pressure is significantly elevated. I had referred her to [facility] in the fall of last year however she is yet to be seen by nephrology. She Complains of pain in her neck and upper back at the site that was traumatized when she fell in the past.  States she does not have any medication for pain. Her med list reveals she should be on Lidoderm patches and Voltaren gel. Past Medical History:  Diagnosis Date   Coronary artery disease    Hypertension    Hypothyroidism     Past Surgical History:  Procedure Laterality Date   CARDIAC CATHETERIZATION     CORONARY ANGIOPLASTY      Family History  Problem Relation Age of Onset   CAD Neg Hx     Social History   Socioeconomic History   Marital status: Single    Spouse name: Not on file   Number of children: Not on file   Years of education: Not on file   Highest education level: Not on file  Occupational History   Not on file  Tobacco Use   Smoking status: Never   Smokeless tobacco: Never  Substance and Sexual Activity   Alcohol use: Never   Drug use: Never   Sexual activity: Not on file  Other Topics Concern   Not on file  Social History Narrative   Not on file   Social Determinants of Health   Financial Resource Strain: Not on file  Food Insecurity: Not on file  Transportation Needs: Not on file   Physical Activity: Not on file  Stress: Not on file  Social Connections: Not on file    No Known Allergies  Outpatient Medications Prior to Visit  Medication Sig Dispense Refill   allopurinol (ZYLOPRIM) 100 MG tablet Take 1 tablet (100 mg total) by mouth daily. 30 tablet 6   aspirin EC 81 MG tablet Take 1 tablet (81 mg total) by mouth daily. Swallow whole. 30 tablet 11   atorvastatin (LIPITOR) 40 MG tablet TAKE 1 TABLET (40 MG TOTAL) BY MOUTH DAILY. 30 tablet 6   carvedilol (COREG) 12.5 MG tablet TAKE 1 TABLET (12.5 MG TOTAL) BY MOUTH 2 (TWO) TIMES DAILY WITH A MEAL. 60 tablet 6   cyanocobalamin (VITAMIN B12) 1000 MCG tablet Take 1 tablet (1,000 mcg total) by mouth daily. 30 tablet 2   estradiol (ESTRACE) 0.1 MG/GM vaginal cream Place 0.5 grams vaginally nightly twice a week. 42.5 g 11   diclofenac Sodium (VOLTAREN) 1 % GEL APPLY 2 Grams TOPICALLY 4 (FOUR) TIMES DAILY as needed for PAIN 100 g 0   hydrALAZINE (APRESOLINE) 100 MG tablet Take 1 tablet (100 mg total) by mouth 2 (two) times daily. 90 tablet 6   isosorbide mononitrate (IMDUR) 120 MG 24 hr tablet Take 1 tablet (120 mg total) by mouth daily. 30 tablet  3   lidocaine (LIDODERM) 5 % Place 1 patch onto the skin daily. Remove & Discard patch within 12 hours or as directed by MD 30 patch 1   No facility-administered medications prior to visit.     ROS Review of Systems  Constitutional:  Negative for activity change and appetite change.  HENT:  Negative for sinus pressure and sore throat.   Respiratory:  Negative for chest tightness, shortness of breath and wheezing.   Cardiovascular:  Negative for chest pain and palpitations.  Gastrointestinal:  Negative for abdominal distention, abdominal pain and constipation.  Genitourinary: Negative.   Musculoskeletal:  Positive for neck pain.  Psychiatric/Behavioral:  Negative for behavioral problems and dysphoric mood.     Objective:  BP (!) 186/80   Pulse 64   Ht 5\' 2"  (1.575 m)    Wt 102 lb 6.4 oz (46.4 kg)   LMP  (LMP Unknown)   SpO2 100%   BMI 18.73 kg/m      04/02/2022   10:49 AM 04/02/2022   10:09 AM 04/02/2022    9:40 AM  BP/Weight  Systolic BP 035 009 381  Diastolic BP 80 82 82  Wt. (Lbs)   102.4  BMI   18.73 kg/m2      Physical Exam Constitutional:      Appearance: She is well-developed.  Cardiovascular:     Rate and Rhythm: Normal rate.     Heart sounds: Normal heart sounds. No murmur heard. Pulmonary:     Effort: Pulmonary effort is normal.     Breath sounds: Normal breath sounds. No wheezing or rales.  Chest:     Chest wall: No tenderness.  Abdominal:     General: Bowel sounds are normal. There is no distension.     Palpations: Abdomen is soft. There is no mass.     Tenderness: There is no abdominal tenderness.  Musculoskeletal:        General: Normal range of motion.     Cervical back: Normal range of motion. No rigidity.     Right lower leg: No edema.     Left lower leg: No edema.  Neurological:     Mental Status: She is alert and oriented to person, place, and time.  Psychiatric:        Mood and Affect: Mood normal.        Latest Ref Rng & Units 12/30/2021   10:52 AM 12/22/2021   12:33 PM 12/21/2021    8:11 AM  CMP  Glucose 70 - 99 mg/dL 137  118    BUN 8 - 27 mg/dL 54  23    Creatinine 0.57 - 1.00 mg/dL 2.11  1.78    Sodium 134 - 144 mmol/L 132  129  125   Potassium 3.5 - 5.2 mmol/L 4.2  3.6    Chloride 96 - 106 mmol/L 99  101    CO2 20 - 29 mmol/L 20  21    Calcium 8.7 - 10.3 mg/dL 8.8  8.5      Lipid Panel     Component Value Date/Time   CHOL 192 09/22/2019 0938   TRIG 216 (H) 09/22/2019 0938   HDL 54 09/22/2019 0938   CHOLHDL 3.6 09/22/2019 0938   LDLCALC 101 (H) 09/22/2019 0938    CBC    Component Value Date/Time   WBC 4.1 12/22/2021 1233   RBC 2.75 (L) 12/22/2021 1233   HGB 8.6 (L) 12/22/2021 1233   HGB 8.7 (L) 12/18/2021 1207  HCT 25.0 (L) 12/22/2021 1233   HCT 24.2 (L) 12/18/2021 1207   PLT 125  (L) 12/22/2021 1233   PLT 169 12/18/2021 1207   MCV 90.9 12/22/2021 1233   MCV 91 12/18/2021 1207   MCH 31.3 12/22/2021 1233   MCHC 34.4 12/22/2021 1233   RDW 12.8 12/22/2021 1233   RDW 13.3 12/18/2021 1207   LYMPHSABS 1.1 12/20/2021 1339   LYMPHSABS 1.2 12/18/2021 1207   MONOABS 0.4 12/20/2021 1339   EOSABS 0.0 12/20/2021 1339   EOSABS 0.0 12/18/2021 1207   BASOSABS 0.0 12/20/2021 1339   BASOSABS 0.0 12/18/2021 1207    Lab Results  Component Value Date   HGBA1C 5.6 11/29/2020    Lab Results  Component Value Date   TSH 5.640 (H) 12/18/2021    Assessment & Plan:  1. Benign hypertension with CKD (chronic kidney disease) stage III (HCC) Hypertensive nephropathy Referred to nephrology in 12/2021 but apparently she has not been seen yet Provided with the number to [facility] to call for an appointment Avoid nephrotoxins - hydrALAZINE (APRESOLINE) 100 MG tablet; Take 1 tablet (100 mg total) by mouth 2 (two) times daily.  Dispense: 90 tablet; Refill: 6 - CMP14+EGFR  2. Estrogen deficiency - DG Bone Density; Future  3. Neck pain She has run out of her pain medications which I have refilled - diclofenac Sodium (VOLTAREN) 1 % GEL; APPLY 2 Grams TOPICALLY 4 (FOUR) TIMES DAILY as needed for PAIN  Dispense: 100 g; Refill: 1 - lidocaine (LIDODERM) 5 %; Place 1 patch onto the skin daily. Remove & Discard patch within 12 hours or as directed by MD  Dispense: 30 patch; Refill: 1  4. Essential hypertension Elevated hypertension due to running out of hydralazine Hydralazine 50 mg administered in clinic and repeat blood pressure repeated and after 30 minutes to be 186/80 I have advised him to stop by the pharmacy to pick up hydralazine prescription Reassess blood pressure at next visit in 1 month and consider switching from isosorbide to amlodipine if blood pressure is still above goal Counseled on blood pressure goal of less than 130/80, low-sodium, DASH diet, medication  compliance, 150 minutes of moderate intensity exercise per week. Discussed medication compliance, adverse effects. - isosorbide mononitrate (IMDUR) 120 MG 24 hr tablet; Take 1 tablet (120 mg total) by mouth daily.  Dispense: 30 tablet; Refill: 3 - hydrALAZINE (APRESOLINE) tablet 50 mg  5. Abnormal thyroid function test Elevated TSH in 12/2021 Will check again as labs were obtained in the setting of acute illness - T4, free - TSH - T3    Meds ordered this encounter  Medications   hydrALAZINE (APRESOLINE) 100 MG tablet    Sig: Take 1 tablet (100 mg total) by mouth 2 (two) times daily.    Dispense:  90 tablet    Refill:  6    Dose change   diclofenac Sodium (VOLTAREN) 1 % GEL    Sig: APPLY 2 Grams TOPICALLY 4 (FOUR) TIMES DAILY as needed for PAIN    Dispense:  100 g    Refill:  1   isosorbide mononitrate (IMDUR) 120 MG 24 hr tablet    Sig: Take 1 tablet (120 mg total) by mouth daily.    Dispense:  30 tablet    Refill:  3   lidocaine (LIDODERM) 5 %    Sig: Place 1 patch onto the skin daily. Remove & Discard patch within 12 hours or as directed by MD    Dispense:  30  patch    Refill:  1   hydrALAZINE (APRESOLINE) tablet 50 mg    Follow-up: Return in about 1 month (around 05/03/2022) for Blood Pressure follow-up.       Charlott Rakes, MD, FAAFP. [facility], [place]   04/02/2022, 10:52 AM

## 2022-04-03 ENCOUNTER — Other Ambulatory Visit: Payer: Self-pay | Admitting: Family Medicine

## 2022-04-03 ENCOUNTER — Other Ambulatory Visit: Payer: Self-pay

## 2022-04-03 DIAGNOSIS — E875 Hyperkalemia: Secondary | ICD-10-CM

## 2022-04-03 LAB — CMP14+EGFR
ALT: 18 [IU]/L (ref 0–32)
AST: 22 [IU]/L (ref 0–40)
Albumin/Globulin Ratio: 1.3 (ref 1.2–2.2)
Albumin: 4.2 g/dL (ref 3.7–4.7)
Alkaline Phosphatase: 128 [IU]/L — ABNORMAL HIGH (ref 44–121)
BUN/Creatinine Ratio: 23 (ref 12–28)
BUN: 54 mg/dL — ABNORMAL HIGH (ref 8–27)
Bilirubin Total: 0.3 mg/dL (ref 0.0–1.2)
CO2: 22 mmol/L (ref 20–29)
Calcium: 9.6 mg/dL (ref 8.7–10.3)
Chloride: 106 mmol/L (ref 96–106)
Creatinine, Ser: 2.4 mg/dL — ABNORMAL HIGH (ref 0.57–1.00)
Globulin, Total: 3.3 g/dL (ref 1.5–4.5)
Glucose: 123 mg/dL — ABNORMAL HIGH (ref 70–99)
Potassium: 5.7 mmol/L — ABNORMAL HIGH (ref 3.5–5.2)
Sodium: 142 mmol/L (ref 134–144)
Total Protein: 7.5 g/dL (ref 6.0–8.5)
eGFR: 20 mL/min/{1.73_m2} — ABNORMAL LOW

## 2022-04-03 LAB — TSH: TSH: 2.68 u[IU]/mL (ref 0.450–4.500)

## 2022-04-03 LAB — T3: T3, Total: 105 ng/dL (ref 71–180)

## 2022-04-03 LAB — T4, FREE: Free T4: 1.06 ng/dL (ref 0.82–1.77)

## 2022-04-03 MED ORDER — LOKELMA 5 G PO PACK
10.0000 g | PACK | Freq: Every day | ORAL | 0 refills | Status: DC
  Filled 2022-04-03 (×2): qty 5, 2d supply, fill #0

## 2022-04-04 ENCOUNTER — Other Ambulatory Visit: Payer: Self-pay

## 2022-04-04 NOTE — Progress Notes (Addendum)
 Urogynecology   Subjective:     Chief Complaint:  Chief Complaint  Patient presents with   Follow-up    Danielle Houston is a 82 y.o. female is here for pessary check.   History of Present Illness: Danielle Houston is a 82 y.o. female with stage III pelvic organ prolapse who presents for a pessary check. She is using a size 2 1/2 short stem gellhorn pessary. The pessary has been working well and she has no complaints. She is not using vaginal estrogen. She denies vaginal bleeding.  Past Medical History: Patient  has a past medical history of Coronary artery disease, Hypertension, and Hypothyroidism.   Past Surgical History: She  has a past surgical history that includes Cardiac catheterization and Coronary angioplasty.   Medications: She has a current medication list which includes the following prescription(s): allopurinol, aspirin ec, atorvastatin, carvedilol, cyanocobalamin, diclofenac sodium, estradiol, hydralazine, isosorbide mononitrate, lidocaine, lokelma, and [DISCONTINUED] gabapentin.   Allergies: Patient has No Known Allergies.   Social History: Patient  reports that she has never smoked. She has never used smokeless tobacco. She reports that she does not drink alcohol and does not use drugs.      Objective:    Physical Exam: BP (!) 147/65   Pulse 68   LMP  (LMP Unknown)  Gen: No apparent distress, A&O x 3. Detailed Urogynecologic Evaluation:  Pelvic Exam: Normal external female genitalia; Bartholin's and Skene's glands normal in appearance; urethral meatus with caruncle, no urethral masses or discharge. The pessary was noted to be in place. It was removed and cleaned. Speculum exam revealed no lesions in the vagina. The pessary was replaced. It was comfortable to the patient and fit well.     Assessment/Plan:    Assessment: [name] is a 82 y.o. with stage III pelvic organ prolapse here for a pessary check. She is doing well.  Plan: She will keep the  pessary in place until next visit. She will continue to use estrogen.   We discussed the need for the Premarin vaginal cream. There is a prescription at the pharmacy for them to pick up. Patient has vaginal atrophy with pessary use and we want to limit the irritation in the vagina by keeping the tissue as healthy as possible. Patient's son reports that he understands. She is to use it nightly for the next 2 weeks and then twice weekly moving forward.   Of note, patient's blood pressure was elevated today. Patient's son was made aware and encouraged them to follow up with PCP. Upon re-check, patient's blood pressure was still elevated.   She will follow-up in 3 months for a pessary check or sooner as needed.

## 2022-04-05 ENCOUNTER — Other Ambulatory Visit (HOSPITAL_COMMUNITY): Payer: Self-pay

## 2022-04-08 ENCOUNTER — Encounter: Payer: Self-pay | Admitting: Obstetrics and Gynecology

## 2022-04-08 ENCOUNTER — Other Ambulatory Visit: Payer: Self-pay

## 2022-04-08 ENCOUNTER — Ambulatory Visit (INDEPENDENT_AMBULATORY_CARE_PROVIDER_SITE_OTHER): Payer: Medicaid Other | Admitting: Obstetrics and Gynecology

## 2022-04-08 VITALS — BP 153/61 | HR 67

## 2022-04-08 DIAGNOSIS — I10 Essential (primary) hypertension: Secondary | ICD-10-CM

## 2022-04-08 DIAGNOSIS — N952 Postmenopausal atrophic vaginitis: Secondary | ICD-10-CM | POA: Diagnosis not present

## 2022-04-08 DIAGNOSIS — N816 Rectocele: Secondary | ICD-10-CM | POA: Diagnosis not present

## 2022-04-08 DIAGNOSIS — Z4689 Encounter for fitting and adjustment of other specified devices: Secondary | ICD-10-CM

## 2022-04-08 DIAGNOSIS — N8110 Cystocele, unspecified: Secondary | ICD-10-CM | POA: Diagnosis not present

## 2022-04-08 DIAGNOSIS — N812 Incomplete uterovaginal prolapse: Secondary | ICD-10-CM

## 2022-04-08 NOTE — Patient Instructions (Signed)
Use the estrogen cream every night for two weeks and then twice a week after that.

## 2022-04-09 ENCOUNTER — Other Ambulatory Visit: Payer: Self-pay

## 2022-04-09 ENCOUNTER — Other Ambulatory Visit (HOSPITAL_COMMUNITY): Payer: Self-pay

## 2022-04-16 ENCOUNTER — Other Ambulatory Visit: Payer: Self-pay

## 2022-04-17 ENCOUNTER — Other Ambulatory Visit: Payer: Self-pay

## 2022-04-18 ENCOUNTER — Other Ambulatory Visit: Payer: Self-pay

## 2022-04-21 ENCOUNTER — Other Ambulatory Visit: Payer: Self-pay

## 2022-04-21 MED ORDER — ESTROGENS CONJUGATED 0.625 MG/GM VA CREA
1.0000 | TOPICAL_CREAM | VAGINAL | 11 refills | Status: DC
  Filled 2022-04-21: qty 30, 52d supply, fill #0
  Filled 2022-08-22 (×2): qty 30, 52d supply, fill #1

## 2022-04-21 NOTE — Progress Notes (Signed)
Outcome Denied on February 2 Denied. Per the health plan's preferred drug list, at least 2 preferred drugs must be tried before requesting this drug or tell us why the member cannot try any preferred alternatives. Please send [place] supporting chart notes and lab results. Here is list of preferred alternatives: Estring vaginal ring, Premarin vaginal cream, Vagifem vaginal cream. Note: Some preferred drug(s) may have quantity limits. Refer to the health plan's preferred drug list for additional details.   PA Case ID #: [id number]

## 2022-04-21 NOTE — Addendum Note (Signed)
Addended by: Berton Mount on: 04/21/2022 08:46 AM   Modules accepted: Orders

## 2022-04-22 ENCOUNTER — Other Ambulatory Visit: Payer: Self-pay

## 2022-04-23 ENCOUNTER — Other Ambulatory Visit: Payer: Self-pay

## 2022-04-24 ENCOUNTER — Ambulatory Visit (HOSPITAL_BASED_OUTPATIENT_CLINIC_OR_DEPARTMENT_OTHER)
Admission: RE | Admit: 2022-04-24 | Discharge: 2022-04-24 | Disposition: A | Discharge status: Home / Self Care | Payer: Medicaid Other | Source: Ambulatory Visit | Attending: Family Medicine | Admitting: Family Medicine

## 2022-04-24 DIAGNOSIS — E2839 Other primary ovarian failure: Secondary | ICD-10-CM | POA: Insufficient documentation

## 2022-04-25 ENCOUNTER — Other Ambulatory Visit: Payer: Self-pay | Admitting: Family Medicine

## 2022-04-25 ENCOUNTER — Other Ambulatory Visit: Payer: Self-pay

## 2022-04-25 DIAGNOSIS — M818 Other osteoporosis without current pathological fracture: Secondary | ICD-10-CM

## 2022-04-25 MED ORDER — ALENDRONATE SODIUM 70 MG PO TABS
70.0000 mg | ORAL_TABLET | ORAL | 1 refills | Status: DC
  Filled 2022-04-25 – 2022-05-05 (×2): qty 12, 84d supply, fill #0
  Filled 2022-08-22 (×3): qty 12, 84d supply, fill #1

## 2022-05-01 ENCOUNTER — Other Ambulatory Visit: Payer: Self-pay

## 2022-05-05 ENCOUNTER — Ambulatory Visit: Payer: Medicaid Other | Attending: Family Medicine | Admitting: Family Medicine

## 2022-05-05 ENCOUNTER — Encounter: Payer: Self-pay | Admitting: *Deleted

## 2022-05-05 ENCOUNTER — Other Ambulatory Visit: Payer: Self-pay

## 2022-05-05 ENCOUNTER — Encounter: Payer: Self-pay | Admitting: Family Medicine

## 2022-05-05 VITALS — BP 150/70 | HR 69 | Ht 62.0 in | Wt 110.4 lb

## 2022-05-05 DIAGNOSIS — R140 Abdominal distension (gaseous): Secondary | ICD-10-CM

## 2022-05-05 DIAGNOSIS — I10 Essential (primary) hypertension: Secondary | ICD-10-CM | POA: Diagnosis not present

## 2022-05-05 MED ORDER — OMEPRAZOLE 20 MG PO CPDR: 20.0000 mg | DELAYED_RELEASE_CAPSULE | Freq: Two times a day (BID) | ORAL | 0 refills | Status: DC

## 2022-05-05 MED ORDER — AMLODIPINE BESYLATE 5 MG PO TABS: 5.0000 mg | ORAL_TABLET | Freq: Every day | ORAL | 1 refills | Status: DC

## 2022-05-05 NOTE — Patient Instructions (Signed)
Abdominal Bloating When you have abdominal bloating, your abdomen may feel full, tight, or painful. It may also look bigger than normal or swollen (distended). Common causes of abdominal bloating include: Swallowing air. Constipation. Problems digesting food. Eating too much. Irritable bowel syndrome. This is a condition that affects the large intestine. Lactose intolerance. This is an inability to digest lactose, a natural sugar in dairy products. Celiac disease. This is a condition that affects the ability to digest gluten, a protein found in some grains. Gastroparesis. This is a condition that slows down the movement of food in the stomach and small intestine. It is more common in people with diabetes mellitus. Gastroesophageal reflux disease (GERD). This is a condition that makes stomach acid flow back into the esophagus. Urinary retention. This means that the body is holding onto urine, and the bladder cannot be emptied all the way. Follow these instructions at home: Eating and drinking Avoid eating too much. Try not to swallow air while talking or eating. Avoid eating while lying down. Avoid these foods and drinks: Foods that cause gas, such as broccoli, cabbage, cauliflower, and baked beans. Carbonated drinks. Hard candy. Chewing gum. Medicines Take over-the-counter and prescription medicines only as told by your health care provider. Take probiotic medicines. These medicines contain live bacteria or yeasts that can help digestion. Take coated peppermint oil capsules. General instructions Try to exercise regularly. Exercise may help to relieve bloating that is caused by gas and relieve constipation. Keep all follow-up visits. This is important. Contact a health care provider if: You have nausea and vomiting. You have diarrhea. You have abdominal pain. You have unusual weight loss or weight gain. You have severe pain, and medicines do not help. Get help right away if: You  have chest pain. You have trouble breathing. You have shortness of breath. You have trouble urinating. You have darker urine than normal. You have blood in your stools or have dark, tarry stools. These symptoms may represent a serious problem that is an emergency. Do not wait to see if the symptoms will go away. Get medical help right away. Call your local emergency services (911 in the [place] Do not drive yourself to the hospital. Summary Abdominal bloating means that the abdomen is swollen. Common causes of abdominal bloating are swallowing air, constipation, and problems digesting food. Avoid eating too much and avoid swallowing air. Avoid foods that cause gas, carbonated drinks, hard candy, and chewing gum. This information is not intended to replace advice given to you by your health care provider. Make sure you discuss any questions you have with your health care provider. Document Revised: 10/04/2019 Document Reviewed: 10/04/2019 Elsevier Patient Education  2023 Elsevier Inc.  

## 2022-05-05 NOTE — Progress Notes (Signed)
Subjective:  Patient ID: Danielle Houston, female    DOB: [birth year]  Age: 82 y.o. MRN: [id number]  CC: Hypertension   HPI Danielle Houston is a 82 y.o. year old female with a history of CAD s/p CABG  (in [place]), hypothyroidism, prediabetes (A1c 5.7), essential hypertension, T9 compression fracture after a fall, uterovaginal prolapse, stage III CKD. Accompanied by her son to this visit and she is seen today with the aid of a video interpreter.  Interval History:  Presents today for reevaluation of her blood pressure which is still elevated.  She endorses adherence with her antihypertensives and took them this morning. In addition she complains of abdominal bloating and sometimes has reflux.  Denies eating late.  She has no hematochezia, melena, nausea or vomiting.  Denies presence of diarrhea or constipation.  Past Medical History:  Diagnosis Date   Coronary artery disease    Hypertension    Hypothyroidism     Past Surgical History:  Procedure Laterality Date   CARDIAC CATHETERIZATION     CORONARY ANGIOPLASTY      Family History  Problem Relation Age of Onset   CAD Neg Hx     Social History   Socioeconomic History   Marital status: Single    Spouse name: Not on file   Number of children: Not on file   Years of education: Not on file   Highest education level: Not on file  Occupational History   Not on file  Tobacco Use   Smoking status: Never   Smokeless tobacco: Never  Substance and Sexual Activity   Alcohol use: Never   Drug use: Never   Sexual activity: Not on file  Other Topics Concern   Not on file  Social History Narrative   Not on file   Social Determinants of Health   Financial Resource Strain: Not on file  Food Insecurity: Not on file  Transportation Needs: Not on file  Physical Activity: Not on file  Stress: Not on file  Social Connections: Not on file    No Known Allergies  Outpatient Medications Prior to Visit  Medication Sig Dispense Refill    allopurinol (ZYLOPRIM) 100 MG tablet Take 1 tablet (100 mg total) by mouth daily. 30 tablet 6   aspirin EC 81 MG tablet Take 1 tablet (81 mg total) by mouth daily. Swallow whole. 30 tablet 11   atorvastatin (LIPITOR) 40 MG tablet TAKE 1 TABLET (40 MG TOTAL) BY MOUTH DAILY. 30 tablet 6   carvedilol (COREG) 12.5 MG tablet TAKE 1 TABLET (12.5 MG TOTAL) BY MOUTH 2 (TWO) TIMES DAILY WITH A MEAL. 60 tablet 6   conjugated estrogens (PREMARIN) vaginal cream Place 0.5 g vaginally nightly for two weeks then twice a week thereafter 30 g 11   cyanocobalamin (VITAMIN B12) 1000 MCG tablet Take 1 tablet (1,000 mcg total) by mouth daily. 30 tablet 2   diclofenac Sodium (VOLTAREN) 1 % GEL APPLY 2 Grams TOPICALLY 4 (FOUR) TIMES DAILY as needed for PAIN 100 g 1   hydrALAZINE (APRESOLINE) 100 MG tablet Take 1 tablet (100 mg total) by mouth 2 (two) times daily. 90 tablet 6   isosorbide mononitrate (IMDUR) 120 MG 24 hr tablet Take 1 tablet (120 mg total) by mouth daily. 30 tablet 3   lidocaine (LIDODERM) 5 % Place 1 patch onto the skin daily. Remove & Discard patch within 12 hours or as directed by MD 30 patch 1   alendronate (FOSAMAX) 70 MG tablet Take 1 tablet (70  mg total) by mouth every 7 (seven) days. Take with a full glass of water on an empty stomach. (Patient not taking: Reported on 05/05/2022) 12 tablet 1   sodium zirconium cyclosilicate (LOKELMA) 5 g packet Take 10 g by mouth daily. (Patient not taking: Reported on 05/05/2022) 5 each 0   No facility-administered medications prior to visit.     ROS Review of Systems  Constitutional:  Negative for activity change and appetite change.  HENT:  Negative for sinus pressure and sore throat.   Respiratory:  Negative for chest tightness, shortness of breath and wheezing.   Cardiovascular:  Negative for chest pain and palpitations.  Gastrointestinal:  Negative for abdominal distention, abdominal pain and constipation.  Genitourinary: Negative.   Musculoskeletal:  Negative.   Psychiatric/Behavioral:  Negative for behavioral problems and dysphoric mood.     Objective:  BP (!) 150/70   Pulse 69   Ht 5' 2"$  (1.575 m)   Wt 110 lb 6.4 oz (50.1 kg)   LMP  (LMP Unknown)   SpO2 99%   BMI 20.19 kg/m      05/05/2022   10:10 AM 05/05/2022    9:40 AM 04/08/2022    2:22 PM  BP/Weight  Systolic BP [id number]  Diastolic BP 70 65 61  Wt. (Lbs)  110.4   BMI  20.19 kg/m2       Physical Exam Constitutional:      Appearance: She is well-developed.  Cardiovascular:     Rate and Rhythm: Normal rate.     Heart sounds: Normal heart sounds. No murmur heard. Pulmonary:     Effort: Pulmonary effort is normal.     Breath sounds: Normal breath sounds. No wheezing or rales.  Chest:     Chest wall: No tenderness.  Abdominal:     General: Bowel sounds are normal. There is no distension.     Palpations: Abdomen is soft. There is no mass.     Tenderness: There is no abdominal tenderness.  Musculoskeletal:        General: Normal range of motion.     Right lower leg: No edema.     Left lower leg: No edema.  Neurological:     Mental Status: She is alert and oriented to person, place, and time.  Psychiatric:        Mood and Affect: Mood normal.        Latest Ref Rng & Units 04/02/2022   10:54 AM 12/30/2021   10:52 AM 12/22/2021   12:33 PM  CMP  Glucose 70 - 99 mg/dL 123  137  118   BUN 8 - 27 mg/dL 54  54  23   Creatinine 0.57 - 1.00 mg/dL 2.40  2.11  1.78   Sodium 134 - 144 mmol/L 142  132  129   Potassium 3.5 - 5.2 mmol/L 5.7  4.2  3.6   Chloride 96 - 106 mmol/L 106  99  101   CO2 20 - 29 mmol/L 22  20  21   $ Calcium 8.7 - 10.3 mg/dL 9.6  8.8  8.5   Total Protein 6.0 - 8.5 g/dL 7.5     Total Bilirubin 0.0 - 1.2 mg/dL 0.3     Alkaline Phos 44 - 121 IU/L 128     AST 0 - 40 IU/L 22     ALT 0 - 32 IU/L 18       Lipid Panel     Component Value Date/Time   CHOL  192 09/22/2019 0938   TRIG 216 (H) 09/22/2019 0938   HDL 54 09/22/2019 0938    CHOLHDL 3.6 09/22/2019 0938   LDLCALC 101 (H) 09/22/2019 0938    CBC    Component Value Date/Time   WBC 4.1 12/22/2021 1233   RBC 2.75 (L) 12/22/2021 1233   HGB 8.6 (L) 12/22/2021 1233   HGB 8.7 (L) 12/18/2021 1207   HCT 25.0 (L) 12/22/2021 1233   HCT 24.2 (L) 12/18/2021 1207   PLT 125 (L) 12/22/2021 1233   PLT 169 12/18/2021 1207   MCV 90.9 12/22/2021 1233   MCV 91 12/18/2021 1207   MCH 31.3 12/22/2021 1233   MCHC 34.4 12/22/2021 1233   RDW 12.8 12/22/2021 1233   RDW 13.3 12/18/2021 1207   LYMPHSABS 1.1 12/20/2021 1339   LYMPHSABS 1.2 12/18/2021 1207   MONOABS 0.4 12/20/2021 1339   EOSABS 0.0 12/20/2021 1339   EOSABS 0.0 12/18/2021 1207   BASOSABS 0.0 12/20/2021 1339   BASOSABS 0.0 12/18/2021 1207    Lab Results  Component Value Date   HGBA1C 5.6 11/29/2020    Assessment & Plan:  1. Essential hypertension Uncontrolled Amlodipine added to regimen - amLODipine (NORVASC) 5 MG tablet; Take 1 tablet (5 mg total) by mouth daily.  Dispense: 90 tablet; Refill: 1  2. Abdominal bloating In the setting of GERD Will need to evaluate for H. pylori Placed on PPI Advised to avoid recumbency up to 2 hours postmeal, avoid late meals, avoid foods that trigger symptoms. - omeprazole (PRILOSEC) 20 MG capsule; Take 1 capsule (20 mg total) by mouth 2 (two) times daily before a meal.  Dispense: 28 capsule; Refill: 0 - H. pylori breath test    Meds ordered this encounter  Medications   amLODipine (NORVASC) 5 MG tablet    Sig: Take 1 tablet (5 mg total) by mouth daily.    Dispense:  90 tablet    Refill:  1   omeprazole (PRILOSEC) 20 MG capsule    Sig: Take 1 capsule (20 mg total) by mouth 2 (two) times daily before a meal.    Dispense:  28 capsule    Refill:  0    Follow-up: Return in about 2 weeks (around 05/19/2022) for Blood Pressure follow-up.  Okay to double book.       Charlott Rakes, MD, FAAFP. [facility] and [facility],  [place]   05/05/2022, 10:13 AM

## 2022-05-05 NOTE — Progress Notes (Signed)
All medications needs to go to [facility].

## 2022-05-07 ENCOUNTER — Ambulatory Visit: Payer: Medicaid Other

## 2022-05-08 ENCOUNTER — Ambulatory Visit: Payer: Medicaid Other | Attending: Family Medicine

## 2022-05-10 LAB — H PYLORI BREATH TEST: H pylori Breath Test: NEGATIVE

## 2022-05-16 ENCOUNTER — Other Ambulatory Visit: Payer: Self-pay

## 2022-05-20 ENCOUNTER — Ambulatory Visit: Payer: Medicaid Other | Admitting: Family Medicine

## 2022-05-21 ENCOUNTER — Telehealth: Payer: Self-pay | Admitting: Family Medicine

## 2022-05-21 ENCOUNTER — Other Ambulatory Visit: Payer: Self-pay

## 2022-05-21 DIAGNOSIS — M1A0710 Idiopathic chronic gout, right ankle and foot, without tophus (tophi): Secondary | ICD-10-CM

## 2022-05-21 DIAGNOSIS — I25119 Atherosclerotic heart disease of native coronary artery with unspecified angina pectoris: Secondary | ICD-10-CM

## 2022-05-21 DIAGNOSIS — I10 Essential (primary) hypertension: Secondary | ICD-10-CM

## 2022-05-21 DIAGNOSIS — I129 Hypertensive chronic kidney disease with stage 1 through stage 4 chronic kidney disease, or unspecified chronic kidney disease: Secondary | ICD-10-CM

## 2022-05-21 NOTE — Telephone Encounter (Signed)
Patient is calling in because he requested to have his prescriptions sent to [facility], [place], and they are requesting that the office call them to verify information regarding the medications. The medications are allopurinol (ZYLOPRIM) 100 MG tablet [id number]  atorvastatin (LIPITOR) 40 MG tablet [id number] carvedilol (COREG) 12.5 MG tablet [id number] isosorbide mononitrate (IMDUR) 120 MG 24 hr tablet [id number]

## 2022-05-22 MED ORDER — ALLOPURINOL 100 MG PO TABS
50.0000 mg | ORAL_TABLET | ORAL | 6 refills | Status: AC
  Filled 2022-06-20: qty 15, 60d supply, fill #0
  Filled 2022-08-22 (×3): qty 15, 60d supply, fill #1
  Filled 2022-09-19 – 2022-10-17 (×2): qty 15, 60d supply, fill #2

## 2022-05-22 MED ORDER — ATORVASTATIN CALCIUM 40 MG PO TABS
40.0000 mg | ORAL_TABLET | Freq: Every day | ORAL | 1 refills | Status: AC
  Filled 2022-06-20: qty 90, 90d supply, fill #0
  Filled 2022-09-19: qty 90, 90d supply, fill #1

## 2022-05-22 MED ORDER — ISOSORBIDE MONONITRATE ER 120 MG PO TB24
120.0000 mg | ORAL_TABLET | Freq: Every day | ORAL | 1 refills | Status: AC
  Filled 2022-06-20: qty 90, 90d supply, fill #0
  Filled 2022-09-19: qty 90, 90d supply, fill #1

## 2022-05-22 MED ORDER — CARVEDILOL 12.5 MG PO TABS
12.5000 mg | ORAL_TABLET | Freq: Two times a day (BID) | ORAL | 1 refills | Status: AC
  Filled 2022-06-20: qty 180, 90d supply, fill #0
  Filled 2022-09-19: qty 180, 90d supply, fill #1

## 2022-05-22 NOTE — Addendum Note (Signed)
Addended by: Charlott Rakes on: 05/22/2022 01:29 PM   Modules accepted: Orders

## 2022-05-22 NOTE — Telephone Encounter (Signed)
Wanted to forward this to you before approval. I sent carvedilol, isosorbide, and atorvastatin. Pt's most recent eGFR was ~20. With her Gout and renal impairment, do we need to consider dose adjustment from 100 mg daily to '50mg'$  every two days?

## 2022-05-22 NOTE — Telephone Encounter (Signed)
Thanks.  I have sent a prescription for allopurinol 50 mg every other day to her pharmacy.

## 2022-06-20 ENCOUNTER — Other Ambulatory Visit: Payer: Self-pay

## 2022-06-20 ENCOUNTER — Other Ambulatory Visit: Payer: Self-pay | Admitting: Family Medicine

## 2022-06-20 DIAGNOSIS — I25119 Atherosclerotic heart disease of native coronary artery with unspecified angina pectoris: Secondary | ICD-10-CM

## 2022-06-20 DIAGNOSIS — N183 Chronic kidney disease, stage 3 unspecified: Secondary | ICD-10-CM

## 2022-06-20 DIAGNOSIS — M1A0710 Idiopathic chronic gout, right ankle and foot, without tophus (tophi): Secondary | ICD-10-CM

## 2022-06-20 DIAGNOSIS — I10 Essential (primary) hypertension: Secondary | ICD-10-CM

## 2022-06-20 NOTE — Telephone Encounter (Signed)
Unable to refill per protocol, Rx request is too soon. Last refill 05/22/22.  Requested Prescriptions  Pending Prescriptions Disp Refills   allopurinol (ZYLOPRIM) 100 MG tablet 30 tablet 6    Sig: Take 1 tablet (100 mg total) by mouth daily.     Endocrinology:  Gout Agents - allopurinol Failed - 06/20/2022 12:26 PM      Failed - Uric Acid in normal range and within 360 days    Uric Acid  Date Value Ref Range Status  08/29/2020 10.6 (H) 3.1 - 7.9 mg/dL Final    Comment:               Therapeutic target for gout patients: <6.0         Failed - Cr in normal range and within 360 days    Creatinine, Ser  Date Value Ref Range Status  04/02/2022 2.40 (H) 0.57 - 1.00 mg/dL Final         Passed - Valid encounter within last 12 months    Recent Outpatient Visits           1 month ago Essential hypertension   [place] [facility] [place], Odette Horns, MD   2 months ago Estrogen deficiency   [place] [facility] [place], Odette Horns, MD   5 months ago 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)   [place] [facility] [place], [place], MD   6 months ago Uterine prolapse   [place] [place], Odette Horns, MD   9 months ago 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)   [place] [facility] [place], Odette Horns, MD       Future Appointments             In 3 weeks Cordelia Pen, Joan Mayans, NP [place] [facility], [facility]            Passed - CBC within normal limits and completed in the last 12 months    WBC  Date Value Ref Range Status  12/22/2021 4.1 4.0 - 10.5 K/uL Final   RBC  Date Value Ref Range Status  12/22/2021 2.75 (L) 3.87 - 5.11 MIL/uL Final   Hemoglobin  Date Value Ref Range Status  12/22/2021 8.6 (L) 12.0 - 15.0 g/dL Final  16/12/9602 8.7 (L) 11.1 - 15.9 g/dL Final   HCT  Date  Value Ref Range Status  12/22/2021 25.0 (L) 36.0 - 46.0 % Final   Hematocrit  Date Value Ref Range Status  12/18/2021 24.2 (L) 34.0 - 46.6 % Final   MCHC  Date Value Ref Range Status  12/22/2021 34.4 30.0 - 36.0 g/dL Final   [facility]  Date Value Ref Range Status  12/22/2021 31.3 26.0 - 34.0 pg Final   MCV  Date Value Ref Range Status  12/22/2021 90.9 80.0 - 100.0 fL Final  12/18/2021 91 79 - 97 fL Final   No results found for: "PLTCOUNTKUC", "LABPLAT", "POCPLA" RDW  Date Value Ref Range Status  12/22/2021 12.8 11.5 - 15.5 % Final  12/18/2021 13.3 11.7 - 15.4 % Final          atorvastatin (LIPITOR) 40 MG tablet 30 tablet 6    Sig: TAKE 1 TABLET (40 MG TOTAL) BY MOUTH DAILY.     Cardiovascular:  Antilipid - Statins Failed - 06/20/2022 12:26 PM      Failed - Lipid  Panel in normal range within the last 12 months    Cholesterol, Total  Date Value Ref Range Status  09/22/2019 192 100 - 199 mg/dL Final   LDL Chol Calc (NIH)  Date Value Ref Range Status  09/22/2019 101 (H) 0 - 99 mg/dL Final   HDL  Date Value Ref Range Status  09/22/2019 54 >39 mg/dL Final   Triglycerides  Date Value Ref Range Status  09/22/2019 216 (H) 0 - 149 mg/dL Final         Passed - Patient is not pregnant      Passed - Valid encounter within last 12 months    Recent Outpatient Visits           1 month ago Essential hypertension   [place] [facility] [place], Odette HornsEn[place]ong, MD   2 months ago Estrogen deficiency   [place] [facility] [place], [place]   5 months ago Benign hypertension with CKD (chronic kidney disease) stage IV [facility](HCC)   [place] [facility] Hoy RegisterNewlin, Enobong, MD   6 months ago Uterine prolapse   [place] [facility] [place] [place], [place]   9 months ago Benign hypertension with CKD (chronic kidney disease) stage IV [facility](HCC)   [place] [facility] Hoy RegisterNewlin,  Enobong, MD       Future Appointments             In 3 weeks CondeZuleta, Joan MayansKaitlin G, NP [place] [facility]             carvedilol (COREG) 12.5 MG tablet 60 tablet 6    Sig: TAKE 1 TABLET (12.5 MG TOTAL) BY MOUTH 2 (TWO) TIMES DAILY WITH A MEAL.     Cardiovascular: Beta Blockers 3 Failed - 06/20/2022 12:26 PM      Failed - Cr in normal range and within 360 days    Creatinine, Ser  Date Value Ref Range Status  04/02/2022 2.40 (H) 0.57 - 1.00 mg/dL Final         Failed - Last BP in normal range    BP Readings from Last 1 Encounters:  05/05/22 (!) 150/70         Passed - AST in normal range and within 360 days    AST  Date Value Ref Range Status  04/02/2022 22 0 - 40 IU/L Final         Passed - ALT in normal range and within 360 days    ALT  Date Value Ref Range Status  04/02/2022 18 0 - 32 IU/L Final         Passed - Last Heart Rate in normal range    Pulse Readings from Last 1 Encounters:  05/05/22 69         Passed - Valid encounter within last 6 months    Recent Outpatient Visits           1 month ago Essential hypertension   [place] [facility] [place] Odette HornsEnobong, MD   2 months ago Estrogen deficiency   [place] [facility] [place] Odette HornsEnobong, MD   5 months ago Benign hypertension with CKD (chronic kidney disease) [facility]   [facility] [place] Register[place], En[place], MD   6 months ago Uterine prolapse   [place] [facility] [place] Register[place], En[place]ong, MD  9 months ago Benign hypertension with CKD (chronic kidney disease) stage IV [facility](HCC)   [place] [facility] [place], Odette HornsEnobong, MD       Future Appointments             In 3 weeks Cordelia PenZuleta, Joan MayansKaitlin G, NP [place] [facility]             hydrALAZINE (APRESOLINE) 100 MG tablet 90 tablet 6    Sig: Take 1 tablet (100 mg total)  by mouth 2 (two) times daily.     Cardiovascular:  Vasodilators Failed - 06/20/2022 12:26 PM      Failed - HCT in normal range and within 360 days    HCT  Date Value Ref Range Status  12/22/2021 25.0 (L) 36.0 - 46.0 % Final   Hematocrit  Date Value Ref Range Status  12/18/2021 24.2 (L) 34.0 - 46.6 % Final         Failed - HGB in normal range and within 360 days    Hemoglobin  Date Value Ref Range Status  12/22/2021 8.6 (L) 12.0 - 15.0 g/dL Final  16/10/960410/06/2021 8.7 (L) 11.1 - 15.9 g/dL Final         Failed - RBC in normal range and within 360 days    RBC  Date Value Ref Range Status  12/22/2021 2.75 (L) 3.87 - 5.11 MIL/uL Final         Failed - PLT in normal range and within 360 days    Platelets  Date Value Ref Range Status  12/22/2021 125 (L) 150 - 400 K/uL Final    Comment:    REPEATED TO VERIFY  12/18/2021 169 150 - 450 x10E3/uL Final         Failed - ANA Screen, Ifa, Serum in normal range and within 360 days    No results found for: "ANA", "ANATITER", "LABANTI"       Failed - Last BP in normal range    BP Readings from Last 1 Encounters:  05/05/22 (!) 150/70         Passed - WBC in normal range and within 360 days    WBC  Date Value Ref Range Status  12/22/2021 4.1 4.0 - 10.5 K/uL Final         Passed - Valid encounter within last 12 months    Recent Outpatient Visits           1 month ago Essential hypertension   [place] [facility] [place] Odette HornsEnobong, MD   2 months ago Estrogen deficiency   [place] [facility] [place] Odette HornsEnobong, MD   5 months ago Benign hypertension with CKD (chronic kidney disease) stage IV [facility](HCC)   [place] [facility] Hoy RegisterNewlin, Enobong, MD   6 months ago Uterine prolapse   [place] [facility] [place], [place], MD   9 months ago Benign hypertension with CKD (chronic kidney disease) stage IV [facility])   [place] [facility] [place], Odette HornsEnobong, MD       Future Appointments             In 3 weeks [place] DominionZuleta, Kaitlin G, NP  [facility] at [facility], [facility]             isosorbide mononitrate (IMDUR) 120 MG 24 hr tablet 30 tablet 3    Sig: Take 1 tablet (120  mg total) by mouth daily.     Cardiovascular:  Nitrates Failed - 06/20/2022 12:26 PM      Failed - Last BP in normal range    BP Readings from Last 1 Encounters:  05/05/22 (!) 150/70         Passed - Last Heart Rate in normal range    Pulse Readings from Last 1 Encounters:  05/05/22 69         Passed - Valid encounter within last 12 months    Recent Outpatient Visits           1 month ago Essential hypertension   [place] [facility] [place], Odette Horns, MD   2 months ago Estrogen deficiency   [place] [facility] [place], Odette Horns, MD   5 months ago Benign hypertension with CKD (chronic kidney disease) stage IV [facility])   [place] [facility] Hoy Register, MD   6 months ago Uterine prolapse   [place] [facility] [place], Odette Horns, MD   9 months ago Benign hypertension with CKD (chronic kidney disease) stage IV [facility])   [place] [facility] Hoy Register, MD       Future Appointments             In 3 weeks Cordelia Pen, Joan Mayans, NP [facility]

## 2022-06-27 ENCOUNTER — Other Ambulatory Visit: Payer: Self-pay

## 2022-07-14 NOTE — Progress Notes (Unsigned)
[place] [facility]   Subjective:     Chief Complaint: No chief complaint on file.  History of Present Illness: Danielle Houston is a 82 y.o. female with stage III pelvic organ prolapse who presents for a pessary check. She is using a size 2 1/2in short stem gellhorn pessary. The pessary has been working well and she has no complaints. She is using vaginal estrogen. She denies vaginal bleeding.  Past Medical History: Patient  has a past medical history of Coronary artery disease, Hypertension, and Hypothyroidism.   Past Surgical History: She  has a past surgical history that includes Cardiac catheterization and Coronary angioplasty.   Medications: She has a current medication list which includes the following prescription(s): alendronate, allopurinol, amlodipine, aspirin ec, atorvastatin, carvedilol, conjugated estrogens, cyanocobalamin, diclofenac sodium, hydralazine, isosorbide mononitrate, lidocaine, omeprazole, lokelma, and [DISCONTINUED] gabapentin.   Allergies: Patient has No Known Allergies.   Social History: Patient  reports that she has never smoked. She has never used smokeless tobacco. She reports that she does not drink alcohol and does not use drugs.      Objective:    Physical Exam: LMP  (LMP Unknown)  Gen: No apparent distress, A&O x 3. Detailed Urogynecologic Evaluation:  Pelvic Exam: Normal external female genitalia; Bartholin's and Skene's glands normal in appearance; urethral meatus {urethra:24773}, no urethral masses or discharge. The pessary was noted to be {in place:24774}. It was removed and cleaned. Speculum exam revealed {vaginal lesions:24775} in the vagina. The pessary was replaced. It was comfortable to the patient and fit well.       No data to display          Laboratory Results: Urine dipstick shows: {ua dip:315374::"negative for all components"}.    Assessment/Plan:    Assessment: Danielle Houston is a 82 y.o. with {PFD symptoms:24771} here for  a pessary check. She is doing well.  Plan: She will {pessary plan:24776}. She will continue to use {lubricant:24777}. She will follow-up in *** {days/wks/mos/yrs:310907} for a pessary check or sooner as needed.  All questions were answered.   Time Spent:

## 2022-07-15 ENCOUNTER — Encounter: Payer: Self-pay | Admitting: Obstetrics and Gynecology

## 2022-07-15 ENCOUNTER — Ambulatory Visit (INDEPENDENT_AMBULATORY_CARE_PROVIDER_SITE_OTHER): Payer: Medicaid Other | Admitting: Obstetrics and Gynecology

## 2022-07-15 VITALS — BP 197/76 | HR 73

## 2022-07-15 DIAGNOSIS — N812 Incomplete uterovaginal prolapse: Secondary | ICD-10-CM

## 2022-07-15 DIAGNOSIS — N3941 Urge incontinence: Secondary | ICD-10-CM | POA: Diagnosis not present

## 2022-07-15 DIAGNOSIS — N8110 Cystocele, unspecified: Secondary | ICD-10-CM

## 2022-07-15 DIAGNOSIS — I10 Essential (primary) hypertension: Secondary | ICD-10-CM | POA: Diagnosis not present

## 2022-07-15 DIAGNOSIS — N816 Rectocele: Secondary | ICD-10-CM

## 2022-07-15 DIAGNOSIS — Z4689 Encounter for fitting and adjustment of other specified devices: Secondary | ICD-10-CM | POA: Diagnosis not present

## 2022-07-15 NOTE — Patient Instructions (Signed)
Pumpkin seed extract can be useful for natural bladder control.

## 2022-07-31 ENCOUNTER — Ambulatory Visit: Payer: Medicaid Other | Admitting: Family Medicine

## 2022-08-22 ENCOUNTER — Other Ambulatory Visit: Payer: Self-pay

## 2022-08-22 ENCOUNTER — Other Ambulatory Visit (HOSPITAL_BASED_OUTPATIENT_CLINIC_OR_DEPARTMENT_OTHER): Payer: Self-pay

## 2022-09-19 ENCOUNTER — Other Ambulatory Visit: Payer: Self-pay

## 2022-10-07 ENCOUNTER — Ambulatory Visit: Payer: Medicaid Other | Admitting: Family Medicine

## 2022-10-21 ENCOUNTER — Ambulatory Visit: Payer: Medicaid Other | Admitting: Obstetrics and Gynecology

## 2022-10-21 ENCOUNTER — Other Ambulatory Visit: Payer: Self-pay

## 2022-11-02 ENCOUNTER — Emergency Department (HOSPITAL_COMMUNITY): Payer: Medicaid Other

## 2022-11-02 ENCOUNTER — Inpatient Hospital Stay (HOSPITAL_COMMUNITY)
Admission: EM | Admit: 2022-11-02 | Discharge: 2022-11-11 | DRG: 478 | Disposition: A | Discharge status: Skilled Nursing Facility | Payer: Medicaid Other | Attending: Internal Medicine | Admitting: Internal Medicine

## 2022-11-02 DIAGNOSIS — R339 Retention of urine, unspecified: Secondary | ICD-10-CM | POA: Diagnosis not present

## 2022-11-02 DIAGNOSIS — Z79899 Other long term (current) drug therapy: Secondary | ICD-10-CM

## 2022-11-02 DIAGNOSIS — Z9861 Coronary angioplasty status: Secondary | ICD-10-CM

## 2022-11-02 DIAGNOSIS — Y92009 Unspecified place in unspecified non-institutional (private) residence as the place of occurrence of the external cause: Secondary | ICD-10-CM

## 2022-11-02 DIAGNOSIS — D649 Anemia, unspecified: Secondary | ICD-10-CM | POA: Diagnosis present

## 2022-11-02 DIAGNOSIS — D631 Anemia in chronic kidney disease: Secondary | ICD-10-CM | POA: Diagnosis present

## 2022-11-02 DIAGNOSIS — E559 Vitamin D deficiency, unspecified: Secondary | ICD-10-CM | POA: Diagnosis present

## 2022-11-02 DIAGNOSIS — S32000A Wedge compression fracture of unspecified lumbar vertebra, initial encounter for closed fracture: Secondary | ICD-10-CM | POA: Diagnosis present

## 2022-11-02 DIAGNOSIS — K5900 Constipation, unspecified: Secondary | ICD-10-CM | POA: Diagnosis not present

## 2022-11-02 DIAGNOSIS — I10 Essential (primary) hypertension: Secondary | ICD-10-CM | POA: Diagnosis present

## 2022-11-02 DIAGNOSIS — Z7982 Long term (current) use of aspirin: Secondary | ICD-10-CM

## 2022-11-02 DIAGNOSIS — M542 Cervicalgia: Secondary | ICD-10-CM

## 2022-11-02 DIAGNOSIS — W1839XA Other fall on same level, initial encounter: Secondary | ICD-10-CM | POA: Diagnosis present

## 2022-11-02 DIAGNOSIS — E039 Hypothyroidism, unspecified: Secondary | ICD-10-CM | POA: Diagnosis present

## 2022-11-02 DIAGNOSIS — N184 Chronic kidney disease, stage 4 (severe): Secondary | ICD-10-CM | POA: Diagnosis present

## 2022-11-02 DIAGNOSIS — R112 Nausea with vomiting, unspecified: Secondary | ICD-10-CM | POA: Diagnosis present

## 2022-11-02 DIAGNOSIS — Z951 Presence of aortocoronary bypass graft: Secondary | ICD-10-CM

## 2022-11-02 DIAGNOSIS — R079 Chest pain, unspecified: Secondary | ICD-10-CM | POA: Diagnosis not present

## 2022-11-02 DIAGNOSIS — Z751 Person awaiting admission to adequate facility elsewhere: Secondary | ICD-10-CM

## 2022-11-02 DIAGNOSIS — M8588 Other specified disorders of bone density and structure, other site: Secondary | ICD-10-CM | POA: Diagnosis present

## 2022-11-02 DIAGNOSIS — N179 Acute kidney failure, unspecified: Secondary | ICD-10-CM | POA: Diagnosis not present

## 2022-11-02 DIAGNOSIS — T402X5A Adverse effect of other opioids, initial encounter: Secondary | ICD-10-CM | POA: Diagnosis present

## 2022-11-02 DIAGNOSIS — I129 Hypertensive chronic kidney disease with stage 1 through stage 4 chronic kidney disease, or unspecified chronic kidney disease: Secondary | ICD-10-CM | POA: Diagnosis present

## 2022-11-02 DIAGNOSIS — W19XXXA Unspecified fall, initial encounter: Principal | ICD-10-CM

## 2022-11-02 DIAGNOSIS — S32020A Wedge compression fracture of second lumbar vertebra, initial encounter for closed fracture: Principal | ICD-10-CM

## 2022-11-02 DIAGNOSIS — Z603 Acculturation difficulty: Secondary | ICD-10-CM | POA: Diagnosis present

## 2022-11-02 DIAGNOSIS — N189 Chronic kidney disease, unspecified: Secondary | ICD-10-CM | POA: Diagnosis present

## 2022-11-02 DIAGNOSIS — M48061 Spinal stenosis, lumbar region without neurogenic claudication: Secondary | ICD-10-CM | POA: Diagnosis present

## 2022-11-02 DIAGNOSIS — Z7983 Long term (current) use of bisphosphonates: Secondary | ICD-10-CM

## 2022-11-02 DIAGNOSIS — I2510 Atherosclerotic heart disease of native coronary artery without angina pectoris: Secondary | ICD-10-CM | POA: Diagnosis present

## 2022-11-02 DIAGNOSIS — Z7902 Long term (current) use of antithrombotics/antiplatelets: Secondary | ICD-10-CM

## 2022-11-02 LAB — CBC WITH DIFFERENTIAL/PLATELET
Abs Immature Granulocytes: 0.02 10*3/uL (ref 0.00–0.07)
Basophils Absolute: 0 10*3/uL (ref 0.0–0.1)
Basophils Relative: 0 %
Eosinophils Absolute: 0.1 10*3/uL (ref 0.0–0.5)
Eosinophils Relative: 1 %
HCT: 26.6 % — ABNORMAL LOW (ref 36.0–46.0)
Hemoglobin: 8.6 g/dL — ABNORMAL LOW (ref 12.0–15.0)
Immature Granulocytes: 0 %
Lymphocytes Relative: 15 %
Lymphs Abs: 0.9 10*3/uL (ref 0.7–4.0)
MCH: 31 pg (ref 26.0–34.0)
MCHC: 32.3 g/dL (ref 30.0–36.0)
MCV: 96 fL (ref 80.0–100.0)
Monocytes Absolute: 0.5 10*3/uL (ref 0.1–1.0)
Monocytes Relative: 8 %
Neutro Abs: 4.9 10*3/uL (ref 1.7–7.7)
Neutrophils Relative %: 76 %
Platelets: 144 10*3/uL — ABNORMAL LOW (ref 150–400)
RBC: 2.77 MIL/uL — ABNORMAL LOW (ref 3.87–5.11)
RDW: 12 % (ref 11.5–15.5)
WBC: 6.5 10*3/uL (ref 4.0–10.5)
nRBC: 0 % (ref 0.0–0.2)

## 2022-11-02 LAB — COMPREHENSIVE METABOLIC PANEL
ALT: 14 U/L (ref 0–44)
AST: 17 U/L (ref 15–41)
Albumin: 3.5 g/dL (ref 3.5–5.0)
Alkaline Phosphatase: 86 U/L (ref 38–126)
Anion gap: 7 (ref 5–15)
BUN: 59 mg/dL — ABNORMAL HIGH (ref 8–23)
CO2: 20 mmol/L — ABNORMAL LOW (ref 22–32)
Calcium: 8.4 mg/dL — ABNORMAL LOW (ref 8.9–10.3)
Chloride: 111 mmol/L (ref 98–111)
Creatinine, Ser: 2.18 mg/dL — ABNORMAL HIGH (ref 0.44–1.00)
GFR, Estimated: 22 mL/min — ABNORMAL LOW (ref >60)
Glucose, Bld: 122 mg/dL — ABNORMAL HIGH (ref 70–99)
Potassium: 4.4 mmol/L (ref 3.5–5.1)
Sodium: 138 mmol/L (ref 135–145)
Total Bilirubin: 0.8 mg/dL (ref 0.3–1.2)
Total Protein: 7.3 g/dL (ref 6.5–8.1)

## 2022-11-02 LAB — TROPONIN I (HIGH SENSITIVITY): Troponin I (High Sensitivity): 11 ng/L (ref <18)

## 2022-11-02 MED ORDER — MORPHINE SULFATE (PF) 4 MG/ML IV SOLN
4.0000 mg | Freq: Once | INTRAVENOUS | Status: AC
  Administered 2022-11-02: 4 mg via INTRAVENOUS
  Filled 2022-11-02: qty 1

## 2022-11-02 NOTE — ED Triage Notes (Signed)
X 4 days ago, since pt has been having increased sacral pain, pt will not ambulate because of pain, - LOC or hitting head. Pt also has had decreased app.& weakness

## 2022-11-02 NOTE — ED Provider Notes (Signed)
[place] EMERGENCY DEPARTMENT AT [facility] Provider Note   CSN: [id number] Arrival date & time: 11/02/22  2208     History {Add pertinent medical, surgical, social history, OB history to HPI:1} Chief Complaint  Patient presents with   Katheen Houston is a 82 y.o. female.  Level 5 caveat for language barrier.  Translator is used.  Granddaughter reports patient had a fall while working outside 4 days ago.  She picked up something and fell backwards onto her low back and buttocks.  Did not hit her head.  Since then she has had intractable low back pain and not able to stand or ambulate.  Has been lying in bed for the past 4 days.  She takes Plavix and aspirin but no other blood thinners.  Does not think she hit her head.  Pain is to her low back mostly on the right side in the middle.  No ration of the pain down her buttocks or down her legs.  No focal weakness, numbness or tingling.  No bowel or bladder incontinence.  No chest pain or shortness of breath.  No abdominal pain.  Decreased appetite and generalized weakness as well with poor p.o. intake at home by report.  Past medical history includes CAD, hypertension, hypothyroidism  The history is provided by the patient. The history is limited by a language barrier. A language interpreter was used.  Fall Pertinent negatives include no abdominal pain, no headaches and no shortness of breath.       Home Medications Prior to Admission medications   Medication Sig Start Date End Date Taking? Authorizing Provider  alendronate (FOSAMAX) 70 MG tablet Take 1 tablet (70 mg total) by mouth every 7 (seven) days. Take with a full glass of water on an empty stomach. 04/25/22   Hoy Register, MD  allopurinol (ZYLOPRIM) 100 MG tablet Take 0.5 tablets (50 mg total) by mouth every other day. 05/22/22   Hoy Register, MD  amLODipine (NORVASC) 5 MG tablet Take 1 tablet (5 mg total) by mouth daily. 05/05/22   Hoy Register, MD  aspirin  EC 81 MG tablet Take 1 tablet (81 mg total) by mouth daily. Swallow whole. 10/26/19   Hoy Register, MD  atorvastatin (LIPITOR) 40 MG tablet TAKE 1 TABLET (40 MG TOTAL) BY MOUTH DAILY. 05/22/22   Hoy Register, MD  carvedilol (COREG) 12.5 MG tablet TAKE 1 TABLET (12.5 MG TOTAL) BY MOUTH 2 (TWO) TIMES DAILY WITH A MEAL. 05/22/22   Hoy Register, MD  conjugated estrogens (PREMARIN) vaginal cream Place 0.5 g vaginally nightly for two weeks then twice a week thereafter 04/21/22   Selmer Dominion, NP  cyanocobalamin (VITAMIN B12) 1000 MCG tablet Take 1 tablet (1,000 mcg total) by mouth daily. 12/22/21   Elgergawy, Leana Roe, MD  diclofenac Sodium (VOLTAREN) 1 % GEL APPLY 2 Grams TOPICALLY 4 (FOUR) TIMES DAILY as needed for PAIN 04/02/22   Hoy Register, MD  hydrALAZINE (APRESOLINE) 100 MG tablet Take 1 tablet (100 mg total) by mouth 2 (two) times daily. 04/02/22   Hoy Register, MD  isosorbide mononitrate (IMDUR) 120 MG 24 hr tablet Take 1 tablet (120 mg total) by mouth daily. 05/22/22   Hoy Register, MD  lidocaine (LIDODERM) 5 % Place 1 patch onto the skin daily. Remove & Discard patch within 12 hours or as directed by MD 04/02/22   Hoy Register, MD  omeprazole (PRILOSEC) 20 MG capsule Take 1 capsule (20 mg total) by mouth 2 (  two) times daily before a meal. 05/05/22   Hoy Register, MD  sodium zirconium cyclosilicate (LOKELMA) 5 g packet Take 10 g by mouth daily. 04/03/22   Hoy Register, MD  gabapentin (NEURONTIN) 300 MG capsule Take 1 capsule (300 mg total) by mouth at bedtime. 01/23/20 04/24/20  Hoy Register, MD      Allergies    Patient has no known allergies.    Review of Systems   Review of Systems  Constitutional:  Negative for activity change, appetite change and fever.  HENT:  Negative for congestion and rhinorrhea.   Respiratory:  Negative for cough, chest tightness and shortness of breath.   Gastrointestinal:  Negative for abdominal pain, nausea and vomiting.  Musculoskeletal:   Positive for arthralgias, back pain and myalgias.  Neurological:  Positive for weakness. Negative for dizziness and headaches.   all other systems are negative except as noted in the HPI and PMH.    Physical Exam Updated Vital Signs BP (!) 162/68 (BP Location: Right Arm)   Pulse 67   Temp 98.3 F (36.8 C) (Oral)   Resp 19   LMP  (LMP Unknown)   SpO2 95%  Physical Exam Vitals and nursing note reviewed.  Constitutional:      General: She is not in acute distress.    Appearance: She is well-developed.  HENT:     Head: Normocephalic and atraumatic.     Mouth/Throat:     Pharynx: No oropharyngeal exudate.  Eyes:     Conjunctiva/sclera: Conjunctivae normal.     Pupils: Pupils are equal, round, and reactive to light.  Neck:     Comments: No meningismus. No midline C spine pain Cardiovascular:     Rate and Rhythm: Normal rate and regular rhythm.     Heart sounds: Normal heart sounds. No murmur heard. Pulmonary:     Effort: Pulmonary effort is normal. No respiratory distress.     Breath sounds: Normal breath sounds.  Abdominal:     Palpations: Abdomen is soft.     Tenderness: There is no abdominal tenderness. There is no guarding or rebound.  Musculoskeletal:        General: Tenderness present. Normal range of motion.     Cervical back: Normal range of motion and neck supple.     Comments: TTP lumbar and paraspinal on right.  5/5 strength in lower extremities. Able to lift legs off bed bilaterally  Skin:    General: Skin is warm.  Neurological:     Mental Status: She is alert and oriented to person, place, and time.     Cranial Nerves: No cranial nerve deficit.     Motor: No abnormal muscle tone.     Coordination: Coordination normal.     Comments:  5/5 strength throughout. CN 2-12 intact.Equal grip strength.   Psychiatric:        Behavior: Behavior normal.     ED Results / Procedures / Treatments   Labs (all labs ordered are listed, but only abnormal results are  displayed) Labs Reviewed  CBC WITH DIFFERENTIAL/PLATELET  COMPREHENSIVE METABOLIC PANEL  URINALYSIS, ROUTINE W REFLEX MICROSCOPIC  TROPONIN I (HIGH SENSITIVITY)    EKG None  Radiology No results found.  Procedures Procedures  {Document cardiac monitor, telemetry assessment procedure when appropriate:1}  Medications Ordered in ED Medications  morphine (PF) 4 MG/ML injection 4 mg (has no administration in time range)    ED Course/ Medical Decision Making/ A&P   {   Click here for ABCD2,  HEART and other calculatorsREFRESH Note before signing :1}                              Medical Decision Making Amount and/or Complexity of Data Reviewed Labs: ordered. Decision-making details documented in ED Course. Radiology: ordered and independent interpretation performed. Decision-making details documented in ED Course. ECG/medicine tests: ordered and independent interpretation performed. Decision-making details documented in ED Course.  Risk Prescription drug management.   Fall 4 days ago with low back pain and unable to ambulate since fall. Equal strength in lower extremities  {Document critical care time when appropriate:1} {Document review of labs and clinical decision tools ie heart score, Chads2Vasc2 etc:1}  {Document your independent review of radiology images, and any outside records:1} {Document your discussion with family members, caretakers, and with consultants:1} {Document social determinants of health affecting pt's care:1} {Document your decision making why or why not admission, treatments were needed:1} Final Clinical Impression(s) / ED Diagnoses Final diagnoses:  None    Rx / DC Orders ED Discharge Orders     None

## 2022-11-03 ENCOUNTER — Other Ambulatory Visit: Payer: Self-pay

## 2022-11-03 ENCOUNTER — Encounter (HOSPITAL_COMMUNITY): Payer: Self-pay | Admitting: Internal Medicine

## 2022-11-03 ENCOUNTER — Emergency Department (HOSPITAL_COMMUNITY): Payer: Medicaid Other

## 2022-11-03 ENCOUNTER — Observation Stay (HOSPITAL_COMMUNITY): Payer: Medicaid Other

## 2022-11-03 DIAGNOSIS — I129 Hypertensive chronic kidney disease with stage 1 through stage 4 chronic kidney disease, or unspecified chronic kidney disease: Secondary | ICD-10-CM

## 2022-11-03 DIAGNOSIS — E039 Hypothyroidism, unspecified: Secondary | ICD-10-CM

## 2022-11-03 DIAGNOSIS — N184 Chronic kidney disease, stage 4 (severe): Secondary | ICD-10-CM

## 2022-11-03 DIAGNOSIS — W19XXXA Unspecified fall, initial encounter: Secondary | ICD-10-CM

## 2022-11-03 DIAGNOSIS — R112 Nausea with vomiting, unspecified: Secondary | ICD-10-CM | POA: Diagnosis present

## 2022-11-03 DIAGNOSIS — D649 Anemia, unspecified: Secondary | ICD-10-CM

## 2022-11-03 DIAGNOSIS — S32000A Wedge compression fracture of unspecified lumbar vertebra, initial encounter for closed fracture: Secondary | ICD-10-CM | POA: Diagnosis present

## 2022-11-03 DIAGNOSIS — I10 Essential (primary) hypertension: Secondary | ICD-10-CM

## 2022-11-03 DIAGNOSIS — S32020A Wedge compression fracture of second lumbar vertebra, initial encounter for closed fracture: Secondary | ICD-10-CM | POA: Diagnosis present

## 2022-11-03 LAB — URINALYSIS, ROUTINE W REFLEX MICROSCOPIC
Bilirubin Urine: NEGATIVE
Glucose, UA: NEGATIVE mg/dL
Hgb urine dipstick: NEGATIVE
Ketones, ur: NEGATIVE mg/dL
Nitrite: NEGATIVE
Protein, ur: NEGATIVE mg/dL
Specific Gravity, Urine: 1.015 (ref 1.005–1.030)
pH: 5 (ref 5.0–8.0)

## 2022-11-03 LAB — CBC
HCT: 26.1 % — ABNORMAL LOW (ref 36.0–46.0)
Hemoglobin: 8.4 g/dL — ABNORMAL LOW (ref 12.0–15.0)
MCH: 31 pg (ref 26.0–34.0)
MCHC: 32.2 g/dL (ref 30.0–36.0)
MCV: 96.3 fL (ref 80.0–100.0)
Platelets: 143 10*3/uL — ABNORMAL LOW (ref 150–400)
RBC: 2.71 MIL/uL — ABNORMAL LOW (ref 3.87–5.11)
RDW: 12.1 % (ref 11.5–15.5)
WBC: 6.5 10*3/uL (ref 4.0–10.5)
nRBC: 0 % (ref 0.0–0.2)

## 2022-11-03 LAB — BASIC METABOLIC PANEL
Anion gap: 9 (ref 5–15)
BUN: 58 mg/dL — ABNORMAL HIGH (ref 8–23)
CO2: 20 mmol/L — ABNORMAL LOW (ref 22–32)
Calcium: 8.2 mg/dL — ABNORMAL LOW (ref 8.9–10.3)
Chloride: 108 mmol/L (ref 98–111)
Creatinine, Ser: 2.13 mg/dL — ABNORMAL HIGH (ref 0.44–1.00)
GFR, Estimated: 23 mL/min — ABNORMAL LOW (ref >60)
Glucose, Bld: 107 mg/dL — ABNORMAL HIGH (ref 70–99)
Potassium: 4.5 mmol/L (ref 3.5–5.1)
Sodium: 137 mmol/L (ref 135–145)

## 2022-11-03 LAB — TROPONIN I (HIGH SENSITIVITY): Troponin I (High Sensitivity): 11 ng/L (ref <18)

## 2022-11-03 MED ORDER — ONDANSETRON HCL 4 MG/2ML IJ SOLN
INTRAMUSCULAR | Status: AC
  Administered 2022-11-03: 4 mg via INTRAVENOUS
  Filled 2022-11-03: qty 2

## 2022-11-03 MED ORDER — HYDRALAZINE HCL 50 MG PO TABS
100.0000 mg | ORAL_TABLET | Freq: Two times a day (BID) | ORAL | Status: DC
  Administered 2022-11-03 – 2022-11-11 (×13): 100 mg via ORAL
  Filled 2022-11-03 (×14): qty 2

## 2022-11-03 MED ORDER — LACTATED RINGERS IV SOLN: INTRAVENOUS | Status: DC

## 2022-11-03 MED ORDER — AMLODIPINE BESYLATE 5 MG PO TABS
5.0000 mg | ORAL_TABLET | Freq: Every day | ORAL | Status: DC
  Administered 2022-11-03 – 2022-11-04 (×2): 5 mg via ORAL
  Filled 2022-11-03 (×2): qty 1

## 2022-11-03 MED ORDER — CARVEDILOL 12.5 MG PO TABS
12.5000 mg | ORAL_TABLET | Freq: Two times a day (BID) | ORAL | Status: DC
  Administered 2022-11-03 – 2022-11-11 (×14): 12.5 mg via ORAL
  Filled 2022-11-03 (×14): qty 1

## 2022-11-03 MED ORDER — PANTOPRAZOLE SODIUM 40 MG PO TBEC
40.0000 mg | DELAYED_RELEASE_TABLET | Freq: Every day | ORAL | Status: DC
  Administered 2022-11-03 – 2022-11-10 (×7): 40 mg via ORAL
  Filled 2022-11-03 (×7): qty 1

## 2022-11-03 MED ORDER — SODIUM CHLORIDE 0.9 % IV SOLN
12.5000 mg | Freq: Once | INTRAVENOUS | Status: AC
  Administered 2022-11-03: 12.5 mg via INTRAVENOUS
  Filled 2022-11-03: qty 12.5

## 2022-11-03 MED ORDER — ONDANSETRON HCL 4 MG/2ML IJ SOLN: 4.0000 mg | Freq: Four times a day (QID) | INTRAMUSCULAR | Status: DC | PRN

## 2022-11-03 MED ORDER — FENTANYL CITRATE PF 50 MCG/ML IJ SOSY
25.0000 ug | PREFILLED_SYRINGE | INTRAMUSCULAR | Status: DC | PRN
  Administered 2022-11-04 (×5): 25 ug via INTRAVENOUS
  Filled 2022-11-03 (×5): qty 1

## 2022-11-03 MED ORDER — ONDANSETRON HCL 4 MG/2ML IJ SOLN: 4.0000 mg | Freq: Once | INTRAMUSCULAR | Status: AC

## 2022-11-03 MED ORDER — KETOROLAC TROMETHAMINE 30 MG/ML IJ SOLN
15.0000 mg | Freq: Once | INTRAMUSCULAR | Status: AC
  Administered 2022-11-03: 15 mg via INTRAVENOUS
  Filled 2022-11-03: qty 1

## 2022-11-03 MED ORDER — ACETAMINOPHEN 325 MG PO TABS
650.0000 mg | ORAL_TABLET | Freq: Four times a day (QID) | ORAL | Status: DC | PRN
  Administered 2022-11-04 – 2022-11-08 (×4): 650 mg via ORAL
  Filled 2022-11-03 (×4): qty 2

## 2022-11-03 MED ORDER — ENOXAPARIN SODIUM 30 MG/0.3ML IJ SOSY
30.0000 mg | PREFILLED_SYRINGE | INTRAMUSCULAR | Status: DC
  Administered 2022-11-03 – 2022-11-04 (×2): 30 mg via SUBCUTANEOUS
  Filled 2022-11-03 (×2): qty 0.3

## 2022-11-03 MED ORDER — ACETAMINOPHEN 650 MG RE SUPP: 650.0000 mg | Freq: Four times a day (QID) | RECTAL | Status: DC | PRN

## 2022-11-03 MED ORDER — ONDANSETRON HCL 4 MG/2ML IJ SOLN
4.0000 mg | Freq: Once | INTRAMUSCULAR | Status: AC
  Administered 2022-11-03: 4 mg via INTRAVENOUS
  Filled 2022-11-03: qty 2

## 2022-11-03 MED ORDER — HYDRALAZINE HCL 20 MG/ML IJ SOLN
10.0000 mg | Freq: Four times a day (QID) | INTRAMUSCULAR | Status: DC | PRN
  Administered 2022-11-06: 10 mg via INTRAVENOUS
  Filled 2022-11-03: qty 1

## 2022-11-03 NOTE — Assessment & Plan Note (Signed)
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  HGB of 8.6 today is unchanged compared to October of last year (also 8.6).

## 2022-11-03 NOTE — Assessment & Plan Note (Signed)
BP currently 108/50. Home med rec is pending still.

## 2022-11-03 NOTE — Progress Notes (Signed)
PT Cancellation Note  Patient Details Name: Danielle Houston MRN: [id number] DOB: 1940-06-20   Cancelled Treatment:    Reason Eval/Treat Not Completed: Other (comment);Patient not medically ready Nurse said that family would be in later today to help with determining patients appropriate dialect for interpretation and patient was NPO at this time pending possible procedure. Pending TLSO brace as well. PT to continue to follow   [facility] 11/03/2022, 2:13 PM

## 2022-11-03 NOTE — Assessment & Plan Note (Signed)
Med rec pending. Doesn't look like she's on any thyroid replacement therapy.

## 2022-11-03 NOTE — ED Notes (Signed)
.ED TO INPATIENT HANDOFF REPORT  Name/Age/Gender Danielle Houston 82 y.o. female  Code Status    Code Status Orders  (From admission, onward)           Start     Ordered   11/03/22 0316  Full code  Continuous       Question:  By:  Answer:  Default: patient does not have capacity for decision making, no surrogate or prior directive available   11/03/22 0317           Code Status History     Date Active Date Inactive Code Status Order ID Comments User Context   12/20/2021 1810 12/22/2021 2002 Full Code [id number]  Emeline General, MD ED   08/03/2019 2353 08/04/2019 2004 Full Code [id number]  Eduard Clos, MD ED       Home/SNF/Other Home  Chief Complaint Compression fracture of lumbar vertebra (HCC) [S32.000A]  Level of Care/Admitting Diagnosis ED Disposition     ED Disposition  Admit   Condition  --   Comment  Hospital Area: [facility] [[id number]]  Level of Care: Med-Surg [16]  May place patient in observation at [facility] or [facility] if equivalent level of care is available:: No  Covid Evaluation: Asymptomatic - no recent exposure (last 10 days) testing not required  Diagnosis: Compression fracture of lumbar vertebra [facility]) [[id number]]  Admitting Physician: Hillary Bow [phone]  Attending Physician: Hillary Bow [phone]          Medical History Past Medical History:  Diagnosis Date   Coronary artery disease    Hypertension    Hypothyroidism     Allergies No Known Allergies  IV Location/Drains/Wounds Patient Lines/Drains/Airways Status     Active Line/Drains/Airways     Name Placement date Placement time Site Days   Peripheral IV 11/02/22 20 G Anterior;Proximal;Right Forearm 11/02/22  2313  Forearm  1            Labs/Imaging Results for orders placed or performed during the hospital encounter of 11/02/22 (from the past 48 hour(s))  CBC with Differential     Status: Abnormal   Collection Time: 11/02/22 10:27 PM   Result Value Ref Range   WBC 6.5 4.0 - 10.5 K/uL   RBC 2.77 (L) 3.87 - 5.11 MIL/uL   Hemoglobin 8.6 (L) 12.0 - 15.0 g/dL   HCT 84.6 (L) 96.2 - 95.2 %   MCV 96.0 80.0 - 100.0 fL   MCH 31.0 26.0 - 34.0 pg   MCHC 32.3 30.0 - 36.0 g/dL   RDW 84.1 32.4 - 40.1 %   Platelets 144 (L) 150 - 400 K/uL   nRBC 0.0 0.0 - 0.2 %   Neutrophils Relative % 76 %   Neutro Abs 4.9 1.7 - 7.7 K/uL   Lymphocytes Relative 15 %   Lymphs Abs 0.9 0.7 - 4.0 K/uL   Monocytes Relative 8 %   Monocytes Absolute 0.5 0.1 - 1.0 K/uL   Eosinophils Relative 1 %   Eosinophils Absolute 0.1 0.0 - 0.5 K/uL   Basophils Relative 0 %   Basophils Absolute 0.0 0.0 - 0.1 K/uL   Immature Granulocytes 0 %   Abs Immature Granulocytes 0.02 0.00 - 0.07 K/uL    Comment: Performed at [facility], [address], [place], [place] [address]  Comprehensive metabolic panel     Status: Abnormal   Collection Time: 11/02/22 10:27 PM  Result Value Ref Range   Sodium 138 135 -  145 mmol/L   Potassium 4.4 3.5 - 5.1 mmol/L   Chloride 111 98 - 111 mmol/L   CO2 20 (L) 22 - 32 mmol/L   Glucose, Bld 122 (H) 70 - 99 mg/dL    Comment: Glucose reference range applies only to samples taken after fasting for at least 8 hours.   BUN 59 (H) 8 - 23 mg/dL   Creatinine, Ser 9.56 (H) 0.44 - 1.00 mg/dL   Calcium 8.4 (L) 8.9 - 10.3 mg/dL   Total Protein 7.3 6.5 - 8.1 g/dL   Albumin 3.5 3.5 - 5.0 g/dL   AST 17 15 - 41 U/L   ALT 14 0 - 44 U/L   Alkaline Phosphatase 86 38 - 126 U/L   Total Bilirubin 0.8 0.3 - 1.2 mg/dL   GFR, Estimated 22 (L) >60 mL/min    Comment: (NOTE) Calculated using the CKD-EPI Creatinine Equation (2021)    Anion gap 7 5 - 15    Comment: Performed at [facility], [address], [place], [place] [address]  Troponin I (High Sensitivity)     Status: None   Collection Time: 11/02/22 10:27 PM  Result Value Ref Range   Troponin I (High Sensitivity) 11 <18 ng/L    Comment: (NOTE) Elevated  high sensitivity troponin I (hsTnI) values and significant  changes across serial measurements may suggest ACS but many other  chronic and acute conditions are known to elevate hsTnI results.  Refer to the "Links" section for chest pain algorithms and additional  guidance. Performed at [facility], [address] [place], [place] [address]   Urinalysis, Routine w reflex microscopic -Urine, Clean Catch     Status: Abnormal   Collection Time: 11/03/22  1:44 AM  Result Value Ref Range   Color, Urine YELLOW YELLOW   APPearance HAZY (A) CLEAR   Specific Gravity, Urine 1.015 1.005 - 1.030   pH 5.0 5.0 - 8.0   Glucose, UA NEGATIVE NEGATIVE mg/dL   Hgb urine dipstick NEGATIVE NEGATIVE   Bilirubin Urine NEGATIVE NEGATIVE   Ketones, ur NEGATIVE NEGATIVE mg/dL   Protein, ur NEGATIVE NEGATIVE mg/dL   Nitrite NEGATIVE NEGATIVE   Leukocytes,Ua TRACE (A) NEGATIVE   RBC / HPF 0-5 0 - 5 RBC/hpf   WBC, UA 0-5 0 - 5 WBC/hpf   Bacteria, UA RARE (A) NONE SEEN   Squamous Epithelial / HPF 0-5 0 - 5 /HPF   Mucus PRESENT     Comment: Performed at [facility], [address] [place], [place] [address]  Troponin I (High Sensitivity)     Status: None   Collection Time: 11/03/22  1:55 AM  Result Value Ref Range   Troponin I (High Sensitivity) 11 <18 ng/L    Comment: (NOTE) Elevated high sensitivity troponin I (hsTnI) values and significant  changes across serial measurements may suggest ACS but many other  chronic and acute conditions are known to elevate hsTnI results.  Refer to the "Links" section for chest pain algorithms and additional  guidance. Performed at [facility], [address], [place], [place] [address]    DG Abd Portable 2 Views  Result Date: 11/03/2022 CLINICAL DATA:  Vomiting EXAM: PORTABLE ABDOMEN - 2 VIEW COMPARISON:  Same day CT pelvis FINDINGS: The bowel gas pattern is normal. Moderate colonic stool load. There is no  evidence of free air. Redemonstrated compression fracture of L2 as seen on CT 10/23/2022. IMPRESSION: No acute radiographic abnormality in the abdomen.  Acute or subacute compression fracture of L2. Electronically Signed   By: Minerva Fester M.D.   On: 11/03/2022 03:20   DG Chest Portable 1 View  Result Date: 11/03/2022 CLINICAL DATA:  Vomiting EXAM: PORTABLE CHEST 1 VIEW COMPARISON:  Chest radiograph 12/20/2021 FINDINGS: Stable cardiomediastinal silhouette. Aortic atherosclerotic calcification. Bibasilar atelectasis. No focal consolidation, pleural effusion, or pneumothorax. No displaced rib fractures. IMPRESSION: No active disease. Electronically Signed   By: Minerva Fester M.D.   On: 11/03/2022 03:18   CT Lumbar Spine Wo Contrast  Addendum Date: 11/03/2022   ADDENDUM REPORT: 11/03/2022 00:00 ADDENDUM: These results were called by telephone at the time of interpretation on 11/02/2022 at 11:59 pm to provider [facility] , who verbally acknowledged these results. Electronically Signed   By: Helyn Numbers M.D.   On: 11/03/2022 00:00   Result Date: 11/03/2022 CLINICAL DATA:  Back trauma, no prior imaging (Age >= 16y), lumbosacral pain, unable to ambulate. EXAM: CT LUMBAR SPINE WITHOUT CONTRAST TECHNIQUE: Multidetector CT imaging of the lumbar spine was performed without intravenous contrast administration. Multiplanar CT image reconstructions were also generated. RADIATION DOSE REDUCTION: This exam was performed according to the departmental dose-optimization program which includes automated exposure control, adjustment of the mA and/or kV according to patient size and/or use of iterative reconstruction technique. COMPARISON:  None Available. FINDINGS: Segmentation: 5 lumbar type vertebrae. Alignment: Mild straightening of the lumbar spine.  No listhesis. Vertebrae: The osseous structures are diffusely osteopenic. There is an acute to subacute compression fracture of L2 with approximately 50% loss of  height. The fracture plane is seen extending obliquely in the coronal plane and involving the anterior, inferior, and superior vertebral cortices. The posterior cortex appears intact though there is minimal retropulsion of the posterosuperior aspect of the L2 vertebral body by approximately 3 mm. Posterior elements appear intact. Remaining vertebral body height appears preserved. Paraspinal and other soft tissues: Mild paravertebral infiltration noted at L2 in keeping with a trace amount of interstitial hemorrhage or edema. No paraspinal fluid collections. Atherosclerotic calcification noted within the abdominal aorta. Disc levels: Mild retropulsion of the posterosuperior aspect of L2 results in minimal canal stenosis but no significant neuroforaminal narrowing. Broad-based posterior disc bulge at L4-5 in combination with mild facet arthrosis and laminar hypertrophy results in mild central canal stenosis but no significant neuroforaminal narrowing. No high-grade canal stenosis or neuroforaminal narrowing identified. IMPRESSION: 1. Acute to subacute compression fracture of L2 with approximately 50% loss of height. Minimal retropulsion of the posterosuperior aspect of the L2 vertebral body by approximately 3 mm results in minimal canal stenosis but no significant neuroforaminal narrowing. 2. Diffuse osteopenia. 3. 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 4. Aortic atherosclerosis. Aortic Atherosclerosis (ICD10-I70.0). Electronically Signed: By: Helyn Numbers M.D. On: 11/02/2022 23:51   CT PELVIS WO CONTRAST  Result Date: 11/02/2022 CLINICAL DATA:  Hip trauma, fracture suspected. Increasing sacral pain and unwillingness to ambulate. EXAM: CT PELVIS WITHOUT CONTRAST TECHNIQUE: Multidetector CT imaging of the pelvis was performed following the standard protocol without intravenous contrast. RADIATION DOSE REDUCTION: This exam was performed according to  the departmental dose-optimization program which includes automated exposure control, adjustment of the mA and/or kV according to patient size and/or use of iterative reconstruction technique. COMPARISON:  None Available. FINDINGS: Urinary Tract:  No abnormality visualized. Bowel:  Unremarkable visualized pelvic bowel loops. Vascular/Lymphatic: Aortic atherosclerotic calcification. No lymphadenopathy. Reproductive:  Pessary.  No acute abnormality. Other:  None. Musculoskeletal: Demineralization. No  acute fracture or dislocation. Degenerative changes both hips, pubic symphysis, and SI joints. IMPRESSION: 1. No acute fracture or dislocation. Aortic Atherosclerosis (ICD10-I70.0). Electronically Signed   By: Minerva Fester M.D.   On: 11/02/2022 23:45   CT Head Wo Contrast  Result Date: 11/02/2022 CLINICAL DATA:  Head trauma 4 days ago. Decreased appetite and weakness. Neck trauma. EXAM: CT HEAD WITHOUT CONTRAST CT CERVICAL SPINE WITHOUT CONTRAST TECHNIQUE: Multidetector CT imaging of the head and cervical spine was performed following the standard protocol without intravenous contrast. Multiplanar CT image reconstructions of the cervical spine were also generated. RADIATION DOSE REDUCTION: This exam was performed according to the departmental dose-optimization program which includes automated exposure control, adjustment of the mA and/or kV according to patient size and/or use of iterative reconstruction technique. COMPARISON:  None Available. FINDINGS: CT HEAD FINDINGS Brain: No intracranial hemorrhage, mass effect, or evidence of acute infarct. No hydrocephalus. No extra-axial fluid collection. Generalized cerebral atrophy. Ill-defined hypoattenuation within the cerebral white matter is nonspecific but consistent with chronic small vessel ischemic disease. Vascular: No hyperdense vessel. Intracranial arterial calcification. Skull: No fracture or focal lesion. Sinuses/Orbits: No acute finding. Frothy secretions in  the sphenoid sinuses. Mucosal thickening in the left maxillary sinus. No mastoid effusion. Other: None. CT CERVICAL SPINE FINDINGS Alignment: No evidence of traumatic malalignment. Skull base and vertebrae: No acute fracture. No primary bone lesion or focal pathologic process. Soft tissues and spinal canal: No prevertebral fluid or swelling. No visible canal hematoma. Disc levels: Intervertebral disc space height is maintained. No severe spinal canal or neural foraminal narrowing. Upper chest: No acute abnormality. Other: Carotid calcification. IMPRESSION: 1. No acute intracranial abnormality. 2. No acute fracture in the cervical spine. Electronically Signed   By: Minerva Fester M.D.   On: 11/02/2022 23:40   CT Cervical Spine Wo Contrast  Result Date: 11/02/2022 CLINICAL DATA:  Head trauma 4 days ago. Decreased appetite and weakness. Neck trauma. EXAM: CT HEAD WITHOUT CONTRAST CT CERVICAL SPINE WITHOUT CONTRAST TECHNIQUE: Multidetector CT imaging of the head and cervical spine was performed following the standard protocol without intravenous contrast. Multiplanar CT image reconstructions of the cervical spine were also generated. RADIATION DOSE REDUCTION: This exam was performed according to the departmental dose-optimization program which includes automated exposure control, adjustment of the mA and/or kV according to patient size and/or use of iterative reconstruction technique. COMPARISON:  None Available. FINDINGS: CT HEAD FINDINGS Brain: No intracranial hemorrhage, mass effect, or evidence of acute infarct. No hydrocephalus. No extra-axial fluid collection. Generalized cerebral atrophy. Ill-defined hypoattenuation within the cerebral white matter is nonspecific but consistent with chronic small vessel ischemic disease. Vascular: No hyperdense vessel. Intracranial arterial calcification. Skull: No fracture or focal lesion. Sinuses/Orbits: No acute finding. Frothy secretions in the sphenoid sinuses. Mucosal  thickening in the left maxillary sinus. No mastoid effusion. Other: None. CT CERVICAL SPINE FINDINGS Alignment: No evidence of traumatic malalignment. Skull base and vertebrae: No acute fracture. No primary bone lesion or focal pathologic process. Soft tissues and spinal canal: No prevertebral fluid or swelling. No visible canal hematoma. Disc levels: Intervertebral disc space height is maintained. No severe spinal canal or neural foraminal narrowing. Upper chest: No acute abnormality. Other: Carotid calcification. IMPRESSION: 1. No acute intracranial abnormality. 2. No acute fracture in the cervical spine. Electronically Signed   By: Minerva Fester M.D.   On: 11/02/2022 23:40    Pending Labs [employer] (From admission, onward)     Start     Ordered  11/03/22 0500  CBC  Tomorrow morning,   R        11/03/22 0317   11/03/22 0500  Basic metabolic panel  Tomorrow morning,   R        11/03/22 0317            Vitals/Pain Today's Vitals   11/02/22 2217 11/03/22 0115 11/03/22 0253 11/03/22 0315  BP:  (!) 143/67  (!) 153/56  Pulse:  (!) 58  (!) 57  Resp:  19  18  Temp: 98.3 F (36.8 C)  97.9 F (36.6 C)   TempSrc: Oral  Axillary   SpO2:  92%  93%  PainSc: 10-Worst pain ever       Isolation Precautions No active isolations  Medications Medications  fentaNYL (SUBLIMAZE) injection 25 mcg (has no administration in time range)  ondansetron (ZOFRAN) injection 4 mg (has no administration in time range)  promethazine (PHENERGAN) 12.5 mg in sodium chloride 0.9 % 50 mL IVPB (has no administration in time range)  ketorolac (TORADOL) 30 MG/ML injection 15 mg (has no administration in time range)  enoxaparin (LOVENOX) injection 40 mg (has no administration in time range)  lactated ringers infusion (has no administration in time range)  acetaminophen (TYLENOL) tablet 650 mg (has no administration in time range)    Or  acetaminophen (TYLENOL) suppository 650 mg (has no administration in  time range)  morphine (PF) 4 MG/ML injection 4 mg (4 mg Intravenous Given 11/02/22 2349)  ondansetron (ZOFRAN) injection 4 mg (4 mg Intravenous Given 11/03/22 0131)  ondansetron (ZOFRAN) injection 4 mg (4 mg Intravenous Given 11/03/22 0226)    Mobility non-ambulatory

## 2022-11-03 NOTE — Progress Notes (Addendum)
[facility]                                                                              Danielle Houston, is a 82 y.o. female, DOB - [birth year], LKG:[id number] Admit date - 11/02/2022    Outpatient Primary MD for the patient is Hoy Register, MD  LOS - 0  days  Chief Complaint  Patient presents with   Fall       Brief summary   Patient is a 82 year old female with hypertension, hypothyroidism, CAD status post CABG had a mechanical fall while working outside 4 days ago.  Patient picked up something and fell backwards onto her back and buttocks, did not hit head.  Since that time she has had intractable low back pain and unable to ambulate.  No radiation down the legs, bowel or bladder incontinence, focal weakness. CT head, C-spine with no abnormality CT lumbar spine showed acute to subacute compression fracture of L2 with approximately 50% loss of height, 3 mm retropulsion, no significant neuroforaminal narrowing.  Diffuse osteopenia  Assessment & Plan    Principal Problem: Fall, intractable pain, compression fracture of lumbar vertebra (HCC) -CT lumbar spine showed acute to subacute compression fracture L2 with approximately 50% loss of height, 3 mm retropulsion -EDP discussed with neurosurgery NP, recommendations nonsurgical, TLSO, PT and pain control -Will obtain MRI lumbar spine, patient may be a candidate for kyphoplasty given retropulsion and 50% loss of height, intractable pain   Active Problems:   Nausea and vomiting -Patient was noted to have nausea and vomiting after morphine.  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   Weight as of this encounter: 44.8 kg.  Code Status: Full CODE STATUS DVT Prophylaxis:  enoxaparin (LOVENOX) injection 30 mg Start: 11/03/22 1400   Level of Care: Level of care: Med-Surg Family Communication: Disposition Plan:      Remains inpatient appropriate:      Procedures:    Consultants:   IR  Antimicrobials:   Anti-infectives (From admission, onward)    None          Medications  enoxaparin (LOVENOX) injection  30 mg Subcutaneous Q24H      Subjective:   Danielle Houston was seen and examined today.  Difficult to obtain review of system from the patient, no family member at the bedside.  Appears comfortable, no fevers or chills, no nausea vomiting.    Objective:   Vitals:   11/03/22 0345 11/03/22 0400  11/03/22 0546 11/03/22 0959  BP: (!) 113/50 (!) 108/50 (!) 140/60 126/64  Pulse: 62 62 (!) 58 61  Resp: 15 15 14 14   Temp:   98.3 F (36.8 C) 98.1 F (36.7 C)  TempSrc:   Oral Oral  SpO2: 92% 93% 95% 95%  Weight:  44.8 kg    Height:  5\' 2"  (1.575 m)      Intake/Output Summary (Last 24 hours) at 11/03/2022 1117 Last data filed at 11/03/2022 0600 Gross per 24 hour  Intake 20.38 ml  Output --  Net 20.38 ml     Wt Readings from Last 3 Encounters:  11/03/22 44.8 kg  05/05/22 50.1 kg  04/02/22 46.4 kg     Exam General: Alert and awake, NAD, appears comfortable Cardiovascular: S1 S2 auscultated,  RRR Respiratory: Clear to auscultation bilaterally Gastrointestinal: Soft, nontender, nondistended, + bowel sounds Ext: no pedal edema  bilaterally Neuro: does not follow commands, ?  Language barrier     Data Reviewed:  I have personally reviewed following labs    CBC Lab Results  Component Value Date   WBC 6.5 11/03/2022   RBC 2.71 (L) 11/03/2022   HGB 8.4 (L) 11/03/2022   HCT 26.1 (L) 11/03/2022   MCV 96.3 11/03/2022   MCH 31.0 11/03/2022   PLT 143 (L) 11/03/2022   MCHC 32.2 11/03/2022   RDW 12.1 11/03/2022   LYMPHSABS 0.9 11/02/2022   MONOABS 0.5 11/02/2022   EOSABS 0.1 11/02/2022   BASOSABS 0.0 11/02/2022     Last metabolic panel Lab Results  Component Value Date   NA 137 11/03/2022   K 4.5 11/03/2022   CL 108 11/03/2022   CO2 20 (L) 11/03/2022   BUN 58 (H) 11/03/2022   CREATININE 2.13 (H) 11/03/2022   GLUCOSE 107 (H) 11/03/2022   GFRNONAA 23 (L) 11/03/2022   GFRAA 42 (L) 04/24/2020   CALCIUM 8.2 (L) 11/03/2022   PROT 7.3 11/02/2022   ALBUMIN 3.5 11/02/2022   LABGLOB 3.3 04/02/2022   AGRATIO 1.3 04/02/2022   BILITOT 0.8 11/02/2022   ALKPHOS 86 11/02/2022   AST 17 11/02/2022   ALT 14 11/02/2022   ANIONGAP 9 11/03/2022    CBG (last 3)  No results for input(s): "GLUCAP" in the last 72 hours.    Coagulation Profile: No results for input(s): "INR", "PROTIME" in the last 168 hours.   Radiology Studies: I have personally reviewed the imaging studies  DG Abd Portable 2 Views  Result Date: 11/03/2022 CLINICAL DATA:  Vomiting EXAM: PORTABLE ABDOMEN - 2 VIEW COMPARISON:  Same day CT pelvis FINDINGS: The bowel gas pattern is normal. Moderate colonic stool load. There is no evidence of free air. Redemonstrated compression fracture of L2 as seen on CT 10/23/2022. IMPRESSION: No acute radiographic abnormality in the abdomen. Acute or subacute compression fracture of L2. Electronically Signed   By: Minerva Fester M.D.   On: 11/03/2022 03:20   DG Chest Portable 1 View  Result Date: 11/03/2022 CLINICAL DATA:  Vomiting EXAM: PORTABLE CHEST 1 VIEW COMPARISON:  Chest radiograph 12/20/2021 FINDINGS:  Stable cardiomediastinal silhouette. Aortic atherosclerotic calcification. Bibasilar atelectasis. No focal consolidation, pleural effusion, or pneumothorax. No displaced rib fractures. IMPRESSION: No active disease. Electronically Signed   By: Minerva Fester M.D.   On: 11/03/2022 03:18   CT Lumbar Spine Wo Contrast  Addendum Date: 11/03/2022   ADDENDUM REPORT: 11/03/2022 00:00 ADDENDUM: These results were called by telephone at the time of interpretation on 11/02/2022 at 11:59 pm to provider [facility]  RANCOUR , who verbally acknowledged these results. Electronically Signed   By: Helyn Numbers M.D.   On: 11/03/2022 00:00   Result Date: 11/03/2022 CLINICAL DATA:  Back trauma, no prior imaging (Age >= 16y), lumbosacral pain, unable to ambulate. EXAM: CT LUMBAR SPINE WITHOUT CONTRAST TECHNIQUE: Multidetector CT imaging of the lumbar spine was performed without intravenous contrast administration. Multiplanar CT image reconstructions were also generated. RADIATION DOSE REDUCTION: This exam was performed according to the departmental dose-optimization program which includes automated exposure control, adjustment of the mA and/or kV according to patient size and/or use of iterative reconstruction technique. COMPARISON:  None Available. FINDINGS: Segmentation: 5 lumbar type vertebrae. Alignment: Mild straightening of the lumbar spine.  No listhesis. Vertebrae: The osseous structures are diffusely osteopenic. There is an acute to subacute compression fracture of L2 with approximately 50% loss of height. The fracture plane is seen extending obliquely in the coronal plane and involving the anterior, inferior, and superior vertebral cortices. The posterior cortex appears intact though there is minimal retropulsion of the posterosuperior aspect of the L2 vertebral body by approximately 3 mm. Posterior elements appear intact. Remaining vertebral body height appears preserved. Paraspinal and other soft tissues: Mild  paravertebral infiltration noted at L2 in keeping with a trace amount of interstitial hemorrhage or edema. No paraspinal fluid collections. Atherosclerotic calcification noted within the abdominal aorta. Disc levels: Mild retropulsion of the posterosuperior aspect of L2 results in minimal canal stenosis but no significant neuroforaminal narrowing. Broad-based posterior disc bulge at L4-5 in combination with mild facet arthrosis and laminar hypertrophy results in mild central canal stenosis but no significant neuroforaminal narrowing. No high-grade canal stenosis or neuroforaminal narrowing identified. IMPRESSION: 1. Acute to subacute compression fracture of L2 with approximately 50% loss of height. Minimal retropulsion of the posterosuperior aspect of the L2 vertebral body by approximately 3 mm results in minimal canal stenosis but no significant neuroforaminal narrowing. 2. Diffuse osteopenia. 3. 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 4. Aortic atherosclerosis. Aortic Atherosclerosis (ICD10-I70.0). Electronically Signed: By: Helyn Numbers M.D. On: 11/02/2022 23:51   CT PELVIS WO CONTRAST  Result Date: 11/02/2022 CLINICAL DATA:  Hip trauma, fracture suspected. Increasing sacral pain and unwillingness to ambulate. EXAM: CT PELVIS WITHOUT CONTRAST TECHNIQUE: Multidetector CT imaging of the pelvis was performed following the standard protocol without intravenous contrast. RADIATION DOSE REDUCTION: This exam was performed according to the departmental dose-optimization program which includes automated exposure control, adjustment of the mA and/or kV according to patient size and/or use of iterative reconstruction technique. COMPARISON:  None Available. FINDINGS: Urinary Tract:  No abnormality visualized. Bowel:  Unremarkable visualized pelvic bowel loops. Vascular/Lymphatic: Aortic atherosclerotic calcification. No lymphadenopathy. Reproductive:   Pessary.  No acute abnormality. Other:  None. Musculoskeletal: Demineralization. No acute fracture or dislocation. Degenerative changes both hips, pubic symphysis, and SI joints. IMPRESSION: 1. No acute fracture or dislocation. Aortic Atherosclerosis (ICD10-I70.0). Electronically Signed   By: Minerva Fester M.D.   On: 11/02/2022 23:45   CT Head Wo Contrast  Result Date: 11/02/2022 CLINICAL DATA:  Head trauma 4 days ago. Decreased appetite and weakness. Neck trauma. EXAM: CT HEAD WITHOUT CONTRAST CT CERVICAL SPINE WITHOUT CONTRAST TECHNIQUE: Multidetector CT imaging of the head and cervical spine was performed following the standard protocol without intravenous contrast. Multiplanar CT image reconstructions of the cervical spine were also generated. RADIATION DOSE REDUCTION: This exam was performed according to the departmental dose-optimization program which includes automated exposure control, adjustment of  the mA and/or kV according to patient size and/or use of iterative reconstruction technique. COMPARISON:  None Available. FINDINGS: CT HEAD FINDINGS Brain: No intracranial hemorrhage, mass effect, or evidence of acute infarct. No hydrocephalus. No extra-axial fluid collection. Generalized cerebral atrophy. Ill-defined hypoattenuation within the cerebral white matter is nonspecific but consistent with chronic small vessel ischemic disease. Vascular: No hyperdense vessel. Intracranial arterial calcification. Skull: No fracture or focal lesion. Sinuses/Orbits: No acute finding. Frothy secretions in the sphenoid sinuses. Mucosal thickening in the left maxillary sinus. No mastoid effusion. Other: None. CT CERVICAL SPINE FINDINGS Alignment: No evidence of traumatic malalignment. Skull base and vertebrae: No acute fracture. No primary bone lesion or focal pathologic process. Soft tissues and spinal canal: No prevertebral fluid or swelling. No visible canal hematoma. Disc levels: Intervertebral disc space height is  maintained. No severe spinal canal or neural foraminal narrowing. Upper chest: No acute abnormality. Other: Carotid calcification. IMPRESSION: 1. No acute intracranial abnormality. 2. No acute fracture in the cervical spine. Electronically Signed   By: Minerva Fester M.D.   On: 11/02/2022 23:40   CT Cervical Spine Wo Contrast  Result Date: 11/02/2022 CLINICAL DATA:  Head trauma 4 days ago. Decreased appetite and weakness. Neck trauma. EXAM: CT HEAD WITHOUT CONTRAST CT CERVICAL SPINE WITHOUT CONTRAST TECHNIQUE: Multidetector CT imaging of the head and cervical spine was performed following the standard protocol without intravenous contrast. Multiplanar CT image reconstructions of the cervical spine were also generated. RADIATION DOSE REDUCTION: This exam was performed according to the departmental dose-optimization program which includes automated exposure control, adjustment of the mA and/or kV according to patient size and/or use of iterative reconstruction technique. COMPARISON:  None Available. FINDINGS: CT HEAD FINDINGS Brain: No intracranial hemorrhage, mass effect, or evidence of acute infarct. No hydrocephalus. No extra-axial fluid collection. Generalized cerebral atrophy. Ill-defined hypoattenuation within the cerebral white matter is nonspecific but consistent with chronic small vessel ischemic disease. Vascular: No hyperdense vessel. Intracranial arterial calcification. Skull: No fracture or focal lesion. Sinuses/Orbits: No acute finding. Frothy secretions in the sphenoid sinuses. Mucosal thickening in the left maxillary sinus. No mastoid effusion. Other: None. CT CERVICAL SPINE FINDINGS Alignment: No evidence of traumatic malalignment. Skull base and vertebrae: No acute fracture. No primary bone lesion or focal pathologic process. Soft tissues and spinal canal: No prevertebral fluid or swelling. No visible canal hematoma. Disc levels: Intervertebral disc space height is maintained. No severe spinal  canal or neural foraminal narrowing. Upper chest: No acute abnormality. Other: Carotid calcification. IMPRESSION: 1. No acute intracranial abnormality. 2. No acute fracture in the cervical spine. Electronically Signed   By: Minerva Fester M.D.   On: 11/02/2022 23:40       Danielle Houston M.D. [facility] 11/03/2022, 11:17 AM  Available via Epic secure chat 7am-7pm After 7 pm, please refer to night coverage provider listed on amion.

## 2022-11-03 NOTE — Progress Notes (Addendum)
OT Cancellation Note  Patient Details Name: Danielle Houston MRN: [id number] DOB: [birth year]   Cancelled Treatment:    Reason Eval/Treat Not Completed: Other (comment) Nurse said that family would be in later today to help with determining patients appropriate dialect for interpretation and patient was NPO at this time pending possible procedure. Pending TLSO brace as well. OT to continue to follow Rosalio Loud, MS Acute Rehabilitation Department Office# [phone]  11/03/2022, 8:48 AM

## 2022-11-03 NOTE — Assessment & Plan Note (Addendum)
L2 compression fx with 3mm retropulsion.  Canal stenosis but no impingement. Per NS Bergman NP: Non surgical TLSO lumbar-sacral brace WBAT with brace Pain control MRI L spine pending Morphine caused onset of N/V Got 1 dose of toradol 15mg  in ED which seemed to work for pain per RN; however, can't keep doing toradol on this patient given her CKD 4 (creat 2.1 today) Try Fentanyl for further pain to see if this causes less GI side effects. PT/OT

## 2022-11-03 NOTE — Assessment & Plan Note (Signed)
Creat 2.1 today but this appears to be baseline. Monitor BMP during hospital stay.

## 2022-11-03 NOTE — H&P (Addendum)
History and Physical    Patient: Danielle Houston LKG:[id number] DOB: 1940/11/17 DOA: 11/02/2022 DOS: the patient was seen and examined on 11/03/2022 PCP: Hoy Register, MD  Patient coming from: Home  Chief Complaint:  Chief Complaint  Patient presents with   Fall   HPI: Danielle Houston is a 82 y.o. female with medical history significant of HTN, hypothyroidism, CAD s/p CABG.  Pt with mechanical fall while working outside 4 days ago.  Picked up something and fell backwards onto her low back and buttocks.  Didn't hit head.  Since that time she has had intractable low back pain, unable to stand or ambulate.  Pain mostly on R side and in middle.  No radiation down legs.  No bowel nor bladder incontinence, no focal weakness, numbness, or tingling.   Review of Systems: As mentioned in the history of present illness. All other systems reviewed and are negative. Past Medical History:  Diagnosis Date   Coronary artery disease    Hypertension    Hypothyroidism    Past Surgical History:  Procedure Laterality Date   CARDIAC CATHETERIZATION     CORONARY ANGIOPLASTY     Social History:  reports that she has never smoked. She has never used smokeless tobacco. She reports that she does not drink alcohol and does not use drugs.  No Known Allergies  Family History  Problem Relation Age of Onset   CAD Neg Hx     Prior to Admission medications   Medication Sig Start Date End Date Taking? Authorizing Provider  alendronate (FOSAMAX) 70 MG tablet Take 1 tablet (70 mg total) by mouth every 7 (seven) days. Take with a full glass of water on an empty stomach. 04/25/22   Hoy Register, MD  allopurinol (ZYLOPRIM) 100 MG tablet Take 0.5 tablets (50 mg total) by mouth every other day. 05/22/22   Hoy Register, MD  amLODipine (NORVASC) 5 MG tablet Take 1 tablet (5 mg total) by mouth daily. 05/05/22   Hoy Register, MD  aspirin EC 81 MG tablet Take 1 tablet (81 mg total) by mouth daily. Swallow whole. 10/26/19    Hoy Register, MD  atorvastatin (LIPITOR) 40 MG tablet TAKE 1 TABLET (40 MG TOTAL) BY MOUTH DAILY. 05/22/22   Hoy Register, MD  carvedilol (COREG) 12.5 MG tablet TAKE 1 TABLET (12.5 MG TOTAL) BY MOUTH 2 (TWO) TIMES DAILY WITH A MEAL. 05/22/22   Hoy Register, MD  conjugated estrogens (PREMARIN) vaginal cream Place 0.5 g vaginally nightly for two weeks then twice a week thereafter 04/21/22   Selmer Dominion, NP  cyanocobalamin (VITAMIN B12) 1000 MCG tablet Take 1 tablet (1,000 mcg total) by mouth daily. 12/22/21   Elgergawy, Leana Roe, MD  diclofenac Sodium (VOLTAREN) 1 % GEL APPLY 2 Grams TOPICALLY 4 (FOUR) TIMES DAILY as needed for PAIN 04/02/22   Hoy Register, MD  hydrALAZINE (APRESOLINE) 100 MG tablet Take 1 tablet (100 mg total) by mouth 2 (two) times daily. 04/02/22   Hoy Register, MD  isosorbide mononitrate (IMDUR) 120 MG 24 hr tablet Take 1 tablet (120 mg total) by mouth daily. 05/22/22   Hoy Register, MD  lidocaine (LIDODERM) 5 % Place 1 patch onto the skin daily. Remove & Discard patch within 12 hours or as directed by MD 04/02/22   Hoy Register, MD  omeprazole (PRILOSEC) 20 MG capsule Take 1 capsule (20 mg total) by mouth 2 (two) times daily before a meal. 05/05/22   Hoy Register, MD  sodium zirconium cyclosilicate (LOKELMA) 5 g  packet Take 10 g by mouth daily. 04/03/22   Hoy Register, MD  gabapentin (NEURONTIN) 300 MG capsule Take 1 capsule (300 mg total) by mouth at bedtime. 01/23/20 04/24/20  Hoy Register, MD    Physical Exam: Vitals:   11/02/22 2217 11/02/22 2217 11/03/22 0115 11/03/22 0253  BP: (!) 162/68  (!) 143/67   Pulse: 67  (!) 58   Resp: 19  19   Temp:  98.3 F (36.8 C)  97.9 F (36.6 C)  TempSrc: Oral Oral  Axillary  SpO2: 95%  92%    Constitutional: Elderly and frail, having N/V at time of my evaluation Respiratory: clear to auscultation bilaterally, no wheezing, no crackles. Normal respiratory effort. No accessory muscle use.  Cardiovascular: Regular  rate and rhythm, no murmurs / rubs / gallops. No extremity edema. 2+ pedal pulses. No carotid bruits.  Abdomen: no tenderness, no masses palpated. No hepatosplenomegaly. Bowel sounds positive.  Musculoskeletal: TTP lumbar spine Neurologic: CN 2-12 grossly intact. Sensation intact, DTR normal. Strength 5/5 in all 4. LE motor seems to be limited by pain.   Data Reviewed:    Labs on Admission: I have personally reviewed following labs and imaging studies  CBC: Recent Labs  Lab 11/02/22 2227  WBC 6.5  NEUTROABS 4.9  HGB 8.6*  HCT 26.6*  MCV 96.0  PLT 144*   Basic Metabolic Panel: Recent Labs  Lab 11/02/22 2227  NA 138  K 4.4  CL 111  CO2 20*  GLUCOSE 122*  BUN 59*  CREATININE 2.18*  CALCIUM 8.4*   GFR: CrCl cannot be calculated (Unknown ideal weight.). Liver Function Tests: Recent Labs  Lab 11/02/22 2227  AST 17  ALT 14  ALKPHOS 86  BILITOT 0.8  PROT 7.3  ALBUMIN 3.5   No results for input(s): "LIPASE", "AMYLASE" in the last 168 hours. No results for input(s): "AMMONIA" in the last 168 hours. Coagulation Profile: No results for input(s): "INR", "PROTIME" in the last 168 hours. Cardiac Enzymes: No results for input(s): "CKTOTAL", "CKMB", "CKMBINDEX", "TROPONINI" in the last 168 hours. BNP (last 3 results) No results for input(s): "PROBNP" in the last 8760 hours. HbA1C: No results for input(s): "HGBA1C" in the last 72 hours. CBG: No results for input(s): "GLUCAP" in the last 168 hours. Lipid Profile: No results for input(s): "CHOL", "HDL", "LDLCALC", "TRIG", "CHOLHDL", "LDLDIRECT" in the last 72 hours. Thyroid Function Tests: No results for input(s): "TSH", "T4TOTAL", "FREET4", "T3FREE", "THYROIDAB" in the last 72 hours. Anemia Panel: No results for input(s): "VITAMINB12", "FOLATE", "FERRITIN", "TIBC", "IRON", "RETICCTPCT" in the last 72 hours. Urine analysis:    Component Value Date/Time   COLORURINE YELLOW 11/03/2022 0144   APPEARANCEUR HAZY (A)  11/03/2022 0144   LABSPEC 1.015 11/03/2022 0144   PHURINE 5.0 11/03/2022 0144   GLUCOSEU NEGATIVE 11/03/2022 0144   HGBUR NEGATIVE 11/03/2022 0144   BILIRUBINUR NEGATIVE 11/03/2022 0144   BILIRUBINUR negative 12/18/2021 1148   BILIRUBINUR Negative 05/14/2021 1534   KETONESUR NEGATIVE 11/03/2022 0144   PROTEINUR NEGATIVE 11/03/2022 0144   UROBILINOGEN 0.2 12/18/2021 1148   NITRITE NEGATIVE 11/03/2022 0144   LEUKOCYTESUR TRACE (A) 11/03/2022 0144    Radiological Exams on Admission: DG Abd Portable 2 Views  Result Date: 11/03/2022 CLINICAL DATA:  Vomiting EXAM: PORTABLE ABDOMEN - 2 VIEW COMPARISON:  Same day CT pelvis FINDINGS: The bowel gas pattern is normal. Moderate colonic stool load. There is no evidence of free air. Redemonstrated compression fracture of L2 as seen on CT 10/23/2022. IMPRESSION: No acute radiographic  abnormality in the abdomen. Acute or subacute compression fracture of L2. Electronically Signed   By: Minerva Fester M.D.   On: 11/03/2022 03:20   DG Chest Portable 1 View  Result Date: 11/03/2022 CLINICAL DATA:  Vomiting EXAM: PORTABLE CHEST 1 VIEW COMPARISON:  Chest radiograph 12/20/2021 FINDINGS: Stable cardiomediastinal silhouette. Aortic atherosclerotic calcification. Bibasilar atelectasis. No focal consolidation, pleural effusion, or pneumothorax. No displaced rib fractures. IMPRESSION: No active disease. Electronically Signed   By: Minerva Fester M.D.   On: 11/03/2022 03:18   CT Lumbar Spine Wo Contrast  Addendum Date: 11/03/2022   ADDENDUM REPORT: 11/03/2022 00:00 ADDENDUM: These results were called by telephone at the time of interpretation on 11/02/2022 at 11:59 pm to provider [facility] , who verbally acknowledged these results. Electronically Signed   By: Helyn Numbers M.D.   On: 11/03/2022 00:00   Result Date: 11/03/2022 CLINICAL DATA:  Back trauma, no prior imaging (Age >= 16y), lumbosacral pain, unable to ambulate. EXAM: CT LUMBAR SPINE WITHOUT  CONTRAST TECHNIQUE: Multidetector CT imaging of the lumbar spine was performed without intravenous contrast administration. Multiplanar CT image reconstructions were also generated. RADIATION DOSE REDUCTION: This exam was performed according to the departmental dose-optimization program which includes automated exposure control, adjustment of the mA and/or kV according to patient size and/or use of iterative reconstruction technique. COMPARISON:  None Available. FINDINGS: Segmentation: 5 lumbar type vertebrae. Alignment: Mild straightening of the lumbar spine.  No listhesis. Vertebrae: The osseous structures are diffusely osteopenic. There is an acute to subacute compression fracture of L2 with approximately 50% loss of height. The fracture plane is seen extending obliquely in the coronal plane and involving the anterior, inferior, and superior vertebral cortices. The posterior cortex appears intact though there is minimal retropulsion of the posterosuperior aspect of the L2 vertebral body by approximately 3 mm. Posterior elements appear intact. Remaining vertebral body height appears preserved. Paraspinal and other soft tissues: Mild paravertebral infiltration noted at L2 in keeping with a trace amount of interstitial hemorrhage or edema. No paraspinal fluid collections. Atherosclerotic calcification noted within the abdominal aorta. Disc levels: Mild retropulsion of the posterosuperior aspect of L2 results in minimal canal stenosis but no significant neuroforaminal narrowing. Broad-based posterior disc bulge at L4-5 in combination with mild facet arthrosis and laminar hypertrophy results in mild central canal stenosis but no significant neuroforaminal narrowing. No high-grade canal stenosis or neuroforaminal narrowing identified. IMPRESSION: 1. Acute to subacute compression fracture of L2 with approximately 50% loss of height. Minimal retropulsion of the posterosuperior aspect of the L2 vertebral body by  approximately 3 mm results in minimal canal stenosis but no significant neuroforaminal narrowing. 2. Diffuse osteopenia. 3. 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 4. Aortic atherosclerosis. Aortic Atherosclerosis (ICD10-I70.0). Electronically Signed: By: Helyn Numbers M.D. On: 11/02/2022 23:51   CT PELVIS WO CONTRAST  Result Date: 11/02/2022 CLINICAL DATA:  Hip trauma, fracture suspected. Increasing sacral pain and unwillingness to ambulate. EXAM: CT PELVIS WITHOUT CONTRAST TECHNIQUE: Multidetector CT imaging of the pelvis was performed following the standard protocol without intravenous contrast. RADIATION DOSE REDUCTION: This exam was performed according to the departmental dose-optimization program which includes automated exposure control, adjustment of the mA and/or kV according to patient size and/or use of iterative reconstruction technique. COMPARISON:  None Available. FINDINGS: Urinary Tract:  No abnormality visualized. Bowel:  Unremarkable visualized pelvic bowel loops. Vascular/Lymphatic: Aortic atherosclerotic calcification. No lymphadenopathy. Reproductive:  Pessary.  No acute abnormality. Other:  None. Musculoskeletal: Demineralization. No acute fracture or dislocation. Degenerative changes both hips, pubic symphysis, and SI joints. IMPRESSION: 1. No acute fracture or dislocation. Aortic Atherosclerosis (ICD10-I70.0). Electronically Signed   By: Minerva Fester M.D.   On: 11/02/2022 23:45   CT Head Wo Contrast  Result Date: 11/02/2022 CLINICAL DATA:  Head trauma 4 days ago. Decreased appetite and weakness. Neck trauma. EXAM: CT HEAD WITHOUT CONTRAST CT CERVICAL SPINE WITHOUT CONTRAST TECHNIQUE: Multidetector CT imaging of the head and cervical spine was performed following the standard protocol without intravenous contrast. Multiplanar CT image reconstructions of the cervical spine were also generated. RADIATION DOSE REDUCTION:  This exam was performed according to the departmental dose-optimization program which includes automated exposure control, adjustment of the mA and/or kV according to patient size and/or use of iterative reconstruction technique. COMPARISON:  None Available. FINDINGS: CT HEAD FINDINGS Brain: No intracranial hemorrhage, mass effect, or evidence of acute infarct. No hydrocephalus. No extra-axial fluid collection. Generalized cerebral atrophy. Ill-defined hypoattenuation within the cerebral white matter is nonspecific but consistent with chronic small vessel ischemic disease. Vascular: No hyperdense vessel. Intracranial arterial calcification. Skull: No fracture or focal lesion. Sinuses/Orbits: No acute finding. Frothy secretions in the sphenoid sinuses. Mucosal thickening in the left maxillary sinus. No mastoid effusion. Other: None. CT CERVICAL SPINE FINDINGS Alignment: No evidence of traumatic malalignment. Skull base and vertebrae: No acute fracture. No primary bone lesion or focal pathologic process. Soft tissues and spinal canal: No prevertebral fluid or swelling. No visible canal hematoma. Disc levels: Intervertebral disc space height is maintained. No severe spinal canal or neural foraminal narrowing. Upper chest: No acute abnormality. Other: Carotid calcification. IMPRESSION: 1. No acute intracranial abnormality. 2. No acute fracture in the cervical spine. Electronically Signed   By: Minerva Fester M.D.   On: 11/02/2022 23:40   CT Cervical Spine Wo Contrast  Result Date: 11/02/2022 CLINICAL DATA:  Head trauma 4 days ago. Decreased appetite and weakness. Neck trauma. EXAM: CT HEAD WITHOUT CONTRAST CT CERVICAL SPINE WITHOUT CONTRAST TECHNIQUE: Multidetector CT imaging of the head and cervical spine was performed following the standard protocol without intravenous contrast. Multiplanar CT image reconstructions of the cervical spine were also generated. RADIATION DOSE REDUCTION: This exam was performed  according to the departmental dose-optimization program which includes automated exposure control, adjustment of the mA and/or kV according to patient size and/or use of iterative reconstruction technique. COMPARISON:  None Available. FINDINGS: CT HEAD FINDINGS Brain: No intracranial hemorrhage, mass effect, or evidence of acute infarct. No hydrocephalus. No extra-axial fluid collection. Generalized cerebral atrophy. Ill-defined hypoattenuation within the cerebral white matter is nonspecific but consistent with chronic small vessel ischemic disease. Vascular: No hyperdense vessel. Intracranial arterial calcification. Skull: No fracture or focal lesion. Sinuses/Orbits: No acute finding. Frothy secretions in the sphenoid sinuses. Mucosal thickening in the left maxillary sinus. No mastoid effusion. Other: None. CT CERVICAL SPINE FINDINGS Alignment: No evidence of traumatic malalignment. Skull base and vertebrae: No acute fracture. No primary bone lesion or focal pathologic process. Soft tissues and spinal canal: No prevertebral fluid or swelling. No visible canal hematoma. Disc levels: Intervertebral disc space height is maintained. No severe spinal canal or neural foraminal narrowing. Upper chest: No acute abnormality. Other: Carotid calcification. IMPRESSION: 1. No acute intracranial abnormality. 2. No acute fracture in the cervical spine. Electronically Signed   By: Minerva Fester M.D.   On: 11/02/2022 23:40    EKG: Independently reviewed.   Assessment and Plan: * Compression fracture of lumbar vertebra (HCC)  L2 compression fx with 3mm retropulsion.  Canal stenosis but no impingement. Per NS Bergman NP: Non surgical TLSO lumbar-sacral brace WBAT with brace Pain control MRI L spine pending Morphine caused onset of N/V Got 1 dose of toradol 15mg  in ED which seemed to work for pain per RN; however, can't keep doing toradol on this patient given her CKD 4 (creat 2.1 today) Try Fentanyl for further pain  to see if this causes less GI side effects. PT/OT  Nausea and vomiting Gentle hydration given onset of N/V. Suspect N/V may be side effect of morphine she got earlier in shift as she didn't have this problem prior to coming to ED earlier this evening. KUB = non-obstructive pattern. 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 Monitor BMP during hospital stay.  Essential hypertension BP currently 108/50. Home med rec is pending still.  Hypothyroidism Med rec pending. Doesn't look like she's on any thyroid replacement therapy.  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  HGB of 8.6 today is unchanged compared to October of last year (also 8.6).      Advance Care Planning:   Code Status: Full Code  Consults: EDP d/w Doran Durand NP  Family Communication: No family in room  Severity of Illness: The appropriate patient status for this patient is OBSERVATION. Observation status is judged to be reasonable and necessary in order to provide the required intensity of service to ensure the patient's safety. The patient's presenting symptoms, physical exam findings, and initial radiographic and laboratory data in the context of their medical condition is felt to place them at decreased risk for further clinical deterioration. Furthermore, it is anticipated that the patient will be medically stable for discharge from the hospital within 2 midnights of admission.   Author: Hillary Bow., DO 11/03/2022 3:19 AM  For on call review [email or web address].

## 2022-11-03 NOTE — Assessment & Plan Note (Addendum)
Gentle hydration given onset of N/V. Suspect N/V may be side effect of morphine she got earlier in shift as she didn't have this problem prior to coming to ED earlier this evening. KUB = non-obstructive pattern. PRN zofran Switch morphine to toradol / fentanyl if needed

## 2022-11-03 NOTE — Plan of Care (Signed)
Called and talked daughter Emiliano Dyer to get admission questions answered. Interpreter machine has no language to help.  Problem: Education: Goal: Knowledge of General Education information will improve Description: Including pain rating scale, medication(s)/side effects and non-pharmacologic comfort measures Outcome: Not Progressing

## 2022-11-03 NOTE — Progress Notes (Signed)
Orthopedic Tech Progress Note Patient Details:  Danielle Houston December 05, 1940 [id number]  Patient ID: Danielle Houston, female   DOB: 1940-09-19, 82 y.o.   MRN: [id number] I called order into [facility] 11/03/2022, 12:42 AM

## 2022-11-03 NOTE — ED Notes (Signed)
Pt vomited MD made aware, please see orders

## 2022-11-04 DIAGNOSIS — E039 Hypothyroidism, unspecified: Secondary | ICD-10-CM | POA: Diagnosis present

## 2022-11-04 DIAGNOSIS — N179 Acute kidney failure, unspecified: Secondary | ICD-10-CM | POA: Diagnosis not present

## 2022-11-04 DIAGNOSIS — Z603 Acculturation difficulty: Secondary | ICD-10-CM | POA: Diagnosis present

## 2022-11-04 DIAGNOSIS — Z751 Person awaiting admission to adequate facility elsewhere: Secondary | ICD-10-CM | POA: Diagnosis not present

## 2022-11-04 DIAGNOSIS — S32000A Wedge compression fracture of unspecified lumbar vertebra, initial encounter for closed fracture: Secondary | ICD-10-CM | POA: Diagnosis present

## 2022-11-04 DIAGNOSIS — Z7902 Long term (current) use of antithrombotics/antiplatelets: Secondary | ICD-10-CM | POA: Diagnosis not present

## 2022-11-04 DIAGNOSIS — W19XXXA Unspecified fall, initial encounter: Secondary | ICD-10-CM | POA: Diagnosis not present

## 2022-11-04 DIAGNOSIS — Z7983 Long term (current) use of bisphosphonates: Secondary | ICD-10-CM | POA: Diagnosis not present

## 2022-11-04 DIAGNOSIS — R339 Retention of urine, unspecified: Secondary | ICD-10-CM | POA: Diagnosis not present

## 2022-11-04 DIAGNOSIS — K5900 Constipation, unspecified: Secondary | ICD-10-CM | POA: Diagnosis not present

## 2022-11-04 DIAGNOSIS — T402X5A Adverse effect of other opioids, initial encounter: Secondary | ICD-10-CM | POA: Diagnosis present

## 2022-11-04 DIAGNOSIS — I129 Hypertensive chronic kidney disease with stage 1 through stage 4 chronic kidney disease, or unspecified chronic kidney disease: Secondary | ICD-10-CM | POA: Diagnosis present

## 2022-11-04 DIAGNOSIS — E038 Other specified hypothyroidism: Secondary | ICD-10-CM

## 2022-11-04 DIAGNOSIS — Z9861 Coronary angioplasty status: Secondary | ICD-10-CM | POA: Diagnosis not present

## 2022-11-04 DIAGNOSIS — Z79899 Other long term (current) drug therapy: Secondary | ICD-10-CM | POA: Diagnosis not present

## 2022-11-04 DIAGNOSIS — E559 Vitamin D deficiency, unspecified: Secondary | ICD-10-CM | POA: Diagnosis present

## 2022-11-04 DIAGNOSIS — D631 Anemia in chronic kidney disease: Secondary | ICD-10-CM | POA: Diagnosis present

## 2022-11-04 DIAGNOSIS — M48061 Spinal stenosis, lumbar region without neurogenic claudication: Secondary | ICD-10-CM | POA: Diagnosis present

## 2022-11-04 DIAGNOSIS — R112 Nausea with vomiting, unspecified: Secondary | ICD-10-CM | POA: Diagnosis present

## 2022-11-04 DIAGNOSIS — W1839XA Other fall on same level, initial encounter: Secondary | ICD-10-CM | POA: Diagnosis present

## 2022-11-04 DIAGNOSIS — Z951 Presence of aortocoronary bypass graft: Secondary | ICD-10-CM | POA: Diagnosis not present

## 2022-11-04 DIAGNOSIS — S32020A Wedge compression fracture of second lumbar vertebra, initial encounter for closed fracture: Secondary | ICD-10-CM | POA: Diagnosis present

## 2022-11-04 DIAGNOSIS — I2510 Atherosclerotic heart disease of native coronary artery without angina pectoris: Secondary | ICD-10-CM | POA: Diagnosis present

## 2022-11-04 DIAGNOSIS — Z7982 Long term (current) use of aspirin: Secondary | ICD-10-CM | POA: Diagnosis not present

## 2022-11-04 DIAGNOSIS — R079 Chest pain, unspecified: Secondary | ICD-10-CM | POA: Diagnosis not present

## 2022-11-04 DIAGNOSIS — Y92009 Unspecified place in unspecified non-institutional (private) residence as the place of occurrence of the external cause: Secondary | ICD-10-CM | POA: Diagnosis not present

## 2022-11-04 DIAGNOSIS — N184 Chronic kidney disease, stage 4 (severe): Secondary | ICD-10-CM | POA: Diagnosis present

## 2022-11-04 DIAGNOSIS — M8588 Other specified disorders of bone density and structure, other site: Secondary | ICD-10-CM | POA: Diagnosis present

## 2022-11-04 LAB — BASIC METABOLIC PANEL
Anion gap: 7 (ref 5–15)
BUN: 63 mg/dL — ABNORMAL HIGH (ref 8–23)
CO2: 23 mmol/L (ref 22–32)
Calcium: 8.1 mg/dL — ABNORMAL LOW (ref 8.9–10.3)
Chloride: 107 mmol/L (ref 98–111)
Creatinine, Ser: 2.46 mg/dL — ABNORMAL HIGH (ref 0.44–1.00)
GFR, Estimated: 19 mL/min — ABNORMAL LOW (ref >60)
Glucose, Bld: 107 mg/dL — ABNORMAL HIGH (ref 70–99)
Potassium: 3.9 mmol/L (ref 3.5–5.1)
Sodium: 137 mmol/L (ref 135–145)

## 2022-11-04 LAB — VITAMIN D 25 HYDROXY (VIT D DEFICIENCY, FRACTURES): Vit D, 25-Hydroxy: 26.82 ng/mL — ABNORMAL LOW (ref 30–100)

## 2022-11-04 LAB — CBC
HCT: 28 % — ABNORMAL LOW (ref 36.0–46.0)
Hemoglobin: 8.8 g/dL — ABNORMAL LOW (ref 12.0–15.0)
MCH: 30.4 pg (ref 26.0–34.0)
MCHC: 31.4 g/dL (ref 30.0–36.0)
MCV: 96.9 fL (ref 80.0–100.0)
Platelets: 138 10*3/uL — ABNORMAL LOW (ref 150–400)
RBC: 2.89 MIL/uL — ABNORMAL LOW (ref 3.87–5.11)
RDW: 12.1 % (ref 11.5–15.5)
WBC: 5.2 10*3/uL (ref 4.0–10.5)
nRBC: 0 % (ref 0.0–0.2)

## 2022-11-04 MED ORDER — VITAMIN D (ERGOCALCIFEROL) 1.25 MG (50000 UNIT) PO CAPS
50000.0000 [IU] | ORAL_CAPSULE | ORAL | Status: DC
  Administered 2022-11-04: 50000 [IU] via ORAL
  Filled 2022-11-04 (×2): qty 1

## 2022-11-04 MED ORDER — ENOXAPARIN SODIUM 30 MG/0.3ML IJ SOSY
30.0000 mg | PREFILLED_SYRINGE | INTRAMUSCULAR | Status: DC
  Administered 2022-11-06 – 2022-11-10 (×5): 30 mg via SUBCUTANEOUS
  Filled 2022-11-04 (×5): qty 0.3

## 2022-11-04 MED ORDER — CEFAZOLIN SODIUM-DEXTROSE 2-4 GM/100ML-% IV SOLN
2.0000 g | INTRAVENOUS | Status: AC
  Administered 2022-11-05: 2 g via INTRAVENOUS
  Filled 2022-11-04: qty 100

## 2022-11-04 MED ORDER — ALLOPURINOL 100 MG PO TABS
50.0000 mg | ORAL_TABLET | ORAL | Status: DC
  Administered 2022-11-04 – 2022-11-10 (×4): 50 mg via ORAL
  Filled 2022-11-04 (×4): qty 1

## 2022-11-04 MED ORDER — GABAPENTIN 100 MG PO CAPS
100.0000 mg | ORAL_CAPSULE | Freq: Two times a day (BID) | ORAL | Status: DC
  Administered 2022-11-04 – 2022-11-11 (×13): 100 mg via ORAL
  Filled 2022-11-04 (×13): qty 1

## 2022-11-04 MED ORDER — ISOSORBIDE MONONITRATE ER 60 MG PO TB24
120.0000 mg | ORAL_TABLET | Freq: Every day | ORAL | Status: DC
  Administered 2022-11-05 – 2022-11-11 (×7): 120 mg via ORAL
  Filled 2022-11-04 (×8): qty 2

## 2022-11-04 MED ORDER — ATORVASTATIN CALCIUM 40 MG PO TABS
40.0000 mg | ORAL_TABLET | Freq: Every day | ORAL | Status: DC
  Administered 2022-11-04 – 2022-11-11 (×7): 40 mg via ORAL
  Filled 2022-11-04 (×7): qty 1

## 2022-11-04 MED ORDER — OXYCODONE HCL 5 MG PO TABS: 5.0000 mg | ORAL_TABLET | ORAL | Status: DC | PRN

## 2022-11-04 NOTE — Evaluation (Addendum)
Occupational Therapy Evaluation Patient Details Name: Danielle Houston MRN: [id number] DOB: 31-Jul-1940 Today's Date: 11/04/2022   History of Present Illness Patient is a 82 year old female who presented after a fall at home resulting in back pain and inability to ambulate. CT revealed sub acute compression fx of L2 with approximately 50% loss of height, 3mm retropulsion. PMH: CKD, hypothyroidism, normocytic anemia, HTN   Clinical Impression   Patient is a 82 year old female who was admitted for above. Patient was supine in bed with interpreter # [id number] used to communicate with patient. Evaluation was limited by patients pain levels and dizziness in bed. Patient needed encouragement to participate in communication to attempt to better understand PLOF. Patient reporting increased dizziness supine in bed with BP of 184/69 mmhg and HR of 66 bpm. Patient's nurse made aware. Patient would continue to benefit from skilled OT services at this time while admitted and after d/c to address noted deficits in order to improve overall safety and independence in ADLs.        If plan is discharge home, recommend the following: Two people to help with walking and/or transfers;Two people to help with bathing/dressing/bathroom;Direct supervision/assist for medications management;Assistance with cooking/housework;Assist for transportation;Help with stairs or ramp for entrance;Direct supervision/assist for financial management;Assistance with feeding    Functional Status Assessment  Patient has had a recent decline in their functional status and/or demonstrates limited ability to make significant improvements in function in a reasonable and predictable amount of time  Equipment Recommendations  None recommended by OT       Precautions / Restrictions Precautions Precautions: Fall;Back Restrictions Weight Bearing Restrictions: No      Mobility Bed Mobility               General bed mobility comments:  patient was TD for movement in bed with patient declining repositioning.            ADL either performed or assessed with clinical judgement   ADL Overall ADL's : Needs assistance/impaired     Grooming: Total assistance;Bed level   Upper Body Bathing: Bed level;Total assistance   Lower Body Bathing: Bed level;Total assistance   Upper Body Dressing : Bed level;Total assistance   Lower Body Dressing: Bed level;Total assistance     Toilet Transfer Details (indicate cue type and reason): patient not agreeable to repositioning in the bed or for attempting to move with patient reporting "im in agony" nurse made aware. Toileting- Clothing Manipulation and Hygiene: Bed level;Total assistance               Vision   Additional Comments: patient noted to keep right eye closed during most of session. when asked if it bothers her patient reported " im dizzy".            Pertinent Vitals/Pain Pain Assessment Pain Assessment: 0-10 Pain Score: 10-Worst pain ever Pain Location: agony     Extremity/Trunk Assessment Upper Extremity Assessment Upper Extremity Assessment: Generalized weakness (difficulty with communication to be able to test this. patient reporting she was in agony)   Lower Extremity Assessment Lower Extremity Assessment: RLE deficits/detail;LLE deficits/detail RLE Deficits / Details: pt. did not move the legs at all RLE: Unable to fully assess due to pain LLE: Unable to fully assess due to pain          Cognition Arousal: Alert Behavior During Therapy: Flat affect Overall Cognitive Status: Difficult to assess  Home Living Family/patient expects to be discharged to:: Private residence Living Arrangements: Children Available Help at Discharge: Family;Available PRN/intermittently Type of Home: House Home Access: Stairs to enter Entrance Stairs-Number of Steps: 3                   Home Equipment: None   Additional  Comments: patient is hard to communicate with with interpreter via phone call. patient reported living alone and having falls. patient reported "if i lived with family i woud not fall". patient reported " i have to walk about 5-6 steps" when asked if she has stairs to get into the house. patient reported she "rents" when asking if she lives in house or apartment.              OT Problem List: Decreased activity tolerance;Impaired balance (sitting and/or standing);Decreased coordination;Decreased safety awareness;Decreased knowledge of precautions;Decreased knowledge of use of DME or AE;Pain;Cardiopulmonary status limiting activity      OT Treatment/Interventions: Self-care/ADL training;Energy conservation;Therapeutic exercise;DME and/or AE instruction;Patient/family education;Therapeutic activities;Balance training    OT Goals(Current goals can be found in the care plan section) Acute Rehab OT Goals Patient Stated Goal: none stated OT Goal Formulation: Patient unable to participate in goal setting Time For Goal Achievement: 11/18/22 Potential to Achieve Goals: Fair ADL Goals Pt Will Perform Grooming: with supervision;sitting Pt Will Transfer to Toilet: with max assist;squat pivot transfer;bedside commode  OT Frequency: Min 1X/week    Co-evaluation PT/OT/SLP Co-Evaluation/Treatment: Yes Reason for Co-Treatment: For patient/therapist safety;Necessary to address cognition/behavior during functional activity PT goals addressed during session: Mobility/safety with mobility OT goals addressed during session: ADL's and self-care      AM-PAC OT "6 Clicks" Daily Activity     Outcome Measure Help from another person eating meals?: A Little Help from another person taking care of personal grooming?: Total Help from another person toileting, which includes using toliet, bedpan, or urinal?: Total Help from another person bathing (including washing, rinsing, drying)?: Total Help from another  person to put on and taking off regular upper body clothing?: Total Help from another person to put on and taking off regular lower body clothing?: Total 6 Click Score: 8   End of Session Nurse Communication: Other (comment) (concerns over keeping R eye closed and dizziness reports.)  Activity Tolerance: Patient limited by pain Patient left: in bed;with call bell/phone within reach;with bed alarm set  OT Visit Diagnosis: Pain                Time: 6440-3474 OT Time Calculation (min): 34 min Charges:  OT General Charges $OT Visit: 1 Visit OT Evaluation $OT Eval Low Complexity: 1 Low  Ra Pfiester OTR/L, MS Acute Rehabilitation Department Office# [phone]   Selinda Flavin 11/04/2022, 3:48 PM

## 2022-11-04 NOTE — Progress Notes (Signed)
[facility]                                                                              Danielle Houston, is a 82 y.o. female, DOB - 06/30/1940, MWU:[id number] Admit date - 11/02/2022    Outpatient Primary MD for the patient is Hoy Register, MD  LOS - 0  days  Chief Complaint  Patient presents with   Fall       Brief summary   Patient is a 82 year old female with hypertension, hypothyroidism, CAD status post CABG had a mechanical fall while working outside 4 days ago.  Patient picked up something and fell backwards onto her back and buttocks, did not hit head.  Since that time she has had intractable low back pain and unable to ambulate.  No radiation down the legs, bowel or bladder incontinence, focal weakness. CT head, C-spine with no abnormality CT lumbar spine showed acute to subacute compression fracture of L2 with approximately 50% loss of height, 3 mm retropulsion, no significant neuroforaminal narrowing.  Diffuse osteopenia  Assessment & Plan    Principal Problem: Fall, intractable pain, compression fracture of lumbar vertebra (HCC) -CT lumbar spine showed acute to subacute compression fracture L2 with approximately 50% loss of height, 3 mm retropulsion -EDP discussed with neurosurgery NP, recommendations nonsurgical, TLSO, PT and pain control -Patient had intractable back pain, MRI L-spine showed acute compression fracture of L2 vertebral body with 60% vertebral body height loss, minimal retropulsion at the superior endplate resulting in mild canal stenosis  -Continue pain control, added oxycodone 5 mg every 4 hours as needed, Neurontin 100 mg twice daily, titrate up as needed.  Continue IV fentanyl for severe pain. - IR consulted for consideration of kyphoplasty  Active Problems:   Nausea and vomiting -Patient was noted to have nausea and vomiting after morphine in ED.  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   Weight as of this encounter: 44.8 kg.  Code Status: Full CODE STATUS DVT Prophylaxis:  enoxaparin (LOVENOX) injection 30 mg Start: 11/03/22 1400   Level of Care: Level of care: Med-Surg Family Communication: Disposition Plan:      Remains inpatient appropriate:      Procedures:  MRI lumbar spine Consultants:   IR  Antimicrobials:   Anti-infectives (From admission, onward)    None          Medications  allopurinol  50  mg Oral QODAY   atorvastatin  40 mg Oral Daily   carvedilol  12.5 mg Oral BID WC   enoxaparin (LOVENOX) injection  30 mg Subcutaneous Q24H   hydrALAZINE  100 mg Oral BID   isosorbide mononitrate  120 mg Oral Daily   pantoprazole  40 mg Oral Daily      Subjective:   Danielle Houston was seen and examined today.  Complaining of pain in her back, improves with the pain medications. No acute fever chills, nausea vomiting, chest pain.  (Audio interpreter used for the encounter)  Objective:   Vitals:   11/04/22 0203 11/04/22 0620 11/04/22 0951 11/04/22 1349  BP: (!) 153/73 (!) 145/62 (!) 118/53 136/72  Pulse: 64 64 68 67  Resp: 17 17 15 15   Temp: 98.4 F (36.9 C) 98.3 F (36.8 C) 98.8 F (37.1 C) 99.2 F (37.3 C)  TempSrc: Oral Oral  Oral  SpO2: 96% 97%  97% 98%  Weight:      Height:        Intake/Output Summary (Last 24 hours) at 11/04/2022 1510 Last data filed at 11/04/2022 1101 Gross per 24 hour  Intake 1203.75 ml  Output 650 ml  Net 553.75 ml     Wt Readings from Last 3 Encounters:  11/03/22 44.8 kg  05/05/22 50.1 kg  04/02/22 46.4 kg    Physical Exam General: Alert and oriented x 3, NAD Cardiovascular: S1 S2 clear, RRR.  Respiratory: CTAB, no wheezing Gastrointestinal: Soft, nontender, nondistended, NBS Ext: no pedal edema bilaterally Neuro: no new deficits psych: Normal affect     Data Reviewed:  I have personally reviewed following labs    CBC Lab Results  Component Value Date   WBC 5.2 11/04/2022   RBC 2.89 (L) 11/04/2022   HGB 8.8 (L) 11/04/2022   HCT 28.0 (L) 11/04/2022   MCV 96.9 11/04/2022   MCH 30.4 11/04/2022   PLT 138 (L) 11/04/2022   MCHC 31.4 11/04/2022   RDW 12.1 11/04/2022   LYMPHSABS 0.9 11/02/2022   MONOABS 0.5 11/02/2022   EOSABS 0.1 11/02/2022   BASOSABS 0.0 11/02/2022     Last metabolic panel Lab Results  Component Value Date   NA 137 11/04/2022   K 3.9 11/04/2022   CL 107 11/04/2022   CO2 23 11/04/2022   BUN 63 (H) 11/04/2022   CREATININE 2.46 (H) 11/04/2022   GLUCOSE 107 (H) 11/04/2022   GFRNONAA 19 (L) 11/04/2022   GFRAA 42 (L) 04/24/2020   CALCIUM 8.1 (L) 11/04/2022   PROT 7.3 11/02/2022   ALBUMIN 3.5 11/02/2022   LABGLOB 3.3 04/02/2022   AGRATIO 1.3 04/02/2022   BILITOT 0.8 11/02/2022   ALKPHOS 86 11/02/2022   AST 17 11/02/2022   ALT 14 11/02/2022   ANIONGAP 7 11/04/2022    CBG (last 3)  No results for input(s): "GLUCAP" in the last 72 hours.    Coagulation Profile: No results for input(s): "INR", "PROTIME" in the last 168 hours.   Radiology Studies: I have personally reviewed the imaging studies  MR THORACIC SPINE WO CONTRAST  Result Date: 11/03/2022 CLINICAL DATA:  Back trauma, abnormal neuro exam, CT or xray positive (Age >= 16y) EXAM: MRI THORACIC  SPINE WITHOUT CONTRAST TECHNIQUE: Multiplanar, multisequence MR imaging of the thoracic spine was performed. No intravenous contrast was administered. COMPARISON:  MRI 08/02/2019 FINDINGS: Alignment:  No traumatic listhesis. 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 The degree of height loss has progressed  since 2021. No bone marrow edema. Acute L2 compression fracture seen at the edge of the field of view, as described on dedicated same-day lumbar spine MRI. No additional fractures. No evidence of discitis. No marrow replacing bone lesion. Cord:  Normal signal and morphology. Paraspinal and other soft tissues: Negative. Disc levels: Mild degenerative disc disease throughout the thoracic spine. Moderate multilevel facet joint arthropathy. No high-grade foraminal or canal stenosis at any level. IMPRESSION: 1. No acute fracture or traumatic subluxation of the thoracic spine. 2. 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 The degree of height loss has progressed since 2021 although there is no associated bone marrow edema to suggest recent progression. 3. Mild-to-moderate degenerative changes of the thoracic spine. No high-grade foraminal or canal stenosis at any level. 4. Acute L2 compression fracture seen at the edge of the field of view, as described on dedicated same-day lumbar spine MRI. Electronically Signed   By: Duanne Guess D.O.   On: 11/03/2022 16:48   MR LUMBAR SPINE WO CONTRAST  Result Date: 11/03/2022 CLINICAL DATA:  Back trauma, abnormal neuro exam, CT or xray positive (Age >= 16y) EXAM: MRI LUMBAR SPINE WITHOUT CONTRAST TECHNIQUE: Multiplanar, multisequence MR imaging of the lumbar spine was performed. No intravenous contrast was administered. COMPARISON:  CT 11/02/2022 FINDINGS: Segmentation:  Standard. Alignment:  Physiologic. Vertebrae: Acute compression fracture of the L2  vertebral body with approximately 60% vertebral body height loss. Associated bone marrow edema throughout the vertebral body. Minimal retropulsion at the superior endplate. Remaining lumbar vertebral body heights are maintained. No additional fractures. No evidence of discitis. No marrow replacing bone lesion. Conus medullaris and cauda equina: Conus extends to the L1 level. Conus and cauda equina appear normal. Paraspinal and other soft tissues: Negative. Disc levels: T12-L1: No significant disc protrusion, foraminal stenosis, or canal stenosis. L1-L2: Mild retropulsion of the L2 superior endplate resulting in slight impress upon the ventral thecal sac and mild canal stenosis. No significant foraminal stenosis. L2-L3: No significant disc protrusion, foraminal stenosis, or canal stenosis. L3-L4: Minimal annular disc bulge and mild facet hypertrophy. Mild bilateral foraminal stenosis. No canal stenosis. L4-L5: Minimal annular disc bulge with mild facet hypertrophy. Mild bilateral foraminal stenosis. No canal stenosis. L5-S1: Minimal annular disc bulge with mild facet hypertrophy. Borderline-mild left foraminal stenosis. No canal stenosis. IMPRESSION: 1. Acute compression fracture of the L2 vertebral body with approximately 60% vertebral body height loss. Minimal retropulsion at the superior endplate resulting in mild canal stenosis. 2. Mild degenerative changes of the lumbar spine with mild bilateral foraminal stenosis at L3-L4 and L4-L5. Electronically Signed   By: Duanne Guess D.O.   On: 11/03/2022 16:41   DG Abd Portable 2 Views  Result Date: 11/03/2022 CLINICAL DATA:  Vomiting EXAM: PORTABLE ABDOMEN - 2 VIEW COMPARISON:  Same day CT pelvis FINDINGS: The bowel gas pattern is normal. Moderate colonic stool load. There is no evidence of free air. Redemonstrated compression fracture of L2 as seen on CT 10/23/2022. IMPRESSION: No acute radiographic abnormality in the abdomen. Acute or subacute compression  fracture of L2. Electronically Signed   By: Minerva Fester M.D.   On: 11/03/2022 03:20   DG Chest Portable 1 View  Result Date: 11/03/2022 CLINICAL DATA:  Vomiting EXAM: PORTABLE CHEST 1 VIEW COMPARISON:  Chest radiograph 12/20/2021 FINDINGS: Stable cardiomediastinal silhouette. Aortic atherosclerotic calcification. Bibasilar atelectasis. No focal consolidation, pleural effusion, or pneumothorax. No displaced rib fractures. IMPRESSION: No active disease. Electronically Signed  By: Minerva Fester M.D.   On: 11/03/2022 03:18   CT Lumbar Spine Wo Contrast  Addendum Date: 11/03/2022   ADDENDUM REPORT: 11/03/2022 00:00 ADDENDUM: These results were called by telephone at the time of interpretation on 11/02/2022 at 11:59 pm to provider [facility] , who verbally acknowledged these results. Electronically Signed   By: Helyn Numbers M.D.   On: 11/03/2022 00:00   Result Date: 11/03/2022 CLINICAL DATA:  Back trauma, no prior imaging (Age >= 16y), lumbosacral pain, unable to ambulate. EXAM: CT LUMBAR SPINE WITHOUT CONTRAST TECHNIQUE: Multidetector CT imaging of the lumbar spine was performed without intravenous contrast administration. Multiplanar CT image reconstructions were also generated. RADIATION DOSE REDUCTION: This exam was performed according to the departmental dose-optimization program which includes automated exposure control, adjustment of the mA and/or kV according to patient size and/or use of iterative reconstruction technique. COMPARISON:  None Available. FINDINGS: Segmentation: 5 lumbar type vertebrae. Alignment: Mild straightening of the lumbar spine.  No listhesis. Vertebrae: The osseous structures are diffusely osteopenic. There is an acute to subacute compression fracture of L2 with approximately 50% loss of height. The fracture plane is seen extending obliquely in the coronal plane and involving the anterior, inferior, and superior vertebral cortices. The posterior cortex appears intact  though there is minimal retropulsion of the posterosuperior aspect of the L2 vertebral body by approximately 3 mm. Posterior elements appear intact. Remaining vertebral body height appears preserved. Paraspinal and other soft tissues: Mild paravertebral infiltration noted at L2 in keeping with a trace amount of interstitial hemorrhage or edema. No paraspinal fluid collections. Atherosclerotic calcification noted within the abdominal aorta. Disc levels: Mild retropulsion of the posterosuperior aspect of L2 results in minimal canal stenosis but no significant neuroforaminal narrowing. Broad-based posterior disc bulge at L4-5 in combination with mild facet arthrosis and laminar hypertrophy results in mild central canal stenosis but no significant neuroforaminal narrowing. No high-grade canal stenosis or neuroforaminal narrowing identified. IMPRESSION: 1. Acute to subacute compression fracture of L2 with approximately 50% loss of height. Minimal retropulsion of the posterosuperior aspect of the L2 vertebral body by approximately 3 mm results in minimal canal stenosis but no significant neuroforaminal narrowing. 2. Diffuse osteopenia. 3. 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 4. Aortic atherosclerosis. Aortic Atherosclerosis (ICD10-I70.0). Electronically Signed: By: Helyn Numbers M.D. On: 11/02/2022 23:51   CT PELVIS WO CONTRAST  Result Date: 11/02/2022 CLINICAL DATA:  Hip trauma, fracture suspected. Increasing sacral pain and unwillingness to ambulate. EXAM: CT PELVIS WITHOUT CONTRAST TECHNIQUE: Multidetector CT imaging of the pelvis was performed following the standard protocol without intravenous contrast. RADIATION DOSE REDUCTION: This exam was performed according to the departmental dose-optimization program which includes automated exposure control, adjustment of the mA and/or kV according to patient size and/or use of iterative reconstruction  technique. COMPARISON:  None Available. FINDINGS: Urinary Tract:  No abnormality visualized. Bowel:  Unremarkable visualized pelvic bowel loops. Vascular/Lymphatic: Aortic atherosclerotic calcification. No lymphadenopathy. Reproductive:  Pessary.  No acute abnormality. Other:  None. Musculoskeletal: Demineralization. No acute fracture or dislocation. Degenerative changes both hips, pubic symphysis, and SI joints. IMPRESSION: 1. No acute fracture or dislocation. Aortic Atherosclerosis (ICD10-I70.0). Electronically Signed   By: Minerva Fester M.D.   On: 11/02/2022 23:45   CT Head Wo Contrast  Result Date: 11/02/2022 CLINICAL DATA:  Head trauma 4 days ago. Decreased appetite and weakness. Neck trauma. EXAM: CT HEAD WITHOUT CONTRAST CT CERVICAL SPINE WITHOUT CONTRAST TECHNIQUE: Multidetector CT  imaging of the head and cervical spine was performed following the standard protocol without intravenous contrast. Multiplanar CT image reconstructions of the cervical spine were also generated. RADIATION DOSE REDUCTION: This exam was performed according to the departmental dose-optimization program which includes automated exposure control, adjustment of the mA and/or kV according to patient size and/or use of iterative reconstruction technique. COMPARISON:  None Available. FINDINGS: CT HEAD FINDINGS Brain: No intracranial hemorrhage, mass effect, or evidence of acute infarct. No hydrocephalus. No extra-axial fluid collection. Generalized cerebral atrophy. Ill-defined hypoattenuation within the cerebral white matter is nonspecific but consistent with chronic small vessel ischemic disease. Vascular: No hyperdense vessel. Intracranial arterial calcification. Skull: No fracture or focal lesion. Sinuses/Orbits: No acute finding. Frothy secretions in the sphenoid sinuses. Mucosal thickening in the left maxillary sinus. No mastoid effusion. Other: None. CT CERVICAL SPINE FINDINGS Alignment: No evidence of traumatic malalignment.  Skull base and vertebrae: No acute fracture. No primary bone lesion or focal pathologic process. Soft tissues and spinal canal: No prevertebral fluid or swelling. No visible canal hematoma. Disc levels: Intervertebral disc space height is maintained. No severe spinal canal or neural foraminal narrowing. Upper chest: No acute abnormality. Other: Carotid calcification. IMPRESSION: 1. No acute intracranial abnormality. 2. No acute fracture in the cervical spine. Electronically Signed   By: Minerva Fester M.D.   On: 11/02/2022 23:40   CT Cervical Spine Wo Contrast  Result Date: 11/02/2022 CLINICAL DATA:  Head trauma 4 days ago. Decreased appetite and weakness. Neck trauma. EXAM: CT HEAD WITHOUT CONTRAST CT CERVICAL SPINE WITHOUT CONTRAST TECHNIQUE: Multidetector CT imaging of the head and cervical spine was performed following the standard protocol without intravenous contrast. Multiplanar CT image reconstructions of the cervical spine were also generated. RADIATION DOSE REDUCTION: This exam was performed according to the departmental dose-optimization program which includes automated exposure control, adjustment of the mA and/or kV according to patient size and/or use of iterative reconstruction technique. COMPARISON:  None Available. FINDINGS: CT HEAD FINDINGS Brain: No intracranial hemorrhage, mass effect, or evidence of acute infarct. No hydrocephalus. No extra-axial fluid collection. Generalized cerebral atrophy. Ill-defined hypoattenuation within the cerebral white matter is nonspecific but consistent with chronic small vessel ischemic disease. Vascular: No hyperdense vessel. Intracranial arterial calcification. Skull: No fracture or focal lesion. Sinuses/Orbits: No acute finding. Frothy secretions in the sphenoid sinuses. Mucosal thickening in the left maxillary sinus. No mastoid effusion. Other: None. CT CERVICAL SPINE FINDINGS Alignment: No evidence of traumatic malalignment. Skull base and vertebrae: No  acute fracture. No primary bone lesion or focal pathologic process. Soft tissues and spinal canal: No prevertebral fluid or swelling. No visible canal hematoma. Disc levels: Intervertebral disc space height is maintained. No severe spinal canal or neural foraminal narrowing. Upper chest: No acute abnormality. Other: Carotid calcification. IMPRESSION: 1. No acute intracranial abnormality. 2. No acute fracture in the cervical spine. Electronically Signed   By: Minerva Fester M.D.   On: 11/02/2022 23:40       Allan Bacigalupi M.D. [facility] 11/04/2022, 3:10 PM  Available via Epic secure chat 7am-7pm After 7 pm, please refer to night coverage provider listed on amion.

## 2022-11-04 NOTE — Evaluation (Signed)
Physical Therapy Evaluation Patient Details Name: Danielle Houston MRN: [id number] DOB: April 29, 1940 Today's Date: 11/04/2022  History of Present Illness  Patient is a 82 year old female who presented after a fall at home resulting in back pain and inability to ambulate. CT revealed sub acute compression fx of L2 with approximately 50% loss of height, 3mm retropulsion. PMH: CKD, hypothyroidism, normocytic anemia, HTN  Clinical Impression  Pt admitted with above diagnosis.  Pt currently with functional limitations due to the deficits listed below (see PT Problem List). Pt will benefit from acute skilled PT to increase their independence and safety with mobility to allow discharge.   The patient resting in bed, keeping right eye closed.  Patient reports dizziness. Unable to  determine cause of dizziness.  interpreter # [phone] utilized and patient did answer questions,  with some delay.. Patient reported"I am in agony." And that she fell because "My legs don't work right."  Patient did not mobilize  in bed this visit. No family present. Per patient , lives with daughter,who works.Patient will benefit from continued inpatient follow up therapy, <3 hours/day unless progresses well to return home.        If plan is discharge home, recommend the following: Two people to help with walking and/or transfers;A lot of help with bathing/dressing/bathroom;Assist for transportation;Help with stairs or ramp for entrance   Can travel by private vehicle   No    Equipment Recommendations Rolling walker (2 wheels)  Recommendations for Other Services       Functional Status Assessment Patient has had a recent decline in their functional status and demonstrates the ability to make significant improvements in function in a reasonable and predictable amount of time.     Precautions / Restrictions Precautions Precautions: Fall;Back Restrictions Weight Bearing Restrictions: No      Mobility  Bed Mobility                General bed mobility comments: elevated HOB to see if increased pain and patient  would attempt sitting, patient did not participate    Transfers                   General transfer comment: NT    Ambulation/Gait                  Stairs            Wheelchair Mobility     Tilt Bed    Modified Rankin (Stroke Patients Only)       Balance                                             Pertinent Vitals/Pain Pain Assessment Pain Assessment: Faces Faces Pain Scale: Hurts worst Pain Location: "i am suffering" Pain Descriptors / Indicators: Grimacing    Home Living Family/patient expects to be discharged to:: Private residence Living Arrangements: Children Available Help at Discharge: Family;Available PRN/intermittently Type of Home: unsure Home Access: Stairs to enter   Entrance Stairs-Number of Steps:      Home Equipment: None Additional Comments: patient is hard to communicate with with interpreter via phone call. patient reported living alone and having falls. patient reported "if i lived with family i woud not fall". patient reported " i have to walk about 5-6 steps" when asked if she has stairs to get into the house. patient reported she "  rents" when asking if she lives in house or apartment.    Prior Function                       Extremity/Trunk Assessment   Upper Extremity Assessment Upper Extremity Assessment: Generalized weakness (difficulty with communication to be able to test this. patient reporting she was in agony)    Lower Extremity Assessment Lower Extremity Assessment: RLE deficits/detail;LLE deficits/detail RLE Deficits / Details: pt. did not move the legs at all RLE: Unable to fully assess due to pain LLE: Unable to fully assess due to pain       Communication   Communication Communication:  (interpreter)  Cognition Arousal: Lethargic Behavior During Therapy: Flat  affect Overall Cognitive Status: Difficult to assess                                 General Comments: answers questions to inperpreter with delay, but appropriate        General Comments General comments (skin integrity, edema, etc.): Patient reported that she fell because "my legs don't work right."(per interpreter)    Exercises     Assessment/Plan    PT Assessment Patient needs continued PT services  PT Problem List Decreased strength;Decreased balance;Decreased activity tolerance;Decreased mobility;Pain;Decreased knowledge of precautions       PT Treatment Interventions DME instruction;Functional mobility training;Patient/family education;Gait training;Therapeutic activities;Therapeutic exercise;Cognitive remediation    PT Goals (Current goals can be found in the Care Plan section)  Acute Rehab PT Goals PT Goal Formulation: Patient unable to participate in goal setting Time For Goal Achievement: 11/18/22 Potential to Achieve Goals: Fair    Frequency Min 1X/week     Co-evaluation PT/OT/SLP Co-Evaluation/Treatment: Yes Reason for Co-Treatment: For patient/therapist safety;Necessary to address cognition/behavior during functional activity PT goals addressed during session: Mobility/safety with mobility OT goals addressed during session: ADL's and self-care       AM-PAC PT "6 Clicks" Mobility  Outcome Measure Help needed turning from your back to your side while in a flat bed without using bedrails?: Total Help needed moving from lying on your back to sitting on the side of a flat bed without using bedrails?: Total Help needed moving to and from a bed to a chair (including a wheelchair)?: Total Help needed standing up from a chair using your arms (e.g., wheelchair or bedside chair)?: Total Help needed to walk in hospital room?: Total Help needed climbing 3-5 steps with a railing? : Total 6 Click Score: 6    End of Session   Activity Tolerance:  Patient limited by pain;Patient limited by lethargy Patient left: in bed;with call bell/phone within reach;with bed alarm set Nurse Communication: Mobility status PT Visit Diagnosis: Unsteadiness on feet (R26.81);Dizziness and giddiness (R42)    Time: 9518-8416 PT Time Calculation (min) (ACUTE ONLY): 34 min   Charges:   PT Evaluation $PT Eval Low Complexity: 1 Low   PT General Charges $$ ACUTE PT VISIT: 1 Visit         Blanchard Kelch PT Acute Rehabilitation Services Office [phone] Weekend [phone]   Rada Hay 11/04/2022, 3:39 PM

## 2022-11-04 NOTE — TOC Initial Note (Signed)
Transition of Care [facility]) - Initial/Assessment Note   Patient Details  Name: Danielle Houston MRN: [id number] Date of Birth: [birth year]  Transition of [facility]) CM/SW Contact:    Ewing Schlein, LCSW Phone Number: 11/04/2022, 2:16 PM  Clinical Narrative: TOC following for discharge needs.                Expected Discharge Plan:  (TBD) Barriers to Discharge: Continued Medical Work up  Expected Discharge Plan and Services In-house Referral: Clinical Social Work Living arrangements for the past 2 months: Single Family Home  Prior Living Arrangements/Services Living arrangements for the past 2 months: Single Family Home Lives with:: Relatives Patient language and need for interpreter reviewed:: Yes Need for Family Participation in Patient Care: Yes (Comment) Care giver support system in place?: Yes (comment) Criminal Activity/Legal Involvement Pertinent to Current Situation/Hospitalization: No - Comment as needed  Activities of Daily Living Home Assistive Devices/Equipment: None ADL Screening (condition at time of admission) Patient's cognitive ability adequate to safely complete daily activities?: Yes Is the patient deaf or have difficulty hearing?: No Does the patient have difficulty seeing, even when wearing glasses/contacts?: No Does the patient have difficulty concentrating, remembering, or making decisions?: No Patient able to express need for assistance with ADLs?: No Does the patient have difficulty dressing or bathing?: Yes Independently performs ADLs?: No Communication: Needs assistance Is this a change from baseline?: Pre-admission baseline Dressing (OT): Needs assistance Is this a change from baseline?: Pre-admission baseline Grooming: Needs assistance Is this a change from baseline?: Pre-admission baseline Feeding: Needs assistance Is this a change from baseline?: Pre-admission baseline Bathing: Needs assistance Is this a change from baseline?: Pre-admission  baseline Toileting: Needs assistance Is this a change from baseline?: Pre-admission baseline In/Out Bed: Needs assistance Is this a change from baseline?: Pre-admission baseline Walks in Home: Needs assistance Is this a change from baseline?: Pre-admission baseline Does the patient have difficulty walking or climbing stairs?: No Weakness of Legs: Both Weakness of Arms/Hands: None  Emotional Assessment Orientation: : Oriented to Self, Oriented to Place, Oriented to  Time, Oriented to Situation Alcohol / Substance Use: Not Applicable Psych Involvement: No (comment)  Admission diagnosis:  Compression fracture of lumbar vertebra (HCC) [S32.000A] Fall, initial encounter [W19.XXXA] Compression fracture of L2 vertebra, initial encounter [facility]) [S32.020A] Patient Active Problem List   Diagnosis Date Noted   Nausea and vomiting 11/03/2022   Compression fracture of lumbar vertebra (HCC) 11/03/2022   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 [place] [address] Phone: [phone] Fax: [phone]  Social Determinants of Health (SDOH) Social History: SDOH Screenings   Food Insecurity: No Food Insecurity (11/03/2022)  Housing: Low Risk  (11/03/2022)  Transportation Needs: No Transportation Needs (11/03/2022)  Utilities: Not At Risk (11/03/2022)  Depression (PHQ2-9): High Risk (12/30/2021)  Tobacco Use: Low Risk  (11/03/2022)   SDOH Interventions:    Readmission Risk Interventions     No data to display

## 2022-11-04 NOTE — Consult Note (Signed)
Chief Complaint: Patient was seen in consultation today for L2 vertebral body augmentation/kyphoplasty Chief Complaint  Patient presents with   Fall    Referring Physician(s): Rai,R  Supervising Physician: Simonne Come  Patient Status: [facility] - In-pt  History of Present Illness: Danielle Houston is an 82 y.o. female with past medical history of coronary artery disease with prior CABG, hypertension, hypothyroidism who recently had a mechanical fall at home with subsequent persistent low back pain.  No radiation of pain down the legs, bowel bladder incontinence or focal weakness.  MRI revealed acute compression fracture of L2 with 60% vertebral body height loss and minimal retropulsion at the superior endplate. She has continued to have persistent mid to low back pain despite conservative measures/pain meds/neurontin. Request now received for L2 vertebral body augmentation.    Past Medical History:  Diagnosis Date   Coronary artery disease    Hypertension    Hypothyroidism     Past Surgical History:  Procedure Laterality Date   CARDIAC CATHETERIZATION     CORONARY ANGIOPLASTY      Allergies: Patient has no known allergies.  Medications: Prior to Admission medications   Medication Sig Start Date End Date Taking? Authorizing Provider  alendronate (FOSAMAX) 70 MG tablet Take 1 tablet (70 mg total) by mouth every 7 (seven) days. Take with a full glass of water on an empty stomach. 04/25/22  Yes Hoy Register, MD  allopurinol (ZYLOPRIM) 100 MG tablet Take 0.5 tablets (50 mg total) by mouth every other day. 05/22/22  Yes Hoy Register, MD  aspirin EC 81 MG tablet Take 1 tablet (81 mg total) by mouth daily. Swallow whole. 10/26/19  Yes Newlin, Odette Horns, MD  atorvastatin (LIPITOR) 40 MG tablet TAKE 1 TABLET (40 MG TOTAL) BY MOUTH DAILY. 05/22/22  Yes Newlin, Odette Horns, MD  carvedilol (COREG) 12.5 MG tablet TAKE 1 TABLET (12.5 MG TOTAL) BY MOUTH 2 (TWO) TIMES DAILY WITH A MEAL. 05/22/22  Yes  Newlin, Enobong, MD  hydrALAZINE (APRESOLINE) 100 MG tablet Take 1 tablet (100 mg total) by mouth 2 (two) times daily. 04/02/22  Yes Hoy Register, MD  isosorbide mononitrate (IMDUR) 120 MG 24 hr tablet Take 1 tablet (120 mg total) by mouth daily. 05/22/22  Yes Hoy Register, MD  amLODipine (NORVASC) 5 MG tablet Take 1 tablet (5 mg total) by mouth daily. Patient not taking: Reported on 11/04/2022 05/05/22   Hoy Register, MD  conjugated estrogens (PREMARIN) vaginal cream Place 0.5 g vaginally nightly for two weeks then twice a week thereafter Patient not taking: Reported on 11/04/2022 04/21/22   Selmer Dominion, NP  cyanocobalamin (VITAMIN B12) 1000 MCG tablet Take 1 tablet (1,000 mcg total) by mouth daily. Patient not taking: Reported on 11/04/2022 12/22/21   Elgergawy, Leana Roe, MD  diclofenac Sodium (VOLTAREN) 1 % GEL APPLY 2 Grams TOPICALLY 4 (FOUR) TIMES DAILY as needed for PAIN Patient not taking: Reported on 11/04/2022 04/02/22   Hoy Register, MD  lidocaine (LIDODERM) 5 % Place 1 patch onto the skin daily. Remove & Discard patch within 12 hours or as directed by MD Patient not taking: Reported on 11/04/2022 04/02/22   Hoy Register, MD  omeprazole (PRILOSEC) 20 MG capsule Take 1 capsule (20 mg total) by mouth 2 (two) times daily before a meal. Patient not taking: Reported on 11/04/2022 05/05/22   Hoy Register, MD  gabapentin (NEURONTIN) 300 MG capsule Take 1 capsule (300 mg total) by mouth at bedtime. 01/23/20 04/24/20  Hoy Register, MD  Family History  Problem Relation Age of Onset   CAD Neg Hx     Social History   Socioeconomic History   Marital status: Single    Spouse name: Not on file   Number of children: Not on file   Years of education: Not on file   Highest education level: Not on file  Occupational History   Not on file  Tobacco Use   Smoking status: Never   Smokeless tobacco: Never  Substance and Sexual Activity   Alcohol use: Never   Drug use: Never    Sexual activity: Not on file  Other Topics Concern   Not on file  Social History Narrative   Not on file   Social Determinants of Health   Financial Resource Strain: Not on file  Food Insecurity: No Food Insecurity (11/03/2022)   Hunger Vital Sign    Worried About Running Out of Food in the Last Year: Never true    Ran Out of Food in the Last Year: Never true  Transportation Needs: No Transportation Needs (11/03/2022)   PRAPARE - Administrator, Civil Service (Medical): No    Lack of Transportation (Non-Medical): No  Physical Activity: Not on file  Stress: Not on file  Social Connections: Not on file      Review of Systems: See above: Denies fever, headache, respiratory issues, nausea, vomiting or bleeding  Vital Signs: BP 136/72 (BP Location: Left Arm)   Pulse 67   Temp 99.2 F (37.3 C) (Oral)   Resp 15   Ht 5\' 2"  (1.575 m)   Wt 98 lb 12.3 oz (44.8 kg)   LMP  (LMP Unknown)   SpO2 98%   BMI 18.06 kg/m   Advance Care Plan: No documents on file   Physical Exam: Patient awake, alert.  Chest clear to auscultation bilaterally.  Heart with regular rate and rhythm.  Abdomen soft, positive bowel sounds, nontender.  No lower extremity edema.  Moderate paravertebral tenderness noted L2 region.  Imaging: MR THORACIC SPINE WO CONTRAST  Result Date: 11/03/2022 CLINICAL DATA:  Back trauma, abnormal neuro exam, CT or xray positive (Age >= 16y) EXAM: MRI THORACIC SPINE WITHOUT CONTRAST TECHNIQUE: Multiplanar, multisequence MR imaging of the thoracic spine was performed. No intravenous contrast was administered. COMPARISON:  MRI 08/02/2019 FINDINGS: Alignment:  No traumatic listhesis. 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 The degree of height loss has progressed since 2021. No bone marrow edema. Acute L2 compression fracture seen at the edge of the field of view, as described on dedicated same-day lumbar spine  MRI. No additional fractures. No evidence of discitis. No marrow replacing bone lesion. Cord:  Normal signal and morphology. Paraspinal and other soft tissues: Negative. Disc levels: Mild degenerative disc disease throughout the thoracic spine. Moderate multilevel facet joint arthropathy. No high-grade foraminal or canal stenosis at any level. IMPRESSION: 1. No acute fracture or traumatic subluxation of the thoracic spine. 2. 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 The degree of height loss has progressed since 2021 although there is no associated bone marrow edema to suggest recent progression. 3. Mild-to-moderate degenerative changes of the thoracic spine. No high-grade foraminal or canal stenosis at any level. 4. Acute L2 compression fracture seen at the edge of the field of view, as described on dedicated same-day lumbar spine MRI. Electronically Signed   By: Duanne Guess D.O.   On: 11/03/2022  16:48   MR LUMBAR SPINE WO CONTRAST  Result Date: 11/03/2022 CLINICAL DATA:  Back trauma, abnormal neuro exam, CT or xray positive (Age >= 16y) EXAM: MRI LUMBAR SPINE WITHOUT CONTRAST TECHNIQUE: Multiplanar, multisequence MR imaging of the lumbar spine was performed. No intravenous contrast was administered. COMPARISON:  CT 11/02/2022 FINDINGS: Segmentation:  Standard. Alignment:  Physiologic. Vertebrae: Acute compression fracture of the L2 vertebral body with approximately 60% vertebral body height loss. Associated bone marrow edema throughout the vertebral body. Minimal retropulsion at the superior endplate. Remaining lumbar vertebral body heights are maintained. No additional fractures. No evidence of discitis. No marrow replacing bone lesion. Conus medullaris and cauda equina: Conus extends to the L1 level. Conus and cauda equina appear normal. Paraspinal and other soft tissues: Negative. Disc levels: T12-L1: No significant disc protrusion, foraminal  stenosis, or canal stenosis. L1-L2: Mild retropulsion of the L2 superior endplate resulting in slight impress upon the ventral thecal sac and mild canal stenosis. No significant foraminal stenosis. L2-L3: No significant disc protrusion, foraminal stenosis, or canal stenosis. L3-L4: Minimal annular disc bulge and mild facet hypertrophy. Mild bilateral foraminal stenosis. No canal stenosis. L4-L5: Minimal annular disc bulge with mild facet hypertrophy. Mild bilateral foraminal stenosis. No canal stenosis. L5-S1: Minimal annular disc bulge with mild facet hypertrophy. Borderline-mild left foraminal stenosis. No canal stenosis. IMPRESSION: 1. Acute compression fracture of the L2 vertebral body with approximately 60% vertebral body height loss. Minimal retropulsion at the superior endplate resulting in mild canal stenosis. 2. Mild degenerative changes of the lumbar spine with mild bilateral foraminal stenosis at L3-L4 and L4-L5. Electronically Signed   By: Duanne Guess D.O.   On: 11/03/2022 16:41   DG Abd Portable 2 Views  Result Date: 11/03/2022 CLINICAL DATA:  Vomiting EXAM: PORTABLE ABDOMEN - 2 VIEW COMPARISON:  Same day CT pelvis FINDINGS: The bowel gas pattern is normal. Moderate colonic stool load. There is no evidence of free air. Redemonstrated compression fracture of L2 as seen on CT 10/23/2022. IMPRESSION: No acute radiographic abnormality in the abdomen. Acute or subacute compression fracture of L2. Electronically Signed   By: Minerva Fester M.D.   On: 11/03/2022 03:20   DG Chest Portable 1 View  Result Date: 11/03/2022 CLINICAL DATA:  Vomiting EXAM: PORTABLE CHEST 1 VIEW COMPARISON:  Chest radiograph 12/20/2021 FINDINGS: Stable cardiomediastinal silhouette. Aortic atherosclerotic calcification. Bibasilar atelectasis. No focal consolidation, pleural effusion, or pneumothorax. No displaced rib fractures. IMPRESSION: No active disease. Electronically Signed   By: Minerva Fester M.D.   On:  11/03/2022 03:18   CT Lumbar Spine Wo Contrast  Addendum Date: 11/03/2022   ADDENDUM REPORT: 11/03/2022 00:00 ADDENDUM: These results were called by telephone at the time of interpretation on 11/02/2022 at 11:59 pm to provider [facility] , who verbally acknowledged these results. Electronically Signed   By: Helyn Numbers M.D.   On: 11/03/2022 00:00   Result Date: 11/03/2022 CLINICAL DATA:  Back trauma, no prior imaging (Age >= 16y), lumbosacral pain, unable to ambulate. EXAM: CT LUMBAR SPINE WITHOUT CONTRAST TECHNIQUE: Multidetector CT imaging of the lumbar spine was performed without intravenous contrast administration. Multiplanar CT image reconstructions were also generated. RADIATION DOSE REDUCTION: This exam was performed according to the departmental dose-optimization program which includes automated exposure control, adjustment of the mA and/or kV according to patient size and/or use of iterative reconstruction technique. COMPARISON:  None Available. FINDINGS: Segmentation: 5 lumbar type vertebrae. Alignment: Mild straightening of the lumbar spine.  No listhesis. Vertebrae: The osseous structures are  diffusely osteopenic. There is an acute to subacute compression fracture of L2 with approximately 50% loss of height. The fracture plane is seen extending obliquely in the coronal plane and involving the anterior, inferior, and superior vertebral cortices. The posterior cortex appears intact though there is minimal retropulsion of the posterosuperior aspect of the L2 vertebral body by approximately 3 mm. Posterior elements appear intact. Remaining vertebral body height appears preserved. Paraspinal and other soft tissues: Mild paravertebral infiltration noted at L2 in keeping with a trace amount of interstitial hemorrhage or edema. No paraspinal fluid collections. Atherosclerotic calcification noted within the abdominal aorta. Disc levels: Mild retropulsion of the posterosuperior aspect of L2 results  in minimal canal stenosis but no significant neuroforaminal narrowing. Broad-based posterior disc bulge at L4-5 in combination with mild facet arthrosis and laminar hypertrophy results in mild central canal stenosis but no significant neuroforaminal narrowing. No high-grade canal stenosis or neuroforaminal narrowing identified. IMPRESSION: 1. Acute to subacute compression fracture of L2 with approximately 50% loss of height. Minimal retropulsion of the posterosuperior aspect of the L2 vertebral body by approximately 3 mm results in minimal canal stenosis but no significant neuroforaminal narrowing. 2. Diffuse osteopenia. 3. 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 4. Aortic atherosclerosis. Aortic Atherosclerosis (ICD10-I70.0). Electronically Signed: By: Helyn Numbers M.D. On: 11/02/2022 23:51   CT PELVIS WO CONTRAST  Result Date: 11/02/2022 CLINICAL DATA:  Hip trauma, fracture suspected. Increasing sacral pain and unwillingness to ambulate. EXAM: CT PELVIS WITHOUT CONTRAST TECHNIQUE: Multidetector CT imaging of the pelvis was performed following the standard protocol without intravenous contrast. RADIATION DOSE REDUCTION: This exam was performed according to the departmental dose-optimization program which includes automated exposure control, adjustment of the mA and/or kV according to patient size and/or use of iterative reconstruction technique. COMPARISON:  None Available. FINDINGS: Urinary Tract:  No abnormality visualized. Bowel:  Unremarkable visualized pelvic bowel loops. Vascular/Lymphatic: Aortic atherosclerotic calcification. No lymphadenopathy. Reproductive:  Pessary.  No acute abnormality. Other:  None. Musculoskeletal: Demineralization. No acute fracture or dislocation. Degenerative changes both hips, pubic symphysis, and SI joints. IMPRESSION: 1. No acute fracture or dislocation. Aortic Atherosclerosis (ICD10-I70.0). Electronically  Signed   By: Minerva Fester M.D.   On: 11/02/2022 23:45   CT Head Wo Contrast  Result Date: 11/02/2022 CLINICAL DATA:  Head trauma 4 days ago. Decreased appetite and weakness. Neck trauma. EXAM: CT HEAD WITHOUT CONTRAST CT CERVICAL SPINE WITHOUT CONTRAST TECHNIQUE: Multidetector CT imaging of the head and cervical spine was performed following the standard protocol without intravenous contrast. Multiplanar CT image reconstructions of the cervical spine were also generated. RADIATION DOSE REDUCTION: This exam was performed according to the departmental dose-optimization program which includes automated exposure control, adjustment of the mA and/or kV according to patient size and/or use of iterative reconstruction technique. COMPARISON:  None Available. FINDINGS: CT HEAD FINDINGS Brain: No intracranial hemorrhage, mass effect, or evidence of acute infarct. No hydrocephalus. No extra-axial fluid collection. Generalized cerebral atrophy. Ill-defined hypoattenuation within the cerebral white matter is nonspecific but consistent with chronic small vessel ischemic disease. Vascular: No hyperdense vessel. Intracranial arterial calcification. Skull: No fracture or focal lesion. Sinuses/Orbits: No acute finding. Frothy secretions in the sphenoid sinuses. Mucosal thickening in the left maxillary sinus. No mastoid effusion. Other: None. CT CERVICAL SPINE FINDINGS Alignment: No evidence of traumatic malalignment. Skull base and vertebrae: No acute fracture. No primary bone lesion or focal pathologic process. Soft tissues and spinal canal: No prevertebral fluid or  swelling. No visible canal hematoma. Disc levels: Intervertebral disc space height is maintained. No severe spinal canal or neural foraminal narrowing. Upper chest: No acute abnormality. Other: Carotid calcification. IMPRESSION: 1. No acute intracranial abnormality. 2. No acute fracture in the cervical spine. Electronically Signed   By: Minerva Fester M.D.   On:  11/02/2022 23:40   CT Cervical Spine Wo Contrast  Result Date: 11/02/2022 CLINICAL DATA:  Head trauma 4 days ago. Decreased appetite and weakness. Neck trauma. EXAM: CT HEAD WITHOUT CONTRAST CT CERVICAL SPINE WITHOUT CONTRAST TECHNIQUE: Multidetector CT imaging of the head and cervical spine was performed following the standard protocol without intravenous contrast. Multiplanar CT image reconstructions of the cervical spine were also generated. RADIATION DOSE REDUCTION: This exam was performed according to the departmental dose-optimization program which includes automated exposure control, adjustment of the mA and/or kV according to patient size and/or use of iterative reconstruction technique. COMPARISON:  None Available. FINDINGS: CT HEAD FINDINGS Brain: No intracranial hemorrhage, mass effect, or evidence of acute infarct. No hydrocephalus. No extra-axial fluid collection. Generalized cerebral atrophy. Ill-defined hypoattenuation within the cerebral white matter is nonspecific but consistent with chronic small vessel ischemic disease. Vascular: No hyperdense vessel. Intracranial arterial calcification. Skull: No fracture or focal lesion. Sinuses/Orbits: No acute finding. Frothy secretions in the sphenoid sinuses. Mucosal thickening in the left maxillary sinus. No mastoid effusion. Other: None. CT CERVICAL SPINE FINDINGS Alignment: No evidence of traumatic malalignment. Skull base and vertebrae: No acute fracture. No primary bone lesion or focal pathologic process. Soft tissues and spinal canal: No prevertebral fluid or swelling. No visible canal hematoma. Disc levels: Intervertebral disc space height is maintained. No severe spinal canal or neural foraminal narrowing. Upper chest: No acute abnormality. Other: Carotid calcification. IMPRESSION: 1. No acute intracranial abnormality. 2. No acute fracture in the cervical spine. Electronically Signed   By: Minerva Fester M.D.   On: 11/02/2022 23:40     Labs:  CBC: Recent Labs    12/22/21 1233 11/02/22 2227 11/03/22 0553 11/04/22 0321  WBC 4.1 6.5 6.5 5.2  HGB 8.6* 8.6* 8.4* 8.8*  HCT 25.0* 26.6* 26.1* 28.0*  PLT 125* 144* 143* 138*    COAGS: No results for input(s): "INR", "APTT" in the last 8760 hours.  BMP: Recent Labs    12/22/21 1233 12/30/21 1052 04/02/22 1054 11/02/22 2227 11/03/22 0553 11/04/22 0321  NA 129*   < > 142 138 137 137  K 3.6   < > 5.7* 4.4 4.5 3.9  CL 101   < > 106 111 108 107  CO2 21*   < > 22 20* 20* 23  GLUCOSE 118*   < > 123* 122* 107* 107*  BUN 23   < > 54* 59* 58* 63*  CALCIUM 8.5*   < > 9.6 8.4* 8.2* 8.1*  CREATININE 1.78*   < > 2.40* 2.18* 2.13* 2.46*  GFRNONAA 28*  --   --  22* 23* 19*   < > = values in this interval not displayed.    LIVER FUNCTION TESTS: Recent Labs    12/20/21 1339 04/02/22 1054 11/02/22 2227  BILITOT 0.6 0.3 0.8  AST 34 22 17  ALT 14 18 14   ALKPHOS 72 128* 86  PROT 7.0 7.5 7.3  ALBUMIN 3.9 4.2 3.5    TUMOR MARKERS: No results for input(s): "AFPTM", "CEA", "CA199", "CHROMGRNA" in the last 8760 hours.  Assessment and Plan: 82 y.o. female with past medical history of coronary artery disease with prior CABG,  hypertension, hypothyroidism who recently had a mechanical fall at home with subsequent persistent low back pain.  No radiation of pain down the legs, bowel bladder incontinence or focal weakness.  MRI revealed acute compression fracture of L2 with 60% vertebral body height loss and minimal retropulsion at the superior endplate.She has continued to have persistent mid to low back pain despite conservative measures/pain meds/neurontin. Request now received for L2 vertebral body augmentation.  Imaging studies have been reviewed by Dr. Milford Cage and patient appears to be candidate for L2 kyphoplasty.  Risks and benefits of  were discussed with the patient/son/granddaughter including, but not limited to education regarding the natural healing process of  compression fractures without intervention, bleeding, infection, cement migration which may cause spinal cord damage, paralysis, pulmonary embolism or even death.  This interventional procedure involves the use of X-rays and because of the nature of the planned procedure, it is possible that we will have prolonged use of X-ray fluoroscopy.  Potential radiation risks to you include (but are not limited to) the following: - A slightly elevated risk for cancer  several years later in life. This risk is typically less than 0.5% percent. This risk is low in comparison to the normal incidence of human cancer, which is 33% for women and 50% for men according to the [employer] Society. - Radiation induced injury can include skin redness, resembling a rash, tissue breakdown / ulcers and hair loss (which can be temporary or permanent).   The likelihood of either of these occurring depends on the difficulty of the procedure and whether you are sensitive to radiation due to previous procedures, disease, or genetic conditions.   IF your procedure requires a prolonged use of radiation, you will be notified and given written instructions for further action.  It is your responsibility to monitor the irradiated area for the 2 weeks following the procedure and to notify your physician if you are concerned that you have suffered a radiation induced injury.    All of the patient's questions were answered, patient is agreeable to proceed.  Consent signed and in chart.  Procedure tentatively scheduled for 8/21    Thank you for this interesting consult.  I greatly enjoyed meeting Jeanelly Keister and look forward to participating in their care.  A copy of this report was sent to the requesting provider on this date.  Electronically Signed: D. Jeananne Rama, PA-C 11/04/2022, 4:11 PM   I spent a total of  25 minutes   in face to face in clinical consultation, greater than 50% of which was counseling/coordinating  care for L2 vertebral body augmentation/kyphoplasty

## 2022-11-05 ENCOUNTER — Inpatient Hospital Stay (HOSPITAL_COMMUNITY): Payer: Medicaid Other

## 2022-11-05 DIAGNOSIS — S32020A Wedge compression fracture of second lumbar vertebra, initial encounter for closed fracture: Secondary | ICD-10-CM | POA: Diagnosis not present

## 2022-11-05 HISTORY — PX: IR KYPHO LUMBAR INC FX REDUCE BONE BX UNI/BIL CANNULATION INC/IMAGING: IMG5519

## 2022-11-05 LAB — CBC
HCT: 29.3 % — ABNORMAL LOW (ref 36.0–46.0)
Hemoglobin: 9.7 g/dL — ABNORMAL LOW (ref 12.0–15.0)
MCH: 31.6 pg (ref 26.0–34.0)
MCHC: 33.1 g/dL (ref 30.0–36.0)
MCV: 95.4 fL (ref 80.0–100.0)
Platelets: 173 10*3/uL (ref 150–400)
RBC: 3.07 MIL/uL — ABNORMAL LOW (ref 3.87–5.11)
RDW: 11.9 % (ref 11.5–15.5)
WBC: 6 10*3/uL (ref 4.0–10.5)
nRBC: 0 % (ref 0.0–0.2)

## 2022-11-05 LAB — BASIC METABOLIC PANEL
Anion gap: 10 (ref 5–15)
BUN: 39 mg/dL — ABNORMAL HIGH (ref 8–23)
CO2: 22 mmol/L (ref 22–32)
Calcium: 8.7 mg/dL — ABNORMAL LOW (ref 8.9–10.3)
Chloride: 105 mmol/L (ref 98–111)
Creatinine, Ser: 1.83 mg/dL — ABNORMAL HIGH (ref 0.44–1.00)
GFR, Estimated: 27 mL/min — ABNORMAL LOW (ref >60)
Glucose, Bld: 118 mg/dL — ABNORMAL HIGH (ref 70–99)
Potassium: 4 mmol/L (ref 3.5–5.1)
Sodium: 137 mmol/L (ref 135–145)

## 2022-11-05 LAB — PROTIME-INR
INR: 1 (ref 0.8–1.2)
Prothrombin Time: 13.8 s (ref 11.4–15.2)

## 2022-11-05 MED ORDER — MIDAZOLAM HCL 2 MG/2ML IJ SOLN
INTRAMUSCULAR | Status: AC | PRN
  Administered 2022-11-05 (×2): .5 mg via INTRAVENOUS

## 2022-11-05 MED ORDER — IOHEXOL 300 MG/ML  SOLN
50.0000 mL | Freq: Once | INTRAMUSCULAR | Status: AC | PRN
  Administered 2022-11-05: 1 mL

## 2022-11-05 MED ORDER — FENTANYL CITRATE (PF) 100 MCG/2ML IJ SOLN
INTRAMUSCULAR | Status: AC
  Filled 2022-11-05: qty 2

## 2022-11-05 MED ORDER — LIDOCAINE HCL (PF) 1 % IJ SOLN
30.0000 mL | Freq: Once | INTRAMUSCULAR | Status: AC
  Administered 2022-11-05: 10 mL via INTRADERMAL

## 2022-11-05 MED ORDER — HYOSCYAMINE SULFATE 0.125 MG SL SUBL: 0.1250 mg | SUBLINGUAL_TABLET | Freq: Four times a day (QID) | SUBLINGUAL | Status: DC | PRN

## 2022-11-05 MED ORDER — SENNOSIDES-DOCUSATE SODIUM 8.6-50 MG PO TABS
1.0000 | ORAL_TABLET | Freq: Two times a day (BID) | ORAL | Status: DC
  Administered 2022-11-05 – 2022-11-07 (×4): 1 via ORAL
  Filled 2022-11-05 (×5): qty 1

## 2022-11-05 MED ORDER — LIDOCAINE HCL (PF) 1 % IJ SOLN
INTRAMUSCULAR | Status: AC
  Filled 2022-11-05: qty 30

## 2022-11-05 MED ORDER — KETOROLAC TROMETHAMINE 30 MG/ML IJ SOLN
INTRAMUSCULAR | Status: AC | PRN
  Administered 2022-11-05: 30 mg via INTRAVENOUS

## 2022-11-05 MED ORDER — FENTANYL CITRATE (PF) 100 MCG/2ML IJ SOLN
INTRAMUSCULAR | Status: AC | PRN
  Administered 2022-11-05: 25 ug via INTRAVENOUS
  Administered 2022-11-05: 50 ug via INTRAVENOUS

## 2022-11-05 MED ORDER — KETOROLAC TROMETHAMINE 30 MG/ML IJ SOLN
INTRAMUSCULAR | Status: AC
  Filled 2022-11-05: qty 1

## 2022-11-05 MED ORDER — MIDAZOLAM HCL 2 MG/2ML IJ SOLN
INTRAMUSCULAR | Status: AC
  Filled 2022-11-05: qty 4

## 2022-11-05 NOTE — Progress Notes (Signed)
OT Cancellation Note  Patient Details Name: Danielle Houston MRN: [id number] DOB: [birth year]   Cancelled Treatment:    Reason Eval/Treat Not Completed: Pain limiting ability to participate;Patient at procedure or test/ unavailable Patient plan to have IR procedure this AM. OT to continue to follow and check back after procedure to see if patient is more agreeable to participate in session. Kirt Boys OTR/L, MS Acute Rehabilitation Department Office# [phone]  11/05/2022, 8:42 AM

## 2022-11-05 NOTE — Hospital Course (Addendum)
Brief hospital course: PMH of  HTN, hypothyroidism, CAD s/p CABG. presents to the hospital with complaints of fall found to have lumbar compression fracture with retropulsion.  IR was consulted underwent kyphoplasty.  Now awaiting SNF placement. Intermittently complaining of abdominal pain and urinary retention. Has been medically stable since 8/22.  Currently awaiting placement. Likely discharge to SNF tomorrow.  Assessment and Plan: Fall, intractable pain, compression fracture of lumbar vertebra CT lumbar spine showed acute to subacute compression fracture L2 with approximately 50% loss of height, 3 mm retropulsion EDP discussed with neurosurgery NP, recommendations nonsurgical, TLSO, PT and pain control Patient had intractable back pain, MRI L-spine showed acute compression fracture of L2 vertebral body with 60% vertebral body height loss, minimal retropulsion at the superior endplate resulting in mild canal stenosis  Continue pain control, added oxycodone 5 mg every 4 hours as needed, Neurontin 100 mg twice daily, titrate up as needed.  Continue IV fentanyl for severe pain. Underwent kyphoplasty on 8/21.   Nausea and vomiting Abdominal pain. Patient was noted to have nausea and vomiting after morphine in ED.  Did not have any nausea or vomiting before admission KUB with nonobstructive pattern Continue antiemetics as needed, soft diet  Urinary retention. Required In-N-Out catheterization x 2. No further retention.  Chest pain. Reported chest pain on 8/24. EKG unremarkable. Troponin ordered. Check chest x-ray. Examination unremarkable. Monitor.  Pain resolved.   Vitamin D deficiency Vitamin D level 26.8, will place on vitamin D replacement   Normocytic anemia Baseline hemoglobin 7-8, likely anemia of chronic disease due to CKD stage IV H&H currently close to baseline   Hypothyroidism Reported history. Does not appear to be on Synthroid.  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  Improved after hydration.  Normocytic anemia. Most likely anemia of chronic disease. Serum creatinine has been elevated chronically and hemoglobin has been stable around 8. Drop down to 7.2 for some reason on 8/26 most likely lab error. Repeat hemoglobin is stable. Monitor. Will check B12 folic acid tomorrow.

## 2022-11-05 NOTE — TOC Progression Note (Signed)
Transition of Care [facility]) - Progression Note   Patient Details  Name: Nixon Rulli MRN: [id number] Date of Birth: 1940/09/23  Transition of Care [facility]) CM/SW Contact  Ewing Schlein, LCSW Phone Number: 11/05/2022, 11:35 AM  Clinical Narrative: CSW spoke with granddaughter and patient and family are agreeable to patient being faxed out for rehab. FL2 done; PASRR received. Initial referral faxed out. TOC awaiting bed offers.  Expected Discharge Plan: Skilled Nursing Facility Barriers to Discharge: Continued Medical Work up, SNF Pending bed offer, English as a second language teacher  Expected Discharge Plan and Services In-house Referral: Clinical Social Work Living arrangements for the past 2 months: Single Family Home  Social Determinants of Health (SDOH) Interventions SDOH Screenings   Food Insecurity: No Food Insecurity (11/03/2022)  Housing: Low Risk  (11/03/2022)  Transportation Needs: No Transportation Needs (11/03/2022)  Utilities: Not At Risk (11/03/2022)  Depression (PHQ2-9): High Risk (12/30/2021)  Tobacco Use: Low Risk  (11/03/2022)   Readmission Risk Interventions     No data to display

## 2022-11-05 NOTE — Progress Notes (Signed)
[facility] Progress Note Patient: Danielle Houston QIH:[id number] DOB: [birth year] DOA: 11/02/2022  DOS: the patient was seen and examined on 11/05/2022  Brief hospital course: PMH of  HTN, hypothyroidism, CAD s/p CABG. presents to the hospital with complaints of fall found to have lumbar compression fracture with retropulsion.  IR was consulted underwent kyphoplasty.  Now awaiting SNF placement. Intermittently complaining of abdominal pain and urinary retention. Assessment and Plan: Fall, intractable pain, compression fracture of lumbar vertebra CT lumbar spine showed acute to subacute compression fracture L2 with approximately 50% loss of height, 3 mm retropulsion EDP discussed with neurosurgery NP, recommendations nonsurgical, TLSO, PT and pain control Patient had intractable back pain, MRI L-spine showed acute compression fracture of L2 vertebral body with 60% vertebral body height loss, minimal retropulsion at the superior endplate resulting in mild canal stenosis  Continue pain control, added oxycodone 5 mg every 4 hours as needed, Neurontin 100 mg twice daily, titrate up as needed.  Continue IV fentanyl for severe pain. Underwent kyphoplasty on 8/21.   Nausea and vomiting Abdominal pain. Patient was noted to have nausea and vomiting after morphine in ED.  Did not have any nausea or vomiting before admission KUB with nonobstructive pattern Continue antiemetics as needed, soft diet  Urinary retention. Requiring In-N-Out catheterization x 2. If further retention would perform Foley catheter insertion for now.   Vitamin D deficiency Vitamin D level 26.8, will place on vitamin D replacement   Normocytic anemia Baseline hemoglobin 7-8, likely anemia of chronic disease due to CKD stage IV H&H currently close to baseline   Hypothyroidism Reported history. Does not appear to be on Synthroid.  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  Monitor improving with hydration.   Subjective: Pain still present in the back.  Pain still present in the abdomen as well.  No nausea no vomiting.  Minimal oral intake.  Physical Exam: General: in Mild distress, No Rash, oral mucosa dry Cardiovascular: S1 and S2 Present, No Murmur Respiratory: Good respiratory effort, Bilateral Air entry present. No Crackles, No wheezes Abdomen: Bowel Sound present, midline abdominal tenderness Extremities: No edema Neuro: Alert and oriented x3, no new focal deficit  Data Reviewed: I have Reviewed nursing notes, Vitals, and Lab results. Since last encounter, pertinent lab results CBC and BMP   . I have ordered test including CBC and BMP  .   Disposition: Status is: Inpatient Remains inpatient appropriate because: Pain control  enoxaparin (LOVENOX) injection 30 mg Start: 11/06/22 1400   Family Communication: Granddaughter at bedside helped translate. Level of care: Med-Surg   Vitals:   11/05/22 1015 11/05/22 1020 11/05/22 1120 11/05/22 1306  BP: 101/62 (!) 98/54 (!) 146/71 (!) 173/75  Pulse: 60 (!) 59 (!) 56 62  Resp: (!) 9 (!) 6 20 16   Temp:   97.8 F (36.6 C) 97.6 F (36.4 C)  TempSrc:   Axillary   SpO2: 98% 99% 97% 98%  Weight:      Height:         Author: Lynden Oxford, MD 11/05/2022 5:13 PM  Please look on [email or web address] to find out who is on call.

## 2022-11-05 NOTE — Plan of Care (Signed)
  Problem: Education: Goal: Knowledge of General Education information will improve Description: Including pain rating scale, medication(s)/side effects and non-pharmacologic comfort measures Outcome: Progressing   Problem: Activity: Goal: Risk for activity intolerance will decrease Outcome: Progressing   Problem: Pain Managment: Goal: General experience of comfort will improve Outcome: Progressing   

## 2022-11-05 NOTE — Plan of Care (Signed)

## 2022-11-05 NOTE — Procedures (Signed)
Pre procedural Dx: Symptomatic compression fracture Post procedural Dx: Same  Technically successful cement augmentation of the L2 vertebral body. Patient is to lie flat for 3 hours (until 1330)  EBL: Trace Complications: None immediate.  Katherina Right, MD Pager #: [phone]

## 2022-11-05 NOTE — Progress Notes (Addendum)
MEDICATION-RELATED CONSULT NOTE   IR Procedure Consult - Anticoagulant/Antiplatelet PTA/Inpatient Med List Review by Pharmacist    Procedure: IR L2 vertebral body augmentation/kyphoplasty   Completed: 11/05/22  Post-Procedural bleeding risk per IR MD assessment:  Standard Bleeding Risk  Antithrombotic medications on inpatient or PTA profile prior to procedure:    - Aspirin EC 81mg  daily - PTA (not ordered inpatient) - Enoxaparin 30mg  SQ every 24 hours for DVT prophylaxis (last dose: 8/20 @ 1359)  Recommended restart time per IR Post-Procedure Guidelines:   -  Restart LMWH (Lovenox) POD1 (8/22)  Plan:     - Darrell Allred, PA, has ordered Lovenox 30mg  sq every 24 hours to be resumed on 8/22 at 1400. -  Will defer restarting PTA Aspirin to MD- contacted Dr. Allena Katz to notify him patient was on aspirin PTA.

## 2022-11-05 NOTE — NC FL2 (Signed)
[place] MEDICAID FL2 LEVEL OF CARE FORM     IDENTIFICATION  Patient Name: Clarkie Cruson Birthdate: [birth year] Sex: female Admission Date (Current Location): 11/02/2022  [place] and [place] Number:  Haynes Bast [id number] L Facility and Address:  [facility],  [address], [place] [address]      Provider Number: [id number]  Attending Physician Name and Address:  Rolly Salter, MD  Relative Name and Phone Number:  Cephus Slater (son) Ph: [phone]    Current Level of Care: Hospital Recommended Level of Care: Skilled Nursing Facility Prior Approval Number:    Date Approved/Denied:   PASRR Number: [id number]  Discharge Plan: SNF    Current Diagnoses: Patient Active Problem List   Diagnosis Date Noted   Nausea and vomiting 11/03/2022   Compression fracture of lumbar vertebra (HCC) 11/03/2022   Other osteoporosis without current pathological fracture 04/25/2022   Overactive bladder 12/18/2021   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) 06/06/2021   Essential hypertension 04/04/2021   Low back pain 08/03/2019   CAD (coronary artery disease) 08/03/2019   Anemia 08/03/2019   Hypothyroidism 08/03/2019    Orientation RESPIRATION BLADDER Height & Weight     Self, Time, Situation, Place  Normal Incontinent Weight: 98 lb 12.3 oz (44.8 kg) Height:  5\' 2"  (157.5 cm)  BEHAVIORAL SYMPTOMS/MOOD NEUROLOGICAL BOWEL NUTRITION STATUS   (N/A)  (N/A) Continent Diet (See discharge summary)  AMBULATORY STATUS COMMUNICATION OF NEEDS Skin   Extensive Assist Verbally (Patient speaks a [place] dialect Journalist, newspaper).) Normal                       Personal Care Assistance Level of Assistance  Bathing, Feeding, Dressing Bathing Assistance: Limited assistance Feeding assistance: Independent Dressing Assistance: Limited assistance     Functional Limitations Info  Sight, Hearing, Speech Sight Info: Adequate Hearing Info: Adequate Speech Info:  Adequate    SPECIAL CARE FACTORS FREQUENCY  PT (By licensed PT), OT (By licensed OT)     PT Frequency: 5x's/week OT Frequency: 5x's/week            Contractures Contractures Info: Not present    Additional Factors Info  Code Status, Allergies Code Status Info: Full Allergies Info: NKA           Current Medications (11/05/2022):  This is the current hospital active medication list Current Facility-Administered Medications  Medication Dose Route Frequency Provider Last Rate Last Admin   acetaminophen (TYLENOL) tablet 650 mg  650 mg Oral Q6H PRN Hillary Bow, DO   650 mg at 11/04/22 8413   Or   acetaminophen (TYLENOL) suppository 650 mg  650 mg Rectal Q6H PRN Hillary Bow, DO       allopurinol (ZYLOPRIM) tablet 50 mg  50 mg Oral QODAY Rai, Ripudeep K, MD   50 mg at 11/04/22 1359   atorvastatin (LIPITOR) tablet 40 mg  40 mg Oral Daily Rai, Ripudeep K, MD   40 mg at 11/04/22 1359   carvedilol (COREG) tablet 12.5 mg  12.5 mg Oral BID WC Rai, Ripudeep K, MD   12.5 mg at 11/04/22 1736   [START ON 11/06/2022] enoxaparin (LOVENOX) injection 30 mg  30 mg Subcutaneous Q24H Allred, Darrell K, PA-C       fentaNYL (SUBLIMAZE) injection 25 mcg  25 mcg Intravenous Q2H PRN Hillary Bow, DO   25 mcg at 11/04/22 2300   gabapentin (NEURONTIN) capsule 100 mg  100 mg Oral BID Rai, Ripudeep  K, MD   100 mg at 11/04/22 2258   hydrALAZINE (APRESOLINE) injection 10 mg  10 mg Intravenous Q6H PRN Rai, Ripudeep K, MD       hydrALAZINE (APRESOLINE) tablet 100 mg  100 mg Oral BID Rai, Ripudeep K, MD   100 mg at 11/04/22 2258   isosorbide mononitrate (IMDUR) 24 hr tablet 120 mg  120 mg Oral Daily Rai, Ripudeep K, MD       lactated ringers infusion   Intravenous Continuous Rai, Ripudeep K, MD 100 mL/hr at 11/05/22 0505 New Bag at 11/05/22 0505   ondansetron (ZOFRAN) injection 4 mg  4 mg Intravenous Q6H PRN Hillary Bow, DO       oxyCODONE (Oxy IR/ROXICODONE) immediate release tablet 5 mg  5 mg  Oral Q4H PRN Rai, Ripudeep K, MD       pantoprazole (PROTONIX) EC tablet 40 mg  40 mg Oral Daily Rai, Ripudeep K, MD   40 mg at 11/04/22 1012   Vitamin D (Ergocalciferol) (DRISDOL) 1.25 MG (50000 UNIT) capsule 50,000 Units  50,000 Units Oral Q7 days Cathren Harsh, MD   50,000 Units at 11/04/22 1736     Discharge Medications: Please see discharge summary for a list of discharge medications.  Relevant Imaging Results:  Relevant Lab Results:   Additional Information SSN: [id number]  Ewing Schlein, LCSW

## 2022-11-06 NOTE — Progress Notes (Signed)
[facility] PROGRESS NOTE  Patient: Danielle Houston ZOX:[id number]   PCP: Hoy Register, MD DOB: 01-31-1941   DOA: 11/02/2022   DOS: 11/06/2022    Subjective: No events overnight.  Able to urinate without any issue.  Had a BM as well.  Objective:  Vitals:   11/05/22 1120 11/05/22 1306 11/05/22 2206 11/06/22 0530  BP: (!) 146/71 (!) 173/75 (!) 102/58 (!) 173/71  Pulse: (!) 56 62 73 68  Resp: 20 16 15 15   Temp: 97.8 F (36.6 C) 97.6 F (36.4 C) 99.3 F (37.4 C) 98.2 F (36.8 C)  TempSrc: Axillary  Oral Oral  SpO2: 97% 98% 98% 98%  Weight:      Height:       Assessment and plan: L2 compression fracture. SP kyphoplasty. Pain well-controlled.  Able to ambulate and able to sit up.  Intractable nausea vomiting with abdominal pain. All symptoms currently resolved. Able to tolerate oral diet. Monitor.  Urinary retention. History appears to have resolved. Likely associated with constipation and back pain. Monitor for now.  Author: Lynden Oxford, MD [facility] 11/06/2022 5:02 PM   If 7PM-7AM, please contact night-coverage at [email or web address]

## 2022-11-06 NOTE — Progress Notes (Signed)
Patient ID: Danielle Houston, female   DOB: 1940-08-05, 82 y.o.   MRN: [id number] Pt doing ok post L2 KP yesterday; afebrile; no new labs; per nurse she is mobilizing ok and shifting in bed without increased back pain; puncture site L2 region ok; continue PT

## 2022-11-06 NOTE — Plan of Care (Signed)
  Problem: Education: Goal: Knowledge of General Education information will improve Description: Including pain rating scale, medication(s)/side effects and non-pharmacologic comfort measures Outcome: Progressing   Problem: Clinical Measurements: Goal: Ability to maintain clinical measurements within normal limits will improve Outcome: Progressing   Problem: Activity: Goal: Risk for activity intolerance will decrease Outcome: Progressing   Problem: Elimination: Goal: Will not experience complications related to urinary retention Outcome: Progressing   Problem: Pain Managment: Goal: General experience of comfort will improve Outcome: Progressing   Problem: Safety: Goal: Ability to remain free from injury will improve Outcome: Progressing   

## 2022-11-06 NOTE — Plan of Care (Signed)
  Problem: Safety: Goal: Ability to remain free from injury will improve 11/06/2022 0046 by Virgilio Belling, RN Outcome: Progressing 11/06/2022 0045 by Virgilio Belling, RN Outcome: Progressing   Problem: Pain Managment: Goal: General experience of comfort will improve 11/06/2022 0046 by Virgilio Belling, RN Outcome: Progressing 11/06/2022 0045 by Virgilio Belling, RN Outcome: Progressing   Problem: Nutrition: Goal: Adequate nutrition will be maintained Outcome: Progressing   Problem: Clinical Measurements: Goal: Ability to maintain clinical measurements within normal limits will improve 11/06/2022 0046 by Virgilio Belling, RN Outcome: Progressing 11/06/2022 0045 by Virgilio Belling, RN Outcome: Progressing   Problem: Education: Goal: Knowledge of General Education information will improve Description: Including pain rating scale, medication(s)/side effects and non-pharmacologic comfort measures 11/06/2022 0046 by Virgilio Belling, RN Outcome: Progressing 11/06/2022 0045 by Virgilio Belling, RN Outcome: Progressing

## 2022-11-06 NOTE — Plan of Care (Signed)
  Problem: Education: Goal: Knowledge of General Education information will improve Description: Including pain rating scale, medication(s)/side effects and non-pharmacologic comfort measures Outcome: Progressing   Problem: Activity: Goal: Risk for activity intolerance will decrease Outcome: Progressing   Problem: Pain Managment: Goal: General experience of comfort will improve Outcome: Progressing   

## 2022-11-06 NOTE — Progress Notes (Signed)
Physical Therapy Treatment Patient Details Name: Danielle Houston MRN: [id number] DOB: 03-31-1940 Today's Date: 11/06/2022   History of Present Illness Patient is a 82 year old female who presented after a fall at home resulting in back pain and inability to ambulate. CT revealed sub acute compression fx of L2 with approximately 50% loss of height, 3mm retropulsion. PMH: CKD, hypothyroidism, normocytic anemia, HTN.  Kyphoplasty 11/05/22    PT Comments  POD # 1 Kyphoplasty Pt in bed sleeping but easily aroused.  General Comments: mild groggy/sleepy initially but increased alertness during session.  Non English speaking. No family present. Pt indicating/gesturing "hungry/food".  Following gesture/movements/pointing to get OOB and walk to bathroom. General bed mobility comments: required increased time and assist to complete scooting to EOB.  No facial grimacing.  No indication of pain.  Was moving slowly.  Initially sleepy/groogy.  I did just wake her up. General transfer comment: visual cueing for direction and task.  Assisted with a toilet transfer.  Pt was able to lower and rise at [employer].  Pt was able to perform self peri care standing at Contact Guard Assist.  And pt was able to static stand at sink to wash her hands at Supervision level. General Gait Details: used a walker for safety/increased stability but appears pt does NOT use usually as she required Min Assist for direction/proper use/advancement.  Tolerated distance well with increased time.  NO indication of any pain.  Assisted with amb to bathroom then in hallway a total of 85 feet. Positioned in recliner to comfort and offered her an ensure which she drank.   Much improved mobility after surgery.  Sleepy/groggy suspect hospital setting/meds.  LPT has rec SNF on eval.  Pt will need 24/7 assist home with family, if unable then SNF.    If plan is discharge home, recommend the following: A little help with bathing/dressing/bathroom;A  little help with walking and/or transfers   Can travel by private vehicle     Yes  Equipment Recommendations  Rolling walker (2 wheels)    Recommendations for Other Services       Precautions / Restrictions Precautions Precautions: Fall;Back Precaution Comments: MD notes says "corset for comfort". Restrictions Weight Bearing Restrictions: No     Mobility  Bed Mobility Overal bed mobility: Needs Assistance Bed Mobility: Supine to Sit     Supine to sit: Min assist, Mod assist     General bed mobility comments: required increased time and assist to complete scooting to EOB.  No facial grimacing.  No indication of pain.  Was moving slowly.  Initially sleepy/groogy.  I did just wake her up.    Transfers Overall transfer level: Needs assistance Equipment used: None, Rolling walker (2 wheels) Transfers: Sit to/from Stand Sit to Stand: Min assist, Contact guard assist, Supervision           General transfer comment: visual cueing for direction and task.  Assisted with a toilet transfer.  Pt was able to lower and rise at [employer].  Pt was able to perform self peri care standing at Contact Guard Assist.  And pt was able to static stand at sink to wash her hands at Supervision level.    Ambulation/Gait Ambulation/Gait assistance: Supervision, Contact guard assist Gait Distance (Feet): 85 Feet Assistive device: Rolling walker (2 wheels), None Gait Pattern/deviations: Step-through pattern, Decreased stride length Gait velocity: decreased     General Gait Details: used a walker for safety/increased stability but appears pt does NOT use  usually as she required Min Assist for direction/proper use/advancement.  Tolerated distance well with increased time.  NO indication of any pain.  Assisted with amb to bathroom then in hallway a total of 85 feet.   Stairs             Wheelchair Mobility     Tilt Bed    Modified Rankin (Stroke Patients Only)        Balance                                            Cognition Arousal: Alert Behavior During Therapy: Flat affect Overall Cognitive Status: Difficult to assess                                 General Comments: mild groggy/sleepy initially but increased alertness during session.  Pt indicating/gesturing "hungry/food".  Following gesture/movements/pointing to get OOB and walk to bathroom.        Exercises      General Comments        Pertinent Vitals/Pain Pain Assessment Pain Assessment: Faces Faces Pain Scale: No hurt Pain Location: pt had no indication of pain during session Pain Intervention(s): Monitored during session, Repositioned    Home Living                          Prior Function            PT Goals (current goals can now be found in the care plan section) Progress towards PT goals: Progressing toward goals    Frequency    Min 1X/week      PT Plan      Co-evaluation              AM-PAC PT "6 Clicks" Mobility   Outcome Measure  Help needed turning from your back to your side while in a flat bed without using bedrails?: A Little Help needed moving from lying on your back to sitting on the side of a flat bed without using bedrails?: A Little Help needed moving to and from a bed to a chair (including a wheelchair)?: A Little Help needed standing up from a chair using your arms (e.g., wheelchair or bedside chair)?: A Little Help needed to walk in hospital room?: A Little Help needed climbing 3-5 steps with a railing? : A Little 6 Click Score: 18    End of Session Equipment Utilized During Treatment: Gait belt Activity Tolerance: Patient tolerated treatment well Patient left: in chair;with call bell/phone within reach;with chair alarm set Nurse Communication: Mobility status PT Visit Diagnosis: Unsteadiness on feet (R26.81);Dizziness and giddiness (R42)     Time: 7829-5621 PT Time Calculation  (min) (ACUTE ONLY): 23 min  Charges:    $Gait Training: 8-22 mins $Therapeutic Activity: 8-22 mins PT General Charges $$ ACUTE PT VISIT: 1 Visit                     Felecia Shelling  PTA Acute  Rehabilitation Services Office M-F          [phone]

## 2022-11-06 NOTE — TOC Progression Note (Signed)
Transition of Care [facility]) - Progression Note   Patient Details  Name: Danielle Houston MRN: [id number] Date of Birth: 02-09-1941  [facility]) CM/SW Contact  Ewing Schlein, LCSW Phone Number: 11/06/2022, 3:26 PM  Clinical Narrative: CSW spoke with granddaughter regarding bed offers. Granddaughter initially chose [facility] as first choice and [address] as second choice. CSW followed up with Tresa Endo in admissions at [facility] and [place] with central intake for [facility] and neither have the bed offer available anymore. Granddaughter chose [place]. CSW confirmed bed with [facility] in admissions. Facility to start insurance authorization.  Expected Discharge Plan: Skilled Nursing Facility Barriers to Discharge: Continued Medical Work up, SNF Pending bed offer, English as a second language teacher  Expected Discharge Plan and Services In-house Referral: Clinical Social Work Living arrangements for the past 2 months: Single Family Home  Social Determinants of Health (SDOH) Interventions SDOH Screenings   Food Insecurity: No Food Insecurity (11/03/2022)  Housing: Low Risk  (11/03/2022)  Transportation Needs: No Transportation Needs (11/03/2022)  Utilities: Not At Risk (11/03/2022)  Depression (PHQ2-9): High Risk (12/30/2021)  Tobacco Use: Low Risk  (11/03/2022)   Readmission Risk Interventions     No data to display

## 2022-11-06 NOTE — Progress Notes (Addendum)
Occupational Therapy Treatment Patient Details Name: Danielle Houston MRN: [id number] DOB: November 16, 1940 Today's Date: 11/06/2022   History of present illness Patient is a 82 year old female who presented after a fall at home resulting in back pain and inability to ambulate. CT revealed sub acute compression fx of L2 with approximately 50% loss of height, 3mm retropulsion. Now s/p kyphoplasty. PMH: CKD, hypothyroidism, normocytic anemia, HTN.  Kyphoplasty 11/05/22   OT comments  Pt is s/p kyphoplasty on 11-05-22. Pt was motivated to participate in the session. She was seen for progression of out of bed activity and general functional strengthening needed to facilitate improved ADL performance. She required min assist for bed mobility, for sit to stand transfers using a RW, then CGA for short distance ambulation in the room using a RW. She was assisted into upright sitting in the chair at the end of the session. Her movements were occasionally slower, however she exerted good effort. She repeatedly expressed a desire to return home at discharge instead of going to a SNF for rehab. If she could have in-home care with supervision, then she could return home with home therapy; if this is not available, short term SNF rehab may be warranted.       If plan is discharge home, recommend the following:  Direct supervision/assist for medications management;Assistance with cooking/housework;Assist for transportation;Help with stairs or ramp for entrance;A little help with walking and/or transfers;A lot of help with bathing/dressing/bathroom   Equipment Recommendations  None recommended by OT    Recommendations for Other Services      Precautions / Restrictions Precautions Precautions: Fall Precaution Comments: MD notes says "corset for comfort". Restrictions Weight Bearing Restrictions: No  Pt speaks [place], specific dialect is F[place]ounese. Audio interpreter used, name=Judy, ID [id number]      Mobility Bed  Mobility Overal bed mobility: Needs Assistance       Supine to sit: Min assist          Transfers Overall transfer level: Needs assistance Equipment used: Rolling walker (2 wheels) Transfers: Sit to/from Stand Sit to Stand: Min assist                     ADL either performed or assessed with clinical judgement   ADL Overall ADL's : Needs assistance/impaired Eating/Feeding: Independent;Sitting Eating/Feeding Details (indicate cue type and reason): based on clinical judgement Grooming: Set up;Sitting Grooming Details (indicate cue type and reason): simulated at chair level         Upper Body Dressing : Minimal assistance Upper Body Dressing Details (indicate cue type and reason): simulated in sitting Lower Body Dressing: Moderate assistance;Sit to/from stand Lower Body Dressing Details (indicate cue type and reason): based on clinical judgement Toilet Transfer: Minimal assistance;Rolling walker (2 wheels);Grab bars Toilet Transfer Details (indicate cue type and reason): at bathroom level                  Cognition Arousal: Alert   Overall Cognitive Status: Difficult to assess                            Pertinent Vitals/ Pain       Pain Assessment Pain Assessment: Faces Pain Score: 2  Pain Location: back Pain Intervention(s): Limited activity within patient's tolerance, Monitored during session         Frequency  Min 1X/week        Progress Toward Goals  OT Goals(current goals can now  be found in the care plan section)  Progress towards OT goals: Progressing toward goals  Acute Rehab OT Goals Patient Stated Goal: to return home at discharge OT Goal Formulation: With patient Time For Goal Achievement: 11/18/22 Potential to Achieve Goals: Good  Plan         AM-PAC OT "6 Clicks" Daily Activity     Outcome Measure   Help from another person eating meals?: None Help from another person taking care of personal grooming?: A  Little Help from another person toileting, which includes using toliet, bedpan, or urinal?: A Little Help from another person bathing (including washing, rinsing, drying)?: A Lot Help from another person to put on and taking off regular upper body clothing?: A Little Help from another person to put on and taking off regular lower body clothing?: A Lot 6 Click Score: 17    End of Session Equipment Utilized During Treatment: Gait belt;Rolling walker (2 wheels)  OT Visit Diagnosis: Unsteadiness on feet (R26.81);Muscle weakness (generalized) (M62.81)   Activity Tolerance Patient tolerated treatment well   Patient Left in chair;with call bell/phone within reach;with chair alarm set   Nurse Communication Other (comment) (pt requesting warm instead of cold water to drink and she also prefers warm and soft food to eat)        Time: 8756-4332 OT Time Calculation (min): 33 min  Charges: OT General Charges $OT Visit: 1 Visit OT Treatments $Therapeutic Activity: 23-37 mins     Reuben Likes, OTR/L 11/06/2022, 5:28 PM

## 2022-11-07 DIAGNOSIS — S32020A Wedge compression fracture of second lumbar vertebra, initial encounter for closed fracture: Secondary | ICD-10-CM | POA: Diagnosis not present

## 2022-11-07 LAB — BASIC METABOLIC PANEL
Anion gap: 6 (ref 5–15)
BUN: 37 mg/dL — ABNORMAL HIGH (ref 8–23)
CO2: 25 mmol/L (ref 22–32)
Calcium: 8.2 mg/dL — ABNORMAL LOW (ref 8.9–10.3)
Chloride: 107 mmol/L (ref 98–111)
Creatinine, Ser: 1.97 mg/dL — ABNORMAL HIGH (ref 0.44–1.00)
GFR, Estimated: 25 mL/min — ABNORMAL LOW (ref >60)
Glucose, Bld: 119 mg/dL — ABNORMAL HIGH (ref 70–99)
Potassium: 4.2 mmol/L (ref 3.5–5.1)
Sodium: 138 mmol/L (ref 135–145)

## 2022-11-07 NOTE — Progress Notes (Signed)
Physical Therapy Treatment Patient Details Name: Danielle Houston MRN: [id number] DOB: 1940-07-19 Today's Date: 11/07/2022   History of Present Illness Patient is a 82 year old female who presented after a fall at home resulting in back pain and inability to ambulate. CT revealed sub acute compression fx of L2 with approximately 50% loss of height, 3mm retropulsion. Now s/p kyphoplasty. PMH: CKD, hypothyroidism, normocytic anemia, HTN.  Kyphoplasty 11/05/22    PT Comments  Young family member in room with toddler.  General Comments: appears more alert today.  Family in room.  Pt "wants to walk" stated family. Assisted OOB required increased time.  General bed mobility comments: required increased time and assist to complete scooting to EOB.  No facial grimacing.  No indication of pain. General transfer comment: visual cueing for proper hand placement as safety with turn completion prior to sit. General Gait Details: first amb with walker in which pt required Max visual cueing on proper use as she was pushing walker out too far front.  Then amb with her pushing a baby stroller but strooler was too heavy so Therapist assisted with advancing and turning.  Trial amb without any AD less thatn 4 feet was difficult and almost resulted in a fall.  VERY unsteady without any AD. Prior was NOT using any AD. Pt lives home alone.  Pt will need ST Rehab at SNF to address mobility and functional decline prior to safely returning home.    If plan is discharge home, recommend the following: A little help with bathing/dressing/bathroom;A little help with walking and/or transfers   Can travel by private vehicle     Yes  Equipment Recommendations  Rolling walker (2 wheels)    Recommendations for Other Services       Precautions / Restrictions Precautions Precautions: Fall Precaution Comments: MD notes says "corset for comfort". Restrictions Weight Bearing Restrictions: No     Mobility  Bed Mobility Overal bed  mobility: Needs Assistance Bed Mobility: Supine to Sit     Supine to sit: Min assist     General bed mobility comments: required increased time and assist to complete scooting to EOB.  No facial grimacing.  No indication of pain.    Transfers Overall transfer level: Needs assistance Equipment used: Rolling walker (2 wheels) Transfers: Sit to/from Stand Sit to Stand: Min assist           General transfer comment: visual cueing for proper hand placement as safety with turn completion prior to sit.    Ambulation/Gait Ambulation/Gait assistance: Contact guard assist, Min assist, Mod assist Gait Distance (Feet): 225 Feet Assistive device: Rolling walker (2 wheels), None (baby stroller) Gait Pattern/deviations: Step-through pattern, Decreased stride length Gait velocity: decreased     General Gait Details: first amb with walker in which pt required Max visual cueing on proper use as she was pushing walker out too far front.  Then amb with her pushing a baby stroller but strooler was too heavy so Therapist assisted with advancing and turning.  Trial amb without any AD less thatn 4 feet was difficult and almost resulted in a fall.  VERY unsteady without any AD.   Stairs             Wheelchair Mobility     Tilt Bed    Modified Rankin (Stroke Patients Only)       Balance  Cognition Arousal: Alert Behavior During Therapy: Flat affect Overall Cognitive Status: Difficult to assess                                 General Comments: appears more alert today.  Family in room.  Pt "wants to walk" stated family.        Exercises      General Comments        Pertinent Vitals/Pain Pain Assessment Pain Assessment: Faces Faces Pain Scale: Hurts a little bit Pain Location: back Pain Descriptors / Indicators: Grimacing Pain Intervention(s): Monitored during session    Home Living                           Prior Function            PT Goals (current goals can now be found in the care plan section) Progress towards PT goals: Progressing toward goals    Frequency    Min 1X/week      PT Plan      Co-evaluation              AM-PAC PT "6 Clicks" Mobility   Outcome Measure  Help needed turning from your back to your side while in a flat bed without using bedrails?: A Little Help needed moving from lying on your back to sitting on the side of a flat bed without using bedrails?: A Little Help needed moving to and from a bed to a chair (including a wheelchair)?: A Little Help needed standing up from a chair using your arms (e.g., wheelchair or bedside chair)?: A Little Help needed to walk in hospital room?: A Lot Help needed climbing 3-5 steps with a railing? : Total 6 Click Score: 15    End of Session Equipment Utilized During Treatment: Gait belt Activity Tolerance: Patient limited by fatigue Patient left: in chair;with call bell/phone within reach;with chair alarm set Nurse Communication: Mobility status PT Visit Diagnosis: Unsteadiness on feet (R26.81);Dizziness and giddiness (R42)     Time: 0981-1914 PT Time Calculation (min) (ACUTE ONLY): 24 min  Charges:    $Gait Training: 8-22 mins $Therapeutic Activity: 8-22 mins PT General Charges $$ ACUTE PT VISIT: 1 Visit                     Felecia Shelling  PTA Acute  Rehabilitation Services Office M-F          [phone]

## 2022-11-07 NOTE — TOC Progression Note (Signed)
Transition of Care [facility]) - Progression Note   Patient Details  Name: Danielle Houston MRN: [id number] Date of Birth: [birth year]  Transition of [facility]) CM/SW Contact  Ewing Schlein, LCSW Phone Number: 11/07/2022, 1:42 PM  Clinical Narrative: CSW notified by Crystal with [facility] that the insurance authorization is still pending and may take up to 48 hours.  Expected Discharge Plan: Skilled Nursing Facility Barriers to Discharge: Continued Medical Work up, SNF Pending bed offer, English as a second language teacher  Expected Discharge Plan and Services In-house Referral: Clinical Social Work Living arrangements for the past 2 months: Single Family Home  Social Determinants of Health (SDOH) Interventions SDOH Screenings   Food Insecurity: No Food Insecurity (11/03/2022)  Housing: Low Risk  (11/03/2022)  Transportation Needs: No Transportation Needs (11/03/2022)  Utilities: Not At Risk (11/03/2022)  Depression (PHQ2-9): High Risk (12/30/2021)  Tobacco Use: Low Risk  (11/03/2022)   Readmission Risk Interventions     No data to display

## 2022-11-07 NOTE — Plan of Care (Signed)
  Problem: Education: Goal: Knowledge of General Education information will improve Description: Including pain rating scale, medication(s)/side effects and non-pharmacologic comfort measures Outcome: Adequate for Discharge   Problem: Health Behavior/Discharge Planning: Goal: Ability to manage health-related needs will improve Outcome: Adequate for Discharge   Problem: Clinical Measurements: Goal: Ability to maintain clinical measurements within normal limits will improve Outcome: Progressing Goal: Will remain free from infection Outcome: Progressing Goal: Diagnostic test results will improve Outcome: Progressing Goal: Respiratory complications will improve Outcome: Adequate for Discharge Goal: Cardiovascular complication will be avoided Outcome: Adequate for Discharge   Problem: Activity: Goal: Risk for activity intolerance will decrease Outcome: Progressing   Problem: Nutrition: Goal: Adequate nutrition will be maintained Outcome: Completed/Met   Problem: Coping: Goal: Level of anxiety will decrease Outcome: Completed/Met   Problem: Elimination: Goal: Will not experience complications related to bowel motility Outcome: Completed/Met Goal: Will not experience complications related to urinary retention Outcome: Completed/Met

## 2022-11-07 NOTE — Progress Notes (Signed)
[facility] PROGRESS NOTE  Patient: Danielle Houston JXB:[id number]   PCP: Hoy Register, MD DOB: 25-Jun-1940   DOA: 11/02/2022   DOS: 11/07/2022    Subjective: No acute events overnight.  No nausea no vomiting.  Pain well-controlled.  Objective:  Vitals:   11/06/22 0530 11/06/22 2136 11/07/22 0540 11/07/22 1404  BP: (!) 173/71 137/75 (!) 141/64 (!) 125/49  Pulse: 68 68 69 76  Resp: 15 16 15 17   Temp: 98.2 F (36.8 C) 99.5 F (37.5 C) 99.6 F (37.6 C) 98 F (36.7 C)  TempSrc: Oral Oral Oral Oral  SpO2: 98% 99% 98% 97%  Weight:      Height:       S1-S2 present. Bowel sound present. Clear to auscultation. Assessment and plan: Compression fracture of lumbar vertebra. SP kyphoplasty. Pain well-controlled.  Monitor. Medically stable.  Awaiting SNF placement.  Author: Lynden Oxford, MD [facility] 11/07/2022 5:40 PM   If 7PM-7AM, please contact night-coverage at [email or web address]

## 2022-11-08 ENCOUNTER — Inpatient Hospital Stay (HOSPITAL_COMMUNITY): Payer: Medicaid Other

## 2022-11-08 DIAGNOSIS — S32020A Wedge compression fracture of second lumbar vertebra, initial encounter for closed fracture: Secondary | ICD-10-CM | POA: Diagnosis not present

## 2022-11-08 LAB — TROPONIN I (HIGH SENSITIVITY): Troponin I (High Sensitivity): 7 ng/L (ref <18)

## 2022-11-08 MED ORDER — ALUM & MAG HYDROXIDE-SIMETH 200-200-20 MG/5ML PO SUSP: 30.0000 mL | ORAL | Status: DC | PRN

## 2022-11-08 MED ORDER — SENNOSIDES-DOCUSATE SODIUM 8.6-50 MG PO TABS
1.0000 | ORAL_TABLET | Freq: Every day | ORAL | Status: DC
  Administered 2022-11-09 – 2022-11-10 (×2): 1 via ORAL
  Filled 2022-11-08 (×2): qty 1

## 2022-11-08 MED ORDER — ASPIRIN 81 MG PO TBEC
81.0000 mg | DELAYED_RELEASE_TABLET | Freq: Every day | ORAL | Status: DC
  Administered 2022-11-08 – 2022-11-11 (×4): 81 mg via ORAL
  Filled 2022-11-08 (×4): qty 1

## 2022-11-08 NOTE — Progress Notes (Signed)
[facility] Progress Note Patient: Danielle Houston ZOX:[id number] DOB: [birth year] DOA: 11/02/2022  DOS: the patient was seen and examined on 11/08/2022  Brief hospital course: PMH of  HTN, hypothyroidism, CAD s/p CABG. presents to the hospital with complaints of fall found to have lumbar compression fracture with retropulsion.  IR was consulted underwent kyphoplasty.  Now awaiting SNF placement. Intermittently complaining of abdominal pain and urinary retention. Assessment and Plan: Fall, intractable pain, compression fracture of lumbar vertebra CT lumbar spine showed acute to subacute compression fracture L2 with approximately 50% loss of height, 3 mm retropulsion EDP discussed with neurosurgery NP, recommendations nonsurgical, TLSO, PT and pain control Patient had intractable back pain, MRI L-spine showed acute compression fracture of L2 vertebral body with 60% vertebral body height loss, minimal retropulsion at the superior endplate resulting in mild canal stenosis  Continue pain control, added oxycodone 5 mg every 4 hours as needed, Neurontin 100 mg twice daily, titrate up as needed.  Continue IV fentanyl for severe pain. Underwent kyphoplasty on 8/21.   Nausea and vomiting Abdominal pain. Patient was noted to have nausea and vomiting after morphine in ED.  Did not have any nausea or vomiting before admission KUB with nonobstructive pattern Continue antiemetics as needed, soft diet  Urinary retention. Required In-N-Out catheterization x 2. No further retention.  Chest pain. Reported chest pain on 8/24. EKG unremarkable. Troponin ordered. Check chest x-ray. Examination unremarkable. Monitor.  Pain resolved.   Vitamin D deficiency Vitamin D level 26.8, will place on vitamin D replacement   Normocytic anemia Baseline hemoglobin 7-8, likely anemia of chronic disease due to CKD stage IV H&H currently close to baseline   Hypothyroidism Reported history. Does not appear to be on  Synthroid.  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  Monitor improving with hydration.   Subjective: Reported some chest pain.  No nausea no vomiting no fever no chills.  Physical Exam: General: in Mild distress, No Rash Cardiovascular: S1 and S2 Present, No Murmur Respiratory: Good respiratory effort, Bilateral Air entry present. No Crackles, occasional wheezes Abdomen: Bowel Sound present, No tenderness Extremities: No edema Neuro: Alert and unable to perform orientation question.  Patient able to follow all commands.  no new focal deficit  Data Reviewed: I have Reviewed nursing notes, Vitals, and Lab results. Since last encounter, pertinent lab results CBC and BMP   . I have ordered test including CBC and BMP  .   Disposition: Status is: Inpatient Remains inpatient appropriate because: Awaiting placement.  enoxaparin (LOVENOX) injection 30 mg Start: 11/06/22 1400   Family Communication: No one at bedside Level of care: Med-Surg   Vitals:   11/07/22 2035 11/08/22 0646 11/08/22 1446 11/08/22 1717  BP: 127/62 (!) 152/80 116/65 (!) 164/71  Pulse: 77 70 74 68  Resp: 19 16 15 20   Temp: 98.8 F (37.1 C) 98.9 F (37.2 C) 99.9 F (37.7 C) 98.8 F (37.1 C)  TempSrc: Oral Oral Oral Oral  SpO2: 95% 97% 98% 99%  Weight:      Height:         Author: Lynden Oxford, MD 11/08/2022 5:43 PM  Please look on [email or web address] to find out who is on call.

## 2022-11-08 NOTE — Progress Notes (Signed)
Mobility Specialist - Progress Note   11/08/22 0940  Mobility  Activity Transferred to/from [facility]  Level of Assistance Contact guard assist, steadying assist  Assistive Device Front wheel walker  Distance Ambulated (ft) 2 ft  Activity Response Tolerated well  Mobility Referral Yes  $Mobility charge 1 Mobility  Mobility Specialist Start Time (ACUTE ONLY) [id number]  Mobility Specialist Stop Time (ACUTE ONLY) [id number]  Mobility Specialist Time Calculation (min) (ACUTE ONLY) 15 min   Pt received in bed requesting assistance to [facility] for BM. No complaints during session. Pt to bed after session with all needs met & call bell in reach.    [facility]

## 2022-11-08 NOTE — Progress Notes (Signed)
Patient called out ,NT in room, patient pointing to chest, NT called RN to room. Granddaughter called to translate, she stated she said she is having chest pain. Dr Allena Katz notified, VS taken and EKG done. Orders received. Patient calm and quiet.  Patients son notified with no questions ask  D  Actuary

## 2022-11-09 DIAGNOSIS — S32020A Wedge compression fracture of second lumbar vertebra, initial encounter for closed fracture: Secondary | ICD-10-CM | POA: Diagnosis not present

## 2022-11-09 NOTE — Progress Notes (Signed)
[facility] PROGRESS NOTE  Patient: Danielle Houston ZOX:[id number]   PCP: Hoy Register, MD DOB: 10-Oct-1940   DOA: 11/02/2022   DOS: 11/09/2022    Subjective: No acute events overnight.  Objective:  Vitals:   11/08/22 1717 11/08/22 2148 11/09/22 0425 11/09/22 1106  BP: (!) 164/71 (!) 158/87 (!) 198/90 (!) 153/64  Pulse: 68 74 70 74  Resp: 20 17 18 20   Temp: 98.8 F (37.1 C) 99.3 F (37.4 C) 97.8 F (36.6 C) 98.6 F (37 C)  TempSrc: Oral Oral Oral Oral  SpO2: 99% 98% 99% 98%  Weight:      Height:       Trace edema. S1-S2 present.  Assessment and plan: Lumbar compression fracture. Remains medically stable for transfer to SNF.  Author: Lynden Oxford, MD [facility] 11/09/2022 6:12 PM   If 7PM-7AM, please contact night-coverage at [email or web address]

## 2022-11-09 NOTE — Plan of Care (Signed)
  Problem: Education: Goal: Knowledge of General Education information will improve Description: Including pain rating scale, medication(s)/side effects and non-pharmacologic comfort measures Outcome: Adequate for Discharge   Problem: Health Behavior/Discharge Planning: Goal: Ability to manage health-related needs will improve Outcome: Progressing   Problem: Clinical Measurements: Goal: Ability to maintain clinical measurements within normal limits will improve Outcome: Adequate for Discharge Goal: Will remain free from infection Outcome: Progressing Goal: Diagnostic test results will improve Outcome: Progressing Goal: Respiratory complications will improve Outcome: Adequate for Discharge Goal: Cardiovascular complication will be avoided Outcome: Progressing   Problem: Activity: Goal: Risk for activity intolerance will decrease Outcome: Adequate for Discharge   Problem: Pain Managment: Goal: General experience of comfort will improve Outcome: Progressing   Problem: Safety: Goal: Ability to remain free from injury will improve Outcome: Progressing   Problem: Skin Integrity: Goal: Risk for impaired skin integrity will decrease Outcome: Adequate for Discharge

## 2022-11-10 DIAGNOSIS — S32020A Wedge compression fracture of second lumbar vertebra, initial encounter for closed fracture: Secondary | ICD-10-CM | POA: Diagnosis not present

## 2022-11-10 LAB — BASIC METABOLIC PANEL
Anion gap: 6 (ref 5–15)
Anion gap: 8 (ref 5–15)
BUN: 33 mg/dL — ABNORMAL HIGH (ref 8–23)
BUN: 30 mg/dL — ABNORMAL HIGH (ref 8–23)
CO2: 27 mmol/L (ref 22–32)
CO2: 25 mmol/L (ref 22–32)
Calcium: 8.1 mg/dL — ABNORMAL LOW (ref 8.9–10.3)
Calcium: 8.3 mg/dL — ABNORMAL LOW (ref 8.9–10.3)
Chloride: 106 mmol/L (ref 98–111)
Chloride: 104 mmol/L (ref 98–111)
Creatinine, Ser: 2.22 mg/dL — ABNORMAL HIGH (ref 0.44–1.00)
Creatinine, Ser: 1.99 mg/dL — ABNORMAL HIGH (ref 0.44–1.00)
GFR, Estimated: 22 mL/min — ABNORMAL LOW (ref >60)
GFR, Estimated: 25 mL/min — ABNORMAL LOW (ref >60)
Glucose, Bld: 108 mg/dL — ABNORMAL HIGH (ref 70–99)
Glucose, Bld: 123 mg/dL — ABNORMAL HIGH (ref 70–99)
Potassium: 4.3 mmol/L (ref 3.5–5.1)
Potassium: 4.1 mmol/L (ref 3.5–5.1)
Sodium: 139 mmol/L (ref 135–145)
Sodium: 137 mmol/L (ref 135–145)

## 2022-11-10 LAB — CBC WITH DIFFERENTIAL/PLATELET
Abs Immature Granulocytes: 0.04 10*3/uL (ref 0.00–0.07)
Basophils Absolute: 0 10*3/uL (ref 0.0–0.1)
Basophils Relative: 1 %
Eosinophils Absolute: 0.2 10*3/uL (ref 0.0–0.5)
Eosinophils Relative: 3 %
HCT: 25 % — ABNORMAL LOW (ref 36.0–46.0)
Hemoglobin: 8 g/dL — ABNORMAL LOW (ref 12.0–15.0)
Immature Granulocytes: 1 %
Lymphocytes Relative: 17 %
Lymphs Abs: 1.1 10*3/uL (ref 0.7–4.0)
MCH: 31.1 pg (ref 26.0–34.0)
MCHC: 32 g/dL (ref 30.0–36.0)
MCV: 97.3 fL (ref 80.0–100.0)
Monocytes Absolute: 0.4 10*3/uL (ref 0.1–1.0)
Monocytes Relative: 7 %
Neutro Abs: 4.8 10*3/uL (ref 1.7–7.7)
Neutrophils Relative %: 71 %
Platelets: 212 10*3/uL (ref 150–400)
RBC: 2.57 MIL/uL — ABNORMAL LOW (ref 3.87–5.11)
RDW: 12.1 % (ref 11.5–15.5)
WBC: 6.5 10*3/uL (ref 4.0–10.5)
nRBC: 0 % (ref 0.0–0.2)

## 2022-11-10 LAB — CBC
HCT: 22.7 % — ABNORMAL LOW (ref 36.0–46.0)
Hemoglobin: 7.2 g/dL — ABNORMAL LOW (ref 12.0–15.0)
MCH: 31 pg (ref 26.0–34.0)
MCHC: 31.7 g/dL (ref 30.0–36.0)
MCV: 97.8 fL (ref 80.0–100.0)
Platelets: 187 10*3/uL (ref 150–400)
RBC: 2.32 MIL/uL — ABNORMAL LOW (ref 3.87–5.11)
RDW: 12.2 % (ref 11.5–15.5)
WBC: 6 10*3/uL (ref 4.0–10.5)
nRBC: 0 % (ref 0.0–0.2)

## 2022-11-10 LAB — RETICULOCYTES
Immature Retic Fract: 10.7 % (ref 2.3–15.9)
RBC.: 2.55 MIL/uL — ABNORMAL LOW (ref 3.87–5.11)
Retic Count, Absolute: 37.2 10*3/uL (ref 19.0–186.0)
Retic Ct Pct: 1.5 % (ref 0.4–3.1)

## 2022-11-10 LAB — TYPE AND SCREEN
ABO/RH(D): O POS
Antibody Screen: NEGATIVE

## 2022-11-10 LAB — MAGNESIUM: Magnesium: 1.8 mg/dL (ref 1.7–2.4)

## 2022-11-10 LAB — ABO/RH: ABO/RH(D): O POS

## 2022-11-10 MED ORDER — MELATONIN 3 MG PO TABS
3.0000 mg | ORAL_TABLET | Freq: Every day | ORAL | Status: DC
  Administered 2022-11-10: 3 mg via ORAL
  Filled 2022-11-10: qty 1

## 2022-11-10 MED ORDER — PANTOPRAZOLE SODIUM 40 MG PO TBEC
40.0000 mg | DELAYED_RELEASE_TABLET | Freq: Two times a day (BID) | ORAL | Status: DC
  Administered 2022-11-10 – 2022-11-11 (×2): 40 mg via ORAL
  Filled 2022-11-10 (×2): qty 1

## 2022-11-10 NOTE — Plan of Care (Signed)
  Problem: Education: Goal: Knowledge of General Education information will improve Description: Including pain rating scale, medication(s)/side effects and non-pharmacologic comfort measures Outcome: Progressing   Problem: Health Behavior/Discharge Planning: Goal: Ability to manage health-related needs will improve Outcome: Progressing   Problem: Clinical Measurements: Goal: Ability to maintain clinical measurements within normal limits will improve Outcome: Progressing Goal: Will remain free from infection Outcome: Progressing Goal: Diagnostic test results will improve Outcome: Progressing Goal: Respiratory complications will improve Outcome: Adequate for Discharge Goal: Cardiovascular complication will be avoided Outcome: Adequate for Discharge   Problem: Activity: Goal: Risk for activity intolerance will decrease Outcome: Adequate for Discharge   Problem: Pain Managment: Goal: General experience of comfort will improve Outcome: Progressing   Problem: Safety: Goal: Ability to remain free from injury will improve Outcome: Progressing   Problem: Skin Integrity: Goal: Risk for impaired skin integrity will decrease Outcome: Adequate for Discharge

## 2022-11-10 NOTE — Progress Notes (Signed)
Physical Therapy Treatment Patient Details Name: Danielle Houston MRN: [id number] DOB: 1940/10/28 Today's Date: 11/10/2022   History of Present Illness Patient is a 82 year old female who presented after a fall at home resulting in back pain and inability to ambulate. CT revealed sub acute compression fx of L2 with approximately 50% loss of height, 3mm retropulsion. Now s/p kyphoplasty. PMH: CKD, hypothyroidism, normocytic anemia, HTN.  Kyphoplasty 11/05/22    PT Comments  Pt in bed sleeping but easily aroused.  Pleasant/cooperative.  Non English speaking.  Assisted OOB to amb to bathroom.  General transfer comment: visual cueing for proper hand placement as safety with turn completion prior to sit. Also assisted with a toilet transfer.  Pt self able to perform peri care. Assisted with amb in hallway.  General Gait Details: first amb with walker at Contact Guard Assist visual cueing for direction and proper walker to self distance.  Then amb without walker (prior level) pt present with increased instability, decreased step length and decreased gait speed. Mod Fall Risk.   Pt lives home alone. Pt will need ST Rehab at SNF to address mobility and functional decline prior to safely returning home.    If plan is discharge home, recommend the following: A little help with bathing/dressing/bathroom;A little help with walking and/or transfers   Can travel by private vehicle     Yes  Equipment Recommendations  Rolling walker (2 wheels)    Recommendations for Other Services       Precautions / Restrictions Precautions Precautions: Fall Precaution Comments: MD notes says "corset for comfort". Restrictions Weight Bearing Restrictions: No     Mobility  Bed Mobility Overal bed mobility: Needs Assistance Bed Mobility: Supine to Sit     Supine to sit: Supervision, Contact guard     General bed mobility comments: required increased time and assist to complete scooting to EOB.  No facial grimacing.   No indication of pain.    Transfers Overall transfer level: Needs assistance Equipment used: Rolling walker (2 wheels), None Transfers: Sit to/from Stand Sit to Stand: Supervision, Contact guard assist           General transfer comment: visual cueing for proper hand placement as safety with turn completion prior to sit. Also assisted with a toilet transfer.  Pt self able to perform peri care.    Ambulation/Gait Ambulation/Gait assistance: Supervision, Contact guard assist, Min assist Gait Distance (Feet): 185 Feet Assistive device: Rolling walker (2 wheels), None Gait Pattern/deviations: Step-through pattern, Decreased stride length Gait velocity: decreased     General Gait Details: first amb with walker at Contact Guard Assist visual cueing for direction and proper walker to self distance.  Then amb without walker (prior level) pt present with increased instability, decreased step length and decreased gait speed.   Stairs             Wheelchair Mobility     Tilt Bed    Modified Rankin (Stroke Patients Only)       Balance                                            Cognition Arousal: Alert Behavior During Therapy: WFL for tasks assessed/performed Overall Cognitive Status: Within Functional Limits for tasks assessed  General Comments: pleasant and coopertaive        Exercises      General Comments        Pertinent Vitals/Pain Pain Assessment Pain Assessment: No/denies pain Pain Location: back (no signifanct pain after Kyphoplasty)    Home Living                          Prior Function            PT Goals (current goals can now be found in the care plan section) Progress towards PT goals: Progressing toward goals    Frequency    Min 1X/week      PT Plan      Co-evaluation              AM-PAC PT "6 Clicks" Mobility   Outcome Measure  Help needed  turning from your back to your side while in a flat bed without using bedrails?: A Little Help needed moving from lying on your back to sitting on the side of a flat bed without using bedrails?: A Little Help needed moving to and from a bed to a chair (including a wheelchair)?: A Little Help needed standing up from a chair using your arms (e.g., wheelchair or bedside chair)?: A Little Help needed to walk in hospital room?: A Little Help needed climbing 3-5 steps with a railing? : A Lot 6 Click Score: 17    End of Session Equipment Utilized During Treatment: Gait belt Activity Tolerance: Patient limited by fatigue Patient left: in chair;with call bell/phone within reach;with chair alarm set Nurse Communication: Mobility status PT Visit Diagnosis: Unsteadiness on feet (R26.81);Dizziness and giddiness (R42)     Time: 5784-6962 PT Time Calculation (min) (ACUTE ONLY): 20 min  Charges:    $Gait Training: 8-22 mins PT General Charges $$ ACUTE PT VISIT: 1 Visit                     Felecia Shelling  PTA Acute  Rehabilitation Services Office M-F          [phone]

## 2022-11-10 NOTE — Plan of Care (Signed)
?  Problem: Pain Managment: ?Goal: General experience of comfort will improve ?Outcome: Progressing ?  ?Problem: Activity: ?Goal: Risk for activity intolerance will decrease ?Outcome: Progressing ?  ?Problem: Safety: ?Goal: Ability to remain free from injury will improve ?Outcome: Progressing ?  ?

## 2022-11-10 NOTE — TOC Progression Note (Signed)
Transition of Care [facility]) - Progression Note    Patient Details  Name: Danielle Houston MRN: [id number] Date of Birth: [birth year]  [facility] CM/SW Contact  Darleene Cleaver, [place] Phone Number: 11/10/2022, 4:18 PM  Clinical Narrative:     CSW contacted Grover Canavan at [facility] admissions.  Patient has been approved for SNF placement, and they can accept patient tomorrow after 10am.  CSW updated bedside nurse, charge nurse, and attending physician.  CSW to continue to follow patient's progress throughout discharge planning.  Expected Discharge Plan: Skilled Nursing Facility Barriers to Discharge: Continued Medical Work up, SNF Pending bed offer, English as a second language teacher  Expected Discharge Plan and Services In-house Referral: Clinical Social Work     Living arrangements for the past 2 months: Single Family Home                                       Social Determinants of Health (SDOH) Interventions SDOH Screenings   Food Insecurity: No Food Insecurity (11/03/2022)  Housing: Low Risk  (11/03/2022)  Transportation Needs: No Transportation Needs (11/03/2022)  Utilities: Not At Risk (11/03/2022)  Depression (PHQ2-9): High Risk (12/30/2021)  Tobacco Use: Low Risk  (11/03/2022)    Readmission Risk Interventions     No data to display

## 2022-11-10 NOTE — Progress Notes (Signed)
[facility] Progress Note Patient: Danielle Houston ZOX:[id number] DOB: 1940/12/12 DOA: 11/02/2022  DOS: the patient was seen and examined on 11/10/2022  Brief hospital course: PMH of  HTN, hypothyroidism, CAD s/p CABG. presents to the hospital with complaints of fall found to have lumbar compression fracture with retropulsion.  IR was consulted underwent kyphoplasty.  Now awaiting SNF placement. Intermittently complaining of abdominal pain and urinary retention. Has been medically stable since 8/22.  Currently awaiting placement. Likely discharge to SNF tomorrow.  Assessment and Plan: Fall, intractable pain, compression fracture of lumbar vertebra CT lumbar spine showed acute to subacute compression fracture L2 with approximately 50% loss of height, 3 mm retropulsion EDP discussed with neurosurgery NP, recommendations nonsurgical, TLSO, PT and pain control Patient had intractable back pain, MRI L-spine showed acute compression fracture of L2 vertebral body with 60% vertebral body height loss, minimal retropulsion at the superior endplate resulting in mild canal stenosis  Continue pain control, added oxycodone 5 mg every 4 hours as needed, Neurontin 100 mg twice daily, titrate up as needed.  Continue IV fentanyl for severe pain. Underwent kyphoplasty on 8/21.   Nausea and vomiting Abdominal pain. Patient was noted to have nausea and vomiting after morphine in ED.  Did not have any nausea or vomiting before admission KUB with nonobstructive pattern Continue antiemetics as needed, soft diet  Urinary retention. Required In-N-Out catheterization x 2. No further retention.  Chest pain. Reported chest pain on 8/24. EKG unremarkable. Troponin ordered. Check chest x-ray. Examination unremarkable. Monitor.  Pain resolved.   Vitamin D deficiency Vitamin D level 26.8, will place on vitamin D replacement   Normocytic anemia Baseline hemoglobin 7-8, likely anemia of chronic disease due to  CKD stage IV H&H currently close to baseline   Hypothyroidism Reported history. Does not appear to be on Synthroid.  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  Improved after hydration.  Normocytic anemia. Most likely anemia of chronic disease. Serum creatinine has been elevated chronically and hemoglobin has been stable around 8. Drop down to 7.2 for some reason on 8/26 most likely lab error. Repeat hemoglobin is stable. Monitor. Will check B12 folic acid tomorrow.   Subjective: Pain well-controlled.  Granddaughter helped interpret. No nausea no vomiting.  No constipation.  Physical Exam: General: in Mild distress, No Rash Cardiovascular: S1 and S2 Present, aortic systolic  Murmur Respiratory: Good respiratory effort, Bilateral Air entry present. No Crackles, No wheezes Abdomen: Bowel Sound present, No tenderness Extremities: No edema Neuro: Alert and oriented x3, no new focal deficit  Data Reviewed: I have Reviewed nursing notes, Vitals, and Lab results. Since last encounter, pertinent lab results CBC and BMP   . I have ordered test including CBC, B12, folic acid  .   Disposition: Status is: Inpatient Remains inpatient appropriate because: Medically stable.  Awaiting placement.  Unsafe to go home.  enoxaparin (LOVENOX) injection 30 mg Start: 11/06/22 1400   Family Communication: Granddaughter at bedside Level of care: Med-Surg   Vitals:   11/09/22 0425 11/09/22 1106 11/09/22 2300 11/10/22 1334  BP: (!) 198/90 (!) 153/64 (!) 174/71 119/61  Pulse: 70 74 65 80  Resp: 18 20 15 17   Temp: 97.8 F (36.6 C) 98.6 F (37 C) 98.3 F (36.8 C) 98.5 F (36.9 C)  TempSrc: Oral Oral Oral   SpO2: 99% 98% 98% 97%  Weight:  Height:         Author: Lynden Oxford, MD 11/10/2022 6:57 PM  Please look on [email or web address] to find out who is  on call.

## 2022-11-11 ENCOUNTER — Other Ambulatory Visit: Payer: Self-pay

## 2022-11-11 DIAGNOSIS — S32020A Wedge compression fracture of second lumbar vertebra, initial encounter for closed fracture: Secondary | ICD-10-CM | POA: Diagnosis not present

## 2022-11-11 MED ORDER — PANTOPRAZOLE SODIUM 40 MG PO TBEC
40.0000 mg | DELAYED_RELEASE_TABLET | Freq: Two times a day (BID) | ORAL | 0 refills | Status: AC
  Filled 2022-11-11: qty 60, 30d supply, fill #0

## 2022-11-11 MED ORDER — VITAMIN D (ERGOCALCIFEROL) 1.25 MG (50000 UNIT) PO CAPS
50000.0000 [IU] | ORAL_CAPSULE | ORAL | 0 refills | Status: AC
  Filled 2022-11-11: qty 5, 35d supply, fill #0

## 2022-11-11 NOTE — Progress Notes (Signed)
Called to give report and unable to reach or left msg.

## 2022-11-11 NOTE — Discharge Summary (Signed)
Physician Discharge Summary   Patient: Danielle Houston MRN: [id number] DOB: 1940/07/02  Admit date:     11/02/2022  Discharge date: 11/11/22  Discharge Physician: Lynden Oxford  PCP: Hoy Register, MD  Recommendations at discharge:  Follow up with PCP in 1 week   Contact information for follow-up providers     Hoy Register, MD. Schedule an appointment as soon as possible for a visit in 1 week(s).   Specialty: Family Medicine Why: with BMP lab to look at kidney and electrolytes Contact information: [address] [place] [place] [address] [phone]              Contact information for after-discharge care     Destination     [facility] .   Service: Skilled Nursing Contact information: [address] 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 presents to the hospital with complaints of fall found to have lumbar compression fracture with retropulsion.  IR was consulted underwent kyphoplasty.  Now awaiting SNF placement. Intermittently complaining of abdominal pain and urinary retention. Has been medically stable since 8/22.    Assessment and Plan: Fall, intractable pain, compression fracture of lumbar vertebra CT lumbar spine showed acute to subacute compression fracture L2 with approximately 50% loss of height, 3 mm retropulsion EDP discussed with neurosurgery NP, recommendations nonsurgical, TLSO, PT and pain control Patient had intractable back pain, MRI L-spine showed acute compression fracture of L2 vertebral body with 60% vertebral body height loss, minimal  retropulsion at the superior endplate resulting in mild canal stenosis  Continue pain control, added oxycodone 5 mg every 4 hours as needed, Neurontin 100 mg twice daily, titrate up as needed.  Continue IV fentanyl for severe pain. Underwent kyphoplasty on 8/21.   Nausea and vomiting Abdominal pain. Patient was noted to have nausea and vomiting after morphine in ED.  Did not have any nausea or vomiting before admission KUB with nonobstructive pattern Continue antiemetics as needed, soft diet  Urinary retention. Required In-N-Out catheterization x 2. No further retention.  Chest pain. Reported chest pain on 8/24. EKG unremarkable. Troponin negative.  CXR negative Examination unremarkable. Pain resolved.   Vitamin D deficiency Vitamin D level 26.8, will place on vitamin D replacement   Normocytic anemia Baseline hemoglobin 7-8, likely anemia of chronic disease due to CKD stage IV H&H currently close to baseline   Hypothyroidism Reported history. Does not appear to be on Synthroid.  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  Improved after hydration.  Normocytic anemia. Most likely anemia of chronic disease. Serum creatinine has been elevated chronically and hemoglobin has been stable around 8. Drop down to 7.2 for some reason on 8/26 most likely lab error. Repeat hemoglobin is stable.   Pain control - [employer] Controlled Substance Reporting System database was reviewed. and patient was instructed, not to drive, operate heavy machinery, perform activities at heights, swimming or participation in water activities or  provide baby-sitting services while on Pain, Sleep and Anxiety Medications; until their outpatient Physician has advised to do so again. Also recommended to not to take more than prescribed Pain, Sleep and Anxiety  Medications.  Consultants:  IR  Procedures performed:  Kyphoplasty  DISCHARGE MEDICATION: Allergies as of 11/11/2022   No Known Allergies      Medication List     STOP taking these medications    amLODipine 5 MG tablet Commonly known as: NORVASC   cyanocobalamin 1000 MCG tablet Commonly known as: VITAMIN B12   omeprazole 20 MG capsule Commonly known as: PRILOSEC   Premarin vaginal cream Generic drug: conjugated estrogens       TAKE these medications    alendronate 70 MG tablet Commonly known as: FOSAMAX Take 1 tablet (70 mg total) by mouth every 7 (seven) days. Take with a full glass of water on an empty stomach.   allopurinol 100 MG tablet Commonly known as: ZYLOPRIM Take 0.5 tablets (50 mg total) by mouth every other day.   aspirin EC 81 MG tablet Take 1 tablet (81 mg total) by mouth daily. Swallow whole.   atorvastatin 40 MG tablet Commonly known as: LIPITOR TAKE 1 TABLET (40 MG TOTAL) BY MOUTH DAILY.   carvedilol 12.5 MG tablet Commonly known as: COREG TAKE 1 TABLET (12.5 MG TOTAL) BY MOUTH 2 (TWO) TIMES DAILY WITH A MEAL.   diclofenac Sodium 1 % Gel Commonly known as: VOLTAREN APPLY 2 Grams TOPICALLY 4 (FOUR) TIMES DAILY as needed for PAIN   hydrALAZINE 100 MG tablet Commonly known as: APRESOLINE Take 1 tablet (100 mg total) by mouth 2 (two) times daily.   isosorbide mononitrate 120 MG 24 hr tablet Commonly known as: IMDUR Take 1 tablet (120 mg total) by mouth daily.   lidocaine 5 % Commonly known as: Lidoderm Place 1 patch onto the skin daily. Remove & Discard patch within 12 hours or as directed by MD   pantoprazole 40 MG tablet Commonly known as: PROTONIX Take 1 tablet (40 mg total) by mouth 2 (two) times daily before a meal.   Vitamin D (Ergocalciferol) 1.25 MG (50000 UNIT) Caps capsule Commonly known as: DRISDOL Take 1 capsule (50,000 Units total) by mouth every 7 (seven) days.       Disposition: SNF Diet recommendation:  Regular diet  Discharge Exam: Vitals:   11/09/22 2300 11/10/22 1334 11/10/22 2142 11/11/22 0554  BP: (!) 174/71 119/61 (!) 153/75 (!) 167/76  Pulse: 65 80 71 72  Resp: 15 17 18 16   Temp: 98.3 F (36.8 C) 98.5 F (36.9 C) 98.5 F (36.9 C) 98.9 F (37.2 C)  TempSrc: Oral  Oral Oral  SpO2: 98% 97% 96% 98%  Weight:      Height:       General: Appear in no distress; no visible Abnormal Neck Mass Or lumps, Conjunctiva normal Cardiovascular: S1 and S2 Present, no Murmur, Respiratory: good respiratory effort, Bilateral Air entry present and CTA, no Crackles, no wheezes Abdomen: Bowel Sound present, Non tender  Extremities: no Pedal edema Neurology: alert  Filed Weights   11/03/22 0400  Weight: 44.8 kg   Condition at discharge: stable  The results of significant diagnostics from this hospitalization (including imaging, microbiology, ancillary and laboratory) are listed below for reference.   Imaging Studies: DG CHEST PORT 1 VIEW  Result Date: 11/08/2022 CLINICAL DATA:  Chest pain EXAM: PORTABLE CHEST 1 VIEW COMPARISON:  11/03/2022 FINDINGS: Cardiac shadow is within normal limits. Aortic calcifications are noted. Lungs  are well aerated without focal infiltrate. Changes of prior vertebral augmentation are seen. IMPRESSION: No acute abnormality noted. Electronically Signed   By: Alcide Clever M.D.   On: 11/08/2022 19:07   IR KYPHO LUMBAR INC FX REDUCE BONE BX UNI/BIL CANNULATION INC/IMAGING  Result Date: 11/05/2022 CLINICAL DATA:  Symptomatic L2 compression fracture. Please perform image guided vertebral body cement augmentation for therapeutic purposes. EXAM: FLUOROSCOPIC GUIDED KYPHOPLASTY OF THE L2 VERTEBRAL BODY COMPARISON:  None Available. MEDICATIONS: Toradol 30 mg IV; as antibiotic prophylaxis, Ancef 2 gm IV was ordered pre-procedure and administered intravenously within 1 hour of incision. ANESTHESIA/SEDATION: Moderate (conscious) sedation was employed during this procedure. A total  of Versed 1 mg and Fentanyl 75 mcg was administered intravenously. Moderate Sedation Time: 22 minutes. The patient's level of consciousness and vital signs were monitored continuously by radiology nursing throughout the procedure under my direct supervision. FLUOROSCOPY: Minutes, 48 seconds (91 mGy) COMPLICATIONS: None immediate. PROCEDURE: The procedure, risks (including but not limited to bleeding, infection, organ damage), benefits, and alternatives were explained to the patient and the patient's family via the use of a Orthoptist. Questions regarding the procedure were encouraged and answered. The patient understands and consents to the procedure. The patient has suffered a fracture of the L2. It is recommended that patients aged 86 years or older be evaluated for possible testing or treatment of osteoporosis. A copy of this procedure report is sent to the patient's referring physician The patient was placed prone on the fluoroscopic table. The skin overlying the upper thoracic region was then prepped and draped in the usual sterile fashion. Maximal barrier sterile technique was utilized including caps, mask, sterile gowns, sterile gloves, sterile drape, hand hygiene and skin antiseptic. Intravenous Fentanyl and Versed were administered as conscious sedation during continuous cardiorespiratory monitoring by the radiology RN. The left pedicle at L2 was then infiltrated with 1% lidocaine followed by the advancement of a Kyphon trocar needle through the left pedicle into the posterior one-third of the vertebral body. The trocar was removed and a bone biopsy was obtained at this location. Subsequently, the osteo drill was advanced to the anterior third of the vertebral body. The osteo drill was retracted. Through the working cannula, a Kyphon inflatable bone tamp 15 x 3 was advanced and positioned with the distal marker approximately 5 mm from the anterior aspect of the cortex. Appropriate positioning was  confirmed on the AP projection. At this time, the balloon was expanded using contrast via a Kyphon inflation syringe device via micro tubing. Inflations were continued until there was near apposition with the superior end plate. At this time, methylmethacrylate mixture was reconstituted in the Kyphon bone mixing device system. This was then loaded into the delivery mechanism, attached to Kyphon bone fillers. The balloon was deflated and removed followed by the instillation of methylmethacrylate mixture with excellent filling in the AP and lateral projections. No extravasation was noted in the disk spaces or posteriorly into the spinal canal. No epidural venous contamination was seen. The working cannulae and the bone filler were then retrieved and removed. Hemostasis was achieved with manual compression. The patient tolerated the procedure well without immediate postprocedural complication. IMPRESSION: 1. Technically successful L2 vertebral body augmentation using balloon kyphoplasty. 2. Per CMS PQRS reporting requirements (PQRS Measure 24): Given the patient's age of greater than 50 and the fracture site (hip, distal radius, or spine), the patient should be tested for osteoporosis using DXA, and the appropriate treatment considered based on the DXA results.  Electronically Signed   By: Simonne Come M.D.   On: 11/05/2022 14:19   MR THORACIC SPINE WO CONTRAST  Result Date: 11/03/2022 CLINICAL DATA:  Back trauma, abnormal neuro exam, CT or xray positive (Age >= 16y) EXAM: MRI THORACIC SPINE WITHOUT CONTRAST TECHNIQUE: Multiplanar, multisequence MR imaging of the thoracic spine was performed. No intravenous contrast was administered. COMPARISON:  MRI 08/02/2019 FINDINGS: Alignment:  No traumatic listhesis. 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 The degree of height loss has progressed since 2021. No bone marrow edema. Acute L2 compression fracture  seen at the edge of the field of view, as described on dedicated same-day lumbar spine MRI. No additional fractures. No evidence of discitis. No marrow replacing bone lesion. Cord:  Normal signal and morphology. Paraspinal and other soft tissues: Negative. Disc levels: Mild degenerative disc disease throughout the thoracic spine. Moderate multilevel facet joint arthropathy. No high-grade foraminal or canal stenosis at any level. IMPRESSION: 1. No acute fracture or traumatic subluxation of the thoracic spine. 2. 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 The degree of height loss has progressed since 2021 although there is no associated bone marrow edema to suggest recent progression. 3. Mild-to-moderate degenerative changes of the thoracic spine. No high-grade foraminal or canal stenosis at any level. 4. Acute L2 compression fracture seen at the edge of the field of view, as described on dedicated same-day lumbar spine MRI. Electronically Signed   By: Duanne Guess D.O.   On: 11/03/2022 16:48   MR LUMBAR SPINE WO CONTRAST  Result Date: 11/03/2022 CLINICAL DATA:  Back trauma, abnormal neuro exam, CT or xray positive (Age >= 16y) EXAM: MRI LUMBAR SPINE WITHOUT CONTRAST TECHNIQUE: Multiplanar, multisequence MR imaging of the lumbar spine was performed. No intravenous contrast was administered. COMPARISON:  CT 11/02/2022 FINDINGS: Segmentation:  Standard. Alignment:  Physiologic. Vertebrae: Acute compression fracture of the L2 vertebral body with approximately 60% vertebral body height loss. Associated bone marrow edema throughout the vertebral body. Minimal retropulsion at the superior endplate. Remaining lumbar vertebral body heights are maintained. No additional fractures. No evidence of discitis. No marrow replacing bone lesion. Conus medullaris and cauda equina: Conus extends to the L1 level. Conus and cauda equina appear normal. Paraspinal and other soft  tissues: Negative. Disc levels: T12-L1: No significant disc protrusion, foraminal stenosis, or canal stenosis. L1-L2: Mild retropulsion of the L2 superior endplate resulting in slight impress upon the ventral thecal sac and mild canal stenosis. No significant foraminal stenosis. L2-L3: No significant disc protrusion, foraminal stenosis, or canal stenosis. L3-L4: Minimal annular disc bulge and mild facet hypertrophy. Mild bilateral foraminal stenosis. No canal stenosis. L4-L5: Minimal annular disc bulge with mild facet hypertrophy. Mild bilateral foraminal stenosis. No canal stenosis. L5-S1: Minimal annular disc bulge with mild facet hypertrophy. Borderline-mild left foraminal stenosis. No canal stenosis. IMPRESSION: 1. Acute compression fracture of the L2 vertebral body with approximately 60% vertebral body height loss. Minimal retropulsion at the superior endplate resulting in mild canal stenosis. 2. Mild degenerative changes of the lumbar spine with mild bilateral foraminal stenosis at L3-L4 and L4-L5. Electronically Signed   By: Duanne Guess D.O.   On: 11/03/2022 16:41   DG Abd Portable 2 Views  Result Date: 11/03/2022 CLINICAL DATA:  Vomiting EXAM: PORTABLE ABDOMEN - 2 VIEW COMPARISON:  Same day CT pelvis FINDINGS: The bowel gas pattern is normal. Moderate colonic stool load. There is no evidence of  free air. Redemonstrated compression fracture of L2 as seen on CT 10/23/2022. IMPRESSION: No acute radiographic abnormality in the abdomen. Acute or subacute compression fracture of L2. Electronically Signed   By: Minerva Fester M.D.   On: 11/03/2022 03:20   DG Chest Portable 1 View  Result Date: 11/03/2022 CLINICAL DATA:  Vomiting EXAM: PORTABLE CHEST 1 VIEW COMPARISON:  Chest radiograph 12/20/2021 FINDINGS: Stable cardiomediastinal silhouette. Aortic atherosclerotic calcification. Bibasilar atelectasis. No focal consolidation, pleural effusion, or pneumothorax. No displaced rib fractures. IMPRESSION:  No active disease. Electronically Signed   By: Minerva Fester M.D.   On: 11/03/2022 03:18   CT Lumbar Spine Wo Contrast  Addendum Date: 11/03/2022   ADDENDUM REPORT: 11/03/2022 00:00 ADDENDUM: These results were called by telephone at the time of interpretation on 11/02/2022 at 11:59 pm to provider [facility] , who verbally acknowledged these results. Electronically Signed   By: Helyn Numbers M.D.   On: 11/03/2022 00:00   Result Date: 11/03/2022 CLINICAL DATA:  Back trauma, no prior imaging (Age >= 16y), lumbosacral pain, unable to ambulate. EXAM: CT LUMBAR SPINE WITHOUT CONTRAST TECHNIQUE: Multidetector CT imaging of the lumbar spine was performed without intravenous contrast administration. Multiplanar CT image reconstructions were also generated. RADIATION DOSE REDUCTION: This exam was performed according to the departmental dose-optimization program which includes automated exposure control, adjustment of the mA and/or kV according to patient size and/or use of iterative reconstruction technique. COMPARISON:  None Available. FINDINGS: Segmentation: 5 lumbar type vertebrae. Alignment: Mild straightening of the lumbar spine.  No listhesis. Vertebrae: The osseous structures are diffusely osteopenic. There is an acute to subacute compression fracture of L2 with approximately 50% loss of height. The fracture plane is seen extending obliquely in the coronal plane and involving the anterior, inferior, and superior vertebral cortices. The posterior cortex appears intact though there is minimal retropulsion of the posterosuperior aspect of the L2 vertebral body by approximately 3 mm. Posterior elements appear intact. Remaining vertebral body height appears preserved. Paraspinal and other soft tissues: Mild paravertebral infiltration noted at L2 in keeping with a trace amount of interstitial hemorrhage or edema. No paraspinal fluid collections. Atherosclerotic calcification noted within the abdominal aorta.  Disc levels: Mild retropulsion of the posterosuperior aspect of L2 results in minimal canal stenosis but no significant neuroforaminal narrowing. Broad-based posterior disc bulge at L4-5 in combination with mild facet arthrosis and laminar hypertrophy results in mild central canal stenosis but no significant neuroforaminal narrowing. No high-grade canal stenosis or neuroforaminal narrowing identified. IMPRESSION: 1. Acute to subacute compression fracture of L2 with approximately 50% loss of height. Minimal retropulsion of the posterosuperior aspect of the L2 vertebral body by approximately 3 mm results in minimal canal stenosis but no significant neuroforaminal narrowing. 2. Diffuse osteopenia. 3. 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 4. Aortic atherosclerosis. Aortic Atherosclerosis (ICD10-I70.0). Electronically Signed: By: Helyn Numbers M.D. On: 11/02/2022 23:51   CT PELVIS WO CONTRAST  Result Date: 11/02/2022 CLINICAL DATA:  Hip trauma, fracture suspected. Increasing sacral pain and unwillingness to ambulate. EXAM: CT PELVIS WITHOUT CONTRAST TECHNIQUE: Multidetector CT imaging of the pelvis was performed following the standard protocol without intravenous contrast. RADIATION DOSE REDUCTION: This exam was performed according to the departmental dose-optimization program which includes automated exposure control, adjustment of the mA and/or kV according to patient size and/or use of iterative reconstruction technique. COMPARISON:  None Available. FINDINGS: Urinary Tract:  No abnormality visualized. Bowel:  Unremarkable visualized pelvic bowel  loops. Vascular/Lymphatic: Aortic atherosclerotic calcification. No lymphadenopathy. Reproductive:  Pessary.  No acute abnormality. Other:  None. Musculoskeletal: Demineralization. No acute fracture or dislocation. Degenerative changes both hips, pubic symphysis, and SI joints. IMPRESSION: 1. No acute  fracture or dislocation. Aortic Atherosclerosis (ICD10-I70.0). Electronically Signed   By: Minerva Fester M.D.   On: 11/02/2022 23:45   CT Head Wo Contrast  Result Date: 11/02/2022 CLINICAL DATA:  Head trauma 4 days ago. Decreased appetite and weakness. Neck trauma. EXAM: CT HEAD WITHOUT CONTRAST CT CERVICAL SPINE WITHOUT CONTRAST TECHNIQUE: Multidetector CT imaging of the head and cervical spine was performed following the standard protocol without intravenous contrast. Multiplanar CT image reconstructions of the cervical spine were also generated. RADIATION DOSE REDUCTION: This exam was performed according to the departmental dose-optimization program which includes automated exposure control, adjustment of the mA and/or kV according to patient size and/or use of iterative reconstruction technique. COMPARISON:  None Available. FINDINGS: CT HEAD FINDINGS Brain: No intracranial hemorrhage, mass effect, or evidence of acute infarct. No hydrocephalus. No extra-axial fluid collection. Generalized cerebral atrophy. Ill-defined hypoattenuation within the cerebral white matter is nonspecific but consistent with chronic small vessel ischemic disease. Vascular: No hyperdense vessel. Intracranial arterial calcification. Skull: No fracture or focal lesion. Sinuses/Orbits: No acute finding. Frothy secretions in the sphenoid sinuses. Mucosal thickening in the left maxillary sinus. No mastoid effusion. Other: None. CT CERVICAL SPINE FINDINGS Alignment: No evidence of traumatic malalignment. Skull base and vertebrae: No acute fracture. No primary bone lesion or focal pathologic process. Soft tissues and spinal canal: No prevertebral fluid or swelling. No visible canal hematoma. Disc levels: Intervertebral disc space height is maintained. No severe spinal canal or neural foraminal narrowing. Upper chest: No acute abnormality. Other: Carotid calcification. IMPRESSION: 1. No acute intracranial abnormality. 2. No acute fracture  in the cervical spine. Electronically Signed   By: Minerva Fester M.D.   On: 11/02/2022 23:40   CT Cervical Spine Wo Contrast  Result Date: 11/02/2022 CLINICAL DATA:  Head trauma 4 days ago. Decreased appetite and weakness. Neck trauma. EXAM: CT HEAD WITHOUT CONTRAST CT CERVICAL SPINE WITHOUT CONTRAST TECHNIQUE: Multidetector CT imaging of the head and cervical spine was performed following the standard protocol without intravenous contrast. Multiplanar CT image reconstructions of the cervical spine were also generated. RADIATION DOSE REDUCTION: This exam was performed according to the departmental dose-optimization program which includes automated exposure control, adjustment of the mA and/or kV according to patient size and/or use of iterative reconstruction technique. COMPARISON:  None Available. FINDINGS: CT HEAD FINDINGS Brain: No intracranial hemorrhage, mass effect, or evidence of acute infarct. No hydrocephalus. No extra-axial fluid collection. Generalized cerebral atrophy. Ill-defined hypoattenuation within the cerebral white matter is nonspecific but consistent with chronic small vessel ischemic disease. Vascular: No hyperdense vessel. Intracranial arterial calcification. Skull: No fracture or focal lesion. Sinuses/Orbits: No acute finding. Frothy secretions in the sphenoid sinuses. Mucosal thickening in the left maxillary sinus. No mastoid effusion. Other: None. CT CERVICAL SPINE FINDINGS Alignment: No evidence of traumatic malalignment. Skull base and vertebrae: No acute fracture. No primary bone lesion or focal pathologic process. Soft tissues and spinal canal: No prevertebral fluid or swelling. No visible canal hematoma. Disc levels: Intervertebral disc space height is maintained. No severe spinal canal or neural foraminal narrowing. Upper chest: No acute abnormality. Other: Carotid calcification. IMPRESSION: 1. No acute intracranial abnormality. 2. No acute fracture in the cervical spine.  Electronically Signed   By: Minerva Fester M.D.   On: 11/02/2022 23:40  Microbiology: Results for orders placed or performed in visit on 12/18/21  Urine Culture     Status: Abnormal   Collection Time: 12/18/21 12:05 PM   Specimen: Blood   UR  Result Value Ref Range Status   Urine Culture, Routine Final report (A)  Final   Organism ID, Bacteria Comment (A)  Final    Comment: Pseudomonas aeruginosa 50,000-100,000 colony forming units per mL    Antimicrobial Susceptibility Comment  Final    Comment:       ** S = Susceptible; I = Intermediate; R = Resistant **                    P = Positive; N = Negative             MICS are expressed in micrograms per mL    Antibiotic                 RSLT#1    RSLT#2    RSLT#3    RSLT#4 Amikacin                       S Cefepime                       S Ceftazidime                    S Ciprofloxacin                  S Gentamicin                     S Imipenem                       S Levofloxacin                   S Meropenem                      S Piperacillin                   S Ticarcillin                    S Tobramycin                     S    Labs: CBC: Recent Labs  Lab 11/05/22 0408 11/10/22 0403 11/10/22 1157  WBC 6.0 6.0 6.5  NEUTROABS  --   --  4.8  HGB 9.7* 7.2* 8.0*  HCT 29.3* 22.7* 25.0*  MCV 95.4 97.8 97.3  PLT 173 187 212   Basic Metabolic Panel: Recent Labs  Lab 11/05/22 0408 11/07/22 0835 11/10/22 0403 11/10/22 1157  NA 137 138 139 137  K 4.0 4.2 4.3 4.1  CL 105 107 106 104  CO2 22 25 27 25   GLUCOSE 118* 119* 108* 123*  BUN 39* 37* 33* 30*  CREATININE 1.83* 1.97* 2.22* 1.99*  CALCIUM 8.7* 8.2* 8.1* 8.3*  MG  --   --  1.8  --    Liver Function Tests: No results for input(s): "AST", "ALT", "ALKPHOS", "BILITOT", "PROT", "ALBUMIN" in the last 168 hours. CBG: No results for input(s): "GLUCAP" in the last 168 hours.  Discharge time spent: greater than 30 minutes.  Author: Lynden Oxford, MD  [facility]

## 2022-11-11 NOTE — TOC Transition Note (Addendum)
Transition of Care [facility]) - CM/SW Discharge Note   Patient Details  Name: Danielle Houston MRN: [id number] Date of Birth: 11-Jul-1940  Transition of Care [facility]) CM/SW Contact:  Darleene Cleaver, LCSW Phone Number: 11/11/2022, 12:11 PM   Clinical Narrative:     CSW spoke to [facility] [place], they can accept patient today.  CSW attempted to contact patient's son Debbe Odea, on [phone], had to leave a secure voice mail.  Patient to be d/c'ed today to [facility], room [address].    Patient and family agreeable to plans will transport via ems RN to call report [phone].  CSW to sign off, please reconsult if other TOC needs arise.  EMS called at 11:46am, bedside nurse made aware.    Final next level of care: Skilled Nursing Facility Barriers to Discharge: Barriers Resolved   Patient Goals and CMS Choice CMS Medicare.gov Compare Post Acute Care list provided to:: Patient Represenative (must comment) Choice offered to / list presented to : Adult Children  Discharge Placement     Existing PASRR number confirmed : 11/05/22          Patient [facility] bed at: [facility] Patient to be transferred to facility by: [facility] Name of family member notified: Left voice mail on phone of patient's son Cephus Slater, [phone]. Patient and family notified of of transfer: 11/11/22  Discharge Plan and Services Additional resources added to the After Visit Summary for   In-house Referral: Clinical Social Work                                   Social Determinants of Health (SDOH) Interventions SDOH Screenings   Food Insecurity: No Food Insecurity (11/03/2022)  Housing: Low Risk  (11/03/2022)  Transportation Needs: No Transportation Needs (11/03/2022)  Utilities: Not At Risk (11/03/2022)  Depression (PHQ2-9): High Risk (12/30/2021)  Tobacco Use: Low Risk  (11/03/2022)     Readmission Risk Interventions     No data to display

## 2022-11-11 NOTE — Plan of Care (Signed)
?  Problem: Activity: ?Goal: Risk for activity intolerance will decrease ?Outcome: Progressing ?  ?Problem: Safety: ?Goal: Ability to remain free from injury will improve ?Outcome: Progressing ?  ?Problem: Pain Managment: ?Goal: General experience of comfort will improve ?Outcome: Progressing ?  ?

## 2022-11-19 ENCOUNTER — Other Ambulatory Visit: Payer: Self-pay

## 2023-03-12 ENCOUNTER — Inpatient Hospital Stay: Payer: Medicaid Other | Attending: Oncology | Admitting: Oncology

## 2023-03-12 ENCOUNTER — Other Ambulatory Visit: Payer: Self-pay

## 2023-03-12 ENCOUNTER — Encounter: Payer: Self-pay | Admitting: Oncology

## 2023-03-12 ENCOUNTER — Inpatient Hospital Stay: Payer: Medicaid Other

## 2023-03-12 VITALS — BP 192/80 | HR 74 | Resp 17 | Wt 119.4 lb

## 2023-03-12 DIAGNOSIS — N184 Chronic kidney disease, stage 4 (severe): Secondary | ICD-10-CM | POA: Insufficient documentation

## 2023-03-12 DIAGNOSIS — D631 Anemia in chronic kidney disease: Secondary | ICD-10-CM | POA: Insufficient documentation

## 2023-03-12 DIAGNOSIS — I129 Hypertensive chronic kidney disease with stage 1 through stage 4 chronic kidney disease, or unspecified chronic kidney disease: Secondary | ICD-10-CM

## 2023-03-12 DIAGNOSIS — D649 Anemia, unspecified: Secondary | ICD-10-CM

## 2023-03-12 LAB — CBC WITH DIFFERENTIAL/PLATELET
Abs Immature Granulocytes: 0.02 10*3/uL (ref 0.00–0.07)
Basophils Absolute: 0 10*3/uL (ref 0.0–0.1)
Basophils Relative: 0 %
Eosinophils Absolute: 0.1 10*3/uL (ref 0.0–0.5)
Eosinophils Relative: 2 %
HCT: 25.5 % — ABNORMAL LOW (ref 36.0–46.0)
Hemoglobin: 8.3 g/dL — ABNORMAL LOW (ref 12.0–15.0)
Immature Granulocytes: 0 %
Lymphocytes Relative: 20 %
Lymphs Abs: 1.5 10*3/uL (ref 0.7–4.0)
MCH: 31.8 pg (ref 26.0–34.0)
MCHC: 32.5 g/dL (ref 30.0–36.0)
MCV: 97.7 fL (ref 80.0–100.0)
Monocytes Absolute: 0.5 10*3/uL (ref 0.1–1.0)
Monocytes Relative: 7 %
Neutro Abs: 5.4 10*3/uL (ref 1.7–7.7)
Neutrophils Relative %: 71 %
Platelets: 182 10*3/uL (ref 150–400)
RBC: 2.61 MIL/uL — ABNORMAL LOW (ref 3.87–5.11)
RDW: 13.7 % (ref 11.5–15.5)
WBC: 7.7 10*3/uL (ref 4.0–10.5)
nRBC: 0 % (ref 0.0–0.2)

## 2023-03-12 LAB — COMPREHENSIVE METABOLIC PANEL
ALT: 9 U/L (ref 0–44)
AST: 16 U/L (ref 15–41)
Albumin: 4.2 g/dL (ref 3.5–5.0)
Alkaline Phosphatase: 87 U/L (ref 38–126)
Anion gap: 7 (ref 5–15)
BUN: 48 mg/dL — ABNORMAL HIGH (ref 8–23)
CO2: 24 mmol/L (ref 22–32)
Calcium: 9.3 mg/dL (ref 8.9–10.3)
Chloride: 108 mmol/L (ref 98–111)
Creatinine, Ser: 2.28 mg/dL — ABNORMAL HIGH (ref 0.44–1.00)
GFR, Estimated: 21 mL/min — ABNORMAL LOW (ref >60)
Glucose, Bld: 138 mg/dL — ABNORMAL HIGH (ref 70–99)
Potassium: 4.4 mmol/L (ref 3.5–5.1)
Sodium: 139 mmol/L (ref 135–145)
Total Bilirubin: 0.4 mg/dL (ref <1.2)
Total Protein: 7.9 g/dL (ref 6.5–8.1)

## 2023-03-12 LAB — RETICULOCYTES
Immature Retic Fract: 8.5 % (ref 2.3–15.9)
RBC.: 2.53 MIL/uL — ABNORMAL LOW (ref 3.87–5.11)
Retic Count, Absolute: 41 10*3/uL (ref 19.0–186.0)
Retic Ct Pct: 1.6 % (ref 0.4–3.1)

## 2023-03-12 LAB — VITAMIN B12: Vitamin B-12: 338 pg/mL (ref 180–914)

## 2023-03-12 LAB — FOLATE: Folate: 22.1 ng/mL (ref >5.9)

## 2023-03-12 LAB — DIRECT ANTIGLOBULIN TEST (NOT AT ARMC)
DAT, IgG: NEGATIVE
DAT, complement: NEGATIVE

## 2023-03-12 LAB — LACTATE DEHYDROGENASE: LDH: 192 U/L (ref 98–192)

## 2023-03-12 NOTE — Progress Notes (Signed)
[facility]  HEMATOLOGY CLINIC CONSULTATION NOTE    PATIENT NAME: Danielle Houston   MR#: [id number] DOB: 1940-04-26  DATE OF SERVICE: 03/12/2023  Patient Care Team: Hoy Register, MD as PCP - General (Family Medicine)  REASON FOR CONSULTATION/ CHIEF COMPLAINT:  Evaluation of anemia.  HISTORY OF PRESENT ILLNESS:  Danielle Houston is a 82 y.o. asian lady with a past medical history of hypertension, hypothyroidism, CKD, osteoporosis, CAD, dyslipidemia, gout, was referred to our service for evaluation of anemia.    Discussed the use of AI scribe software for clinical note transcription with the patient, who gave verbal consent to proceed.   She speaks a particular dialect of Mandarin and we had to use the help of the interpreter and also her son to communicate with her today.  On 02/25/2023, labs showed hemoglobin of 7.8.  Creatinine 2.1, BUN 45, otherwise unremarkable CMP.  Iron studies showed no evidence of iron deficiency.  Folate was normal at 24.  B12 normal at 430.  Previously labs on 01/26/2023 showed hemoglobin of 7.6, hematocrit 24.1, MCV 101.3.  White count and platelet count were within normal limits.  Hemoglobin electrophoresis showed normal phenotype.  On review of records, hemoglobin has been in the range of 7-8 from September 2024.  Given persistent anemia, referral was sent to [place] for further evaluation.  She denies recent chest pain on exertion, shortness of breath on minimal exertion, pre-syncopal episodes, or palpitations.  The patient denies any obvious blood loss, but reports experiencing dry mouth and finding blood in her mouth in the mornings.   MEDICAL HISTORY:  Past Medical History:  Diagnosis Date   Coronary artery disease    Hypertension    Hypothyroidism     SURGICAL HISTORY: Past Surgical History:  Procedure Laterality Date   CARDIAC CATHETERIZATION     CORONARY ANGIOPLASTY     IR KYPHO LUMBAR INC FX REDUCE BONE BX UNI/BIL CANNULATION  INC/IMAGING  11/05/2022    SOCIAL HISTORY: She reports that she has never smoked. She has never used smokeless tobacco. She reports that she does not drink alcohol and does not use drugs. Social History   Socioeconomic History   Marital status: Single    Spouse name: Not on file   Number of children: Not on file   Years of education: Not on file   Highest education level: Not on file  Occupational History   Not on file  Tobacco Use   Smoking status: Never   Smokeless tobacco: Never  Substance and Sexual Activity   Alcohol use: Never   Drug use: Never   Sexual activity: Not on file  Other Topics Concern   Not on file  Social History Narrative   Not on file   Social Drivers of Health   Financial Resource Strain: Not on file  Food Insecurity: No Food Insecurity (11/03/2022)   Hunger Vital Sign    Worried About Running Out of Food in the Last Year: Never true    Ran Out of Food in the Last Year: Never true  Transportation Needs: No Transportation Needs (11/03/2022)   PRAPARE - Administrator, Civil Service (Medical): No    Lack of Transportation (Non-Medical): No  Physical Activity: Not on file  Stress: Not on file  Social Connections: Not on file  Intimate Partner Violence: Not At Risk (11/03/2022)   Humiliation, Afraid, Rape, and Kick questionnaire    Fear of Current or Ex-Partner: No    Emotionally Abused:  No    Physically Abused: No    Sexually Abused: No    FAMILY HISTORY: Family History  Problem Relation Age of Onset   CAD Neg Hx     ALLERGIES:  She has no known allergies.  MEDICATIONS:  Current Outpatient Medications  Medication Sig Dispense Refill   alendronate (FOSAMAX) 70 MG tablet Take 1 tablet (70 mg total) by mouth every 7 (seven) days. Take with a full glass of water on an empty stomach. 12 tablet 1   allopurinol (ZYLOPRIM) 100 MG tablet Take 0.5 tablets (50 mg total) by mouth every other day. 15 tablet 6   aspirin EC 81 MG tablet Take 1  tablet (81 mg total) by mouth daily. Swallow whole. 30 tablet 11   atorvastatin (LIPITOR) 40 MG tablet TAKE 1 TABLET (40 MG TOTAL) BY MOUTH DAILY. 90 tablet 1   carvedilol (COREG) 12.5 MG tablet TAKE 1 TABLET (12.5 MG TOTAL) BY MOUTH 2 (TWO) TIMES DAILY WITH A MEAL. 180 tablet 1   diclofenac Sodium (VOLTAREN) 1 % GEL APPLY 2 Grams TOPICALLY 4 (FOUR) TIMES DAILY as needed for PAIN 100 g 1   hydrALAZINE (APRESOLINE) 100 MG tablet Take 1 tablet (100 mg total) by mouth 2 (two) times daily. 90 tablet 6   isosorbide mononitrate (IMDUR) 120 MG 24 hr tablet Take 1 tablet (120 mg total) by mouth daily. 90 tablet 1   lidocaine (LIDODERM) 5 % Place 1 patch onto the skin daily. Remove & Discard patch within 12 hours or as directed by MD 30 patch 1   pantoprazole (PROTONIX) 40 MG tablet Take 1 tablet (40 mg total) by mouth 2 (two) times daily before a meal. 60 tablet 0   Vitamin D, Ergocalciferol, (DRISDOL) 1.25 MG (50000 UNIT) CAPS capsule Take 1 capsule (50,000 Units total) by mouth every 7 (seven) days. 5 capsule 0   No current facility-administered medications for this visit.    REVIEW OF SYSTEMS:    Review of Systems - Oncology  All other pertinent systems were reviewed and were negative except as mentioned above.  PHYSICAL EXAMINATION:  ECOG PERFORMANCE STATUS: 1 - Symptomatic but completely ambulatory  Vitals:   03/12/23 1505  BP: (!) 192/80  Pulse: 74  Resp: 17  SpO2: 96%   Filed Weights   03/12/23 1505  Weight: 119 lb 6.4 oz (54.2 kg)    Physical Exam Constitutional:      General: She is not in acute distress.    Appearance: Normal appearance.  HENT:     Head: Normocephalic and atraumatic.  Eyes:     General: No scleral icterus.    Conjunctiva/sclera: Conjunctivae normal.  Cardiovascular:     Rate and Rhythm: Normal rate and regular rhythm.     Heart sounds: Normal heart sounds.  Pulmonary:     Effort: Pulmonary effort is normal.     Breath sounds: Normal breath sounds.   Abdominal:     General: There is no distension.  Musculoskeletal:     Right lower leg: No edema.     Left lower leg: No edema.  Neurological:     General: No focal deficit present.     Mental Status: She is alert.     LABORATORY DATA:   I have reviewed the data as listed.  Results for orders placed or performed in visit on 03/12/23  CBC with Differential/Platelet  Result Value Ref Range   WBC 7.7 4.0 - 10.5 K/uL   RBC 2.61 (L) 3.87 -  5.11 MIL/uL   Hemoglobin 8.3 (L) 12.0 - 15.0 g/dL   HCT 82.9 (L) 56.2 - 13.0 %   MCV 97.7 80.0 - 100.0 fL   MCH 31.8 26.0 - 34.0 pg   MCHC 32.5 30.0 - 36.0 g/dL   RDW 86.5 78.4 - 69.6 %   Platelets 182 150 - 400 K/uL   nRBC 0.0 0.0 - 0.2 %   Neutrophils Relative % 71 %   Neutro Abs 5.4 1.7 - 7.7 K/uL   Lymphocytes Relative 20 %   Lymphs Abs 1.5 0.7 - 4.0 K/uL   Monocytes Relative 7 %   Monocytes Absolute 0.5 0.1 - 1.0 K/uL   Eosinophils Relative 2 %   Eosinophils Absolute 0.1 0.0 - 0.5 K/uL   Basophils Relative 0 %   Basophils Absolute 0.0 0.0 - 0.1 K/uL   Immature Granulocytes 0 %   Abs Immature Granulocytes 0.02 0.00 - 0.07 K/uL  Reticulocytes  Result Value Ref Range   Retic Ct Pct 1.6 0.4 - 3.1 %   RBC. 2.53 (L) 3.87 - 5.11 MIL/uL   Retic Count, Absolute 41.0 19.0 - 186.0 K/uL   Immature Retic Fract 8.5 2.3 - 15.9 %  Comprehensive metabolic panel  Result Value Ref Range   Sodium 139 135 - 145 mmol/L   Potassium 4.4 3.5 - 5.1 mmol/L   Chloride 108 98 - 111 mmol/L   CO2 24 22 - 32 mmol/L   Glucose, Bld 138 (H) 70 - 99 mg/dL   BUN 48 (H) 8 - 23 mg/dL   Creatinine, Ser 2.95 (H) 0.44 - 1.00 mg/dL   Calcium 9.3 8.9 - 28.4 mg/dL   Total Protein 7.9 6.5 - 8.1 g/dL   Albumin 4.2 3.5 - 5.0 g/dL   AST 16 15 - 41 U/L   ALT 9 0 - 44 U/L   Alkaline Phosphatase 87 38 - 126 U/L   Total Bilirubin 0.4 <1.2 mg/dL   GFR, Estimated 21 (L) >60 mL/min   Anion gap 7 5 - 15     RADIOGRAPHIC STUDIES:  No pertinent imaging studies available  to review.   ASSESSMENT & PLAN:   82 y.o. asian lady with a past medical history of hypertension, hypothyroidism, CKD, osteoporosis, CAD, dyslipidemia, gout, was referred to our service for evaluation of anemia.    Anemia Chronic anemia since 2023, with hemoglobin consistently between 7 and 8. No obvious blood loss reported. Patient has chronic kidney disease which could contribute to anemia. Iron labs and hemoglobin electrophoresis from primary care physician were normal.  -Labs today revealed hemoglobin of 8.3, hematocrit 25.5, MCV 97.7.  White count, platelet count are normal.  Creatinine 2.28, BUN 48.  Otherwise unremarkable CMP.  -We will check B12, folate, methylmalonic acid, haptoglobin, Coombs test, SPEP/IFE, serum free light chains to look for possible etiologies for her anemia.  -Most likely explanation for her normocytic anemia is anemia of chronic disease, related to CKD.  If hemoglobin remains consistently below 9, we could consider monthly EPO simulating agents like Retacrit or Aranesp in an attempt to improve her hemoglobin.  -I will plan to evaluate her in 6 weeks with repeat labs and determine further course of action.  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  Stable renal function overall.  We will continue to monitor renal function    Orders Placed This Encounter  Procedures   CBC with Differential/Platelet    Standing Status:   Future  Number of Occurrences:   1    Expiration Date:   03/11/2024   Comprehensive metabolic panel    Standing Status:   Future    Number of Occurrences:   1    Expiration Date:   03/11/2024   Lactate dehydrogenase    Standing Status:   Future    Number of Occurrences:   1    Expiration Date:   03/11/2024   Haptoglobin    Standing Status:   Future    Number of Occurrences:   1    Expiration Date:   03/11/2024   Folate    Standing Status:   Future    Number of Occurrences:   1     Expiration Date:   03/11/2024   Vitamin B12    Standing Status:   Future    Number of Occurrences:   1    Expiration Date:   03/11/2024   Methylmalonic acid, serum    Standing Status:   Future    Number of Occurrences:   1    Expiration Date:   03/11/2024   Reticulocytes    Standing Status:   Future    Number of Occurrences:   1    Expiration Date:   03/11/2024   Multiple Myeloma Panel (SPEP&IFE w/QIG)    Standing Status:   Future    Number of Occurrences:   1    Expiration Date:   03/11/2024   Kappa/lambda light chains    Standing Status:   Future    Number of Occurrences:   1    Expiration Date:   03/11/2024   Direct antiglobulin test (not at [facility])    Standing Status:   Future    Number of Occurrences:   1    Expiration Date:   03/11/2024     I spent a total of 55 minutes during this encounter with the patient including review of chart and various tests results, discussions about plan of care and coordination of care plan.  I reviewed lab results and outside records for this visit and discussed relevant results with the patient. Diagnosis, plan of care and treatment options were also discussed in detail with the patient. Opportunity provided to ask questions and answers provided to her apparent satisfaction. Provided instructions to call our clinic with any problems, questions or concerns prior to return visit. I recommended to continue follow-up with PCP and sub-specialists. She verbalized understanding and agreed with the plan. No barriers to learning was detected.   Future Appointments  Date Time Provider Department Center  04/23/2023  2:30 PM [facility] None  04/23/2023  3:00 PM Eddith Mentor, Archie Patten, MD [facility] None     [name], MD [facility] [address] [place] [address] Dept: [phone] Dept Fax: [phone]  03/12/2023 3:20 PM   This document  was completed utilizing speech recognition software. Grammatical errors, random word insertions, pronoun errors, and incomplete sentences are an occasional consequence of this system due to software limitations, ambient noise, and hardware issues. Any formal questions or concerns about the content, text or information contained within the body of this dictation should be directly addressed to the provider for clarification.

## 2023-03-12 NOTE — Assessment & Plan Note (Signed)
-  CKD stage IV.  Stable renal function overall.  We will continue to monitor renal function

## 2023-03-12 NOTE — Assessment & Plan Note (Signed)
Chronic anemia since 2023, with hemoglobin consistently between 7 and 8. No obvious blood loss reported. Patient has chronic kidney disease which could contribute to anemia. Iron labs and hemoglobin electrophoresis from primary care physician were normal.  -Labs today revealed hemoglobin of 8.3, hematocrit 25.5, MCV 97.7.  White count, platelet count are normal.  Creatinine 2.28, BUN 48.  Otherwise unremarkable CMP.  -We will check B12, folate, methylmalonic acid, haptoglobin, Coombs test, SPEP/IFE, serum free light chains to look for possible etiologies for her anemia.  -Most likely explanation for her normocytic anemia is anemia of chronic disease, related to CKD.  If hemoglobin remains consistently below 9, we could consider monthly EPO simulating agents like Retacrit or Aranesp in an attempt to improve her hemoglobin.  -I will plan to evaluate her in 6 weeks with repeat labs and determine further course of action.

## 2023-03-13 LAB — KAPPA/LAMBDA LIGHT CHAINS
Kappa free light chain: 96.2 mg/L — ABNORMAL HIGH (ref 3.3–19.4)
Kappa, lambda light chain ratio: 1.35 (ref 0.26–1.65)
Lambda free light chains: 71.4 mg/L — ABNORMAL HIGH (ref 5.7–26.3)

## 2023-03-13 LAB — HAPTOGLOBIN: Haptoglobin: 189 mg/dL (ref 41–333)

## 2023-03-14 LAB — METHYLMALONIC ACID, SERUM: Methylmalonic Acid, Quantitative: 260 nmol/L (ref 0–378)

## 2023-03-22 LAB — MULTIPLE MYELOMA PANEL, SERUM
Albumin SerPl Elph-Mcnc: 4.1 g/dL (ref 2.9–4.4)
Albumin/Glob SerPl: 1.3 (ref 0.7–1.7)
Alpha 1: 0.2 g/dL (ref 0.0–0.4)
Alpha2 Glob SerPl Elph-Mcnc: 0.6 g/dL (ref 0.4–1.0)
B-Globulin SerPl Elph-Mcnc: 0.7 g/dL (ref 0.7–1.3)
Gamma Glob SerPl Elph-Mcnc: 1.6 g/dL (ref 0.4–1.8)
Globulin, Total: 3.2 g/dL (ref 2.2–3.9)
IgA: 127 mg/dL (ref 64–422)
IgG (Immunoglobin G), Serum: 1966 mg/dL — ABNORMAL HIGH (ref 586–1602)
IgM (Immunoglobulin M), Srm: 68 mg/dL (ref 26–217)
Total Protein ELP: 7.3 g/dL (ref 6.0–8.5)

## 2023-04-23 ENCOUNTER — Inpatient Hospital Stay: Payer: Medicaid Other

## 2023-04-23 ENCOUNTER — Other Ambulatory Visit: Payer: Self-pay | Admitting: Oncology

## 2023-04-23 ENCOUNTER — Telehealth: Payer: Self-pay

## 2023-04-23 ENCOUNTER — Inpatient Hospital Stay: Payer: Medicaid Other | Admitting: Oncology

## 2023-04-23 ENCOUNTER — Telehealth: Payer: Self-pay | Admitting: Oncology

## 2023-04-23 DIAGNOSIS — D631 Anemia in chronic kidney disease: Secondary | ICD-10-CM

## 2023-04-23 NOTE — Telephone Encounter (Signed)
 Called patient twice to remind her that she had appointment today that was missed. No answer.

## 2023-04-23 NOTE — Telephone Encounter (Signed)
 Danielle Houston

## 2023-05-20 ENCOUNTER — Other Ambulatory Visit: Payer: Self-pay

## 2023-05-21 ENCOUNTER — Other Ambulatory Visit: Payer: Self-pay

## 2023-05-21 ENCOUNTER — Inpatient Hospital Stay: Payer: Medicaid Other | Attending: Oncology

## 2023-05-21 ENCOUNTER — Inpatient Hospital Stay (HOSPITAL_BASED_OUTPATIENT_CLINIC_OR_DEPARTMENT_OTHER): Payer: Medicaid Other | Admitting: Oncology

## 2023-05-21 VITALS — BP 201/80 | HR 73 | Temp 97.3°F | Resp 16 | Wt 123.7 lb

## 2023-05-21 DIAGNOSIS — R42 Dizziness and giddiness: Secondary | ICD-10-CM

## 2023-05-21 DIAGNOSIS — D649 Anemia, unspecified: Secondary | ICD-10-CM

## 2023-05-21 DIAGNOSIS — R768 Other specified abnormal immunological findings in serum: Secondary | ICD-10-CM | POA: Diagnosis not present

## 2023-05-21 DIAGNOSIS — N189 Chronic kidney disease, unspecified: Secondary | ICD-10-CM | POA: Insufficient documentation

## 2023-05-21 DIAGNOSIS — N814 Uterovaginal prolapse, unspecified: Secondary | ICD-10-CM | POA: Insufficient documentation

## 2023-05-21 DIAGNOSIS — N1832 Chronic kidney disease, stage 3b: Secondary | ICD-10-CM

## 2023-05-21 LAB — COMPREHENSIVE METABOLIC PANEL
ALT: 11 U/L (ref 0–44)
AST: 18 U/L (ref 15–41)
Albumin: 4.4 g/dL (ref 3.5–5.0)
Alkaline Phosphatase: 82 U/L (ref 38–126)
Anion gap: 6 (ref 5–15)
BUN: 59 mg/dL — ABNORMAL HIGH (ref 8–23)
CO2: 25 mmol/L (ref 22–32)
Calcium: 9.3 mg/dL (ref 8.9–10.3)
Chloride: 109 mmol/L (ref 98–111)
Creatinine, Ser: 2.6 mg/dL — ABNORMAL HIGH (ref 0.44–1.00)
GFR, Estimated: 18 mL/min — ABNORMAL LOW (ref >60)
Glucose, Bld: 128 mg/dL — ABNORMAL HIGH (ref 70–99)
Potassium: 5 mmol/L (ref 3.5–5.1)
Sodium: 140 mmol/L (ref 135–145)
Total Bilirubin: 0.3 mg/dL (ref 0.0–1.2)
Total Protein: 7.8 g/dL (ref 6.5–8.1)

## 2023-05-21 LAB — CBC WITH DIFFERENTIAL/PLATELET
Abs Immature Granulocytes: 0.02 10*3/uL (ref 0.00–0.07)
Basophils Absolute: 0 10*3/uL (ref 0.0–0.1)
Basophils Relative: 1 %
Eosinophils Absolute: 0.2 10*3/uL (ref 0.0–0.5)
Eosinophils Relative: 3 %
HCT: 26.4 % — ABNORMAL LOW (ref 36.0–46.0)
Hemoglobin: 8.7 g/dL — ABNORMAL LOW (ref 12.0–15.0)
Immature Granulocytes: 0 %
Lymphocytes Relative: 25 %
Lymphs Abs: 1.6 10*3/uL (ref 0.7–4.0)
MCH: 32.1 pg (ref 26.0–34.0)
MCHC: 33 g/dL (ref 30.0–36.0)
MCV: 97.4 fL (ref 80.0–100.0)
Monocytes Absolute: 0.5 10*3/uL (ref 0.1–1.0)
Monocytes Relative: 7 %
Neutro Abs: 3.9 10*3/uL (ref 1.7–7.7)
Neutrophils Relative %: 64 %
Platelets: 174 10*3/uL (ref 150–400)
RBC: 2.71 MIL/uL — ABNORMAL LOW (ref 3.87–5.11)
RDW: 12.7 % (ref 11.5–15.5)
WBC: 6.2 10*3/uL (ref 4.0–10.5)
nRBC: 0 % (ref 0.0–0.2)

## 2023-05-21 LAB — FERRITIN: Ferritin: 307 ng/mL (ref 11–307)

## 2023-05-21 LAB — IRON AND IRON BINDING CAPACITY (CC-WL,HP ONLY)
Iron: 95 ug/dL (ref 28–170)
Saturation Ratios: 38 % — ABNORMAL HIGH (ref 10.4–31.8)
TIBC: 248 ug/dL — ABNORMAL LOW (ref 250–450)
UIBC: 153 ug/dL (ref 148–442)

## 2023-05-21 MED ORDER — MECLIZINE HCL 12.5 MG PO TABS
12.5000 mg | ORAL_TABLET | Freq: Three times a day (TID) | ORAL | 0 refills | Status: DC | PRN
  Filled 2023-05-21: qty 30, 10d supply, fill #0

## 2023-05-21 NOTE — Progress Notes (Signed)
 [facility]  HEMATOLOGY CLINIC PROGRESS NOTE  PATIENT NAME: Danielle Houston   MR#: [id number] DOB: August 17, 1940  Patient Care Team: Danielle Register, MD as PCP - General (Family Medicine) Danielle Dupes, MD as Referring Physician (Family Medicine) Danielle Dominion, NP as Nurse Practitioner (Obstetrics and Gynecology)  Date of visit: 05/21/2023   ASSESSMENT & PLAN:   Danielle Houston is a 83 y.o. asian lady with a past medical history of hypertension, hypothyroidism, CKD, osteoporosis, CAD, dyslipidemia, gout, was referred to our service in December 2024 for evaluation of anemia.  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  Anemia Chronic anemia since 2023, with hemoglobin consistently between 7 and 8. No obvious blood loss reported. Patient has chronic kidney disease which could contribute to anemia. Iron labs and hemoglobin electrophoresis from primary care physician were normal.  On her initial consultation with [place] on 03/12/2023, labs revealed hemoglobin of 8.3, hematocrit 25.5, MCV 97.7.  White count, platelet count were normal.  Creatinine 2.28, BUN 48.  Otherwise unremarkable CMP.  Vitamin B12, folate, methylmalonic acid, haptoglobin, LDH were all within normal limits. Coombs test negative.  SPEP showed no evidence of M spike.  IFE showed polyclonal increase in 1 or more immunoglobulins.  Quantitative IgG was slightly increased at 1966 mg/dL, IgA and IgM were within normal limits.  Serum free kappa was 96.2 mg/L, free lambda was 7.4 mg/L, both elevated with normal ratio of 1.35, consistent with CKD picture.  Labs today showed stable hemoglobin of 8.7.  White count and platelet count are within normal limits.  Creatinine 2.6, otherwise unremarkable CMP.  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  If hemoglobin remains consistently below 9, we could consider monthly EPO simulating agents like Retacrit or  Aranesp in an attempt to improve her hemoglobin.  -I will plan to evaluate her in 3 months with repeat labs and determine further course of action.  Vertigo Chronic dizziness, recently worsening. Symptoms exacerbated by lowering head forward, associated with tinnitus, suggesting possible vertigo. No prior treatment with antivertigo medications. Discussed use of meclizine for symptom relief. - Prescribe meclizine - Send prescription to [facility]  Uterine prolapse History of uterine prolapse. Missed last OB appointment in April 2024 due to hospitalization. - Contact OB regarding missed appointment and recent symptoms - Schedule follow-up with OB   I spent a total of 25 minutes during this encounter with the patient including review of chart and various tests results, discussions about plan of care and coordination of care plan.  I reviewed lab results and outside records for this visit and discussed relevant results with the patient. Diagnosis, plan of care and treatment options were also discussed in detail with the patient. Opportunity provided to ask questions and answers provided to her apparent satisfaction. Provided instructions to call our clinic with any problems, questions or concerns prior to return visit. I recommended to continue follow-up with PCP and sub-specialists. She verbalized understanding and agreed with the plan. No barriers to learning was detected.  Danielle Crutch, MD  05/21/2023 2:31 PM  [facility] - [name] 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   She speaks a  particular dialect of Mandarin and we had to use the help of the interpreter and also her son to communicate with her today.   Patient returns for follow-up.  She reports long-standing issue of dizziness that has recently worsened. The dizziness is particularly noticeable when she lowers her head. She also reports tinnitus, particularly when tilting her head. The patient also has a history of uterine prolapse, which has not been followed up on recently.   SUMMARY OF HEMATOLOGIC HISTORY:  On 02/25/2023, labs showed hemoglobin of 7.8.  Creatinine 2.1, BUN 45, otherwise unremarkable CMP.  Iron studies showed no evidence of iron deficiency.  Folate was normal at 24.  B12 normal at 430.  Previously labs on 01/26/2023 showed hemoglobin of 7.6, hematocrit 24.1, MCV 101.3.  White count and platelet count were within normal limits.  Hemoglobin electrophoresis showed normal phenotype.  On review of records, hemoglobin has been in the range of 7-8 from September 2024.  Given persistent anemia, referral was sent to [place] for further evaluation.   She denies recent chest pain on exertion, shortness of breath on minimal exertion, pre-syncopal episodes, or palpitations. The patient denies any obvious blood loss, but reports experiencing dry mouth and finding blood in her mouth in the mornings.  Chronic anemia since 2023, with hemoglobin consistently between 7 and 8. No obvious blood loss reported. Patient has chronic kidney disease which could contribute to anemia. Iron labs and hemoglobin electrophoresis from primary care physician were normal.   On her initial consultation with [place] on 03/12/2023, labs revealed hemoglobin of 8.3, hematocrit 25.5, MCV 97.7.  White count, platelet count were normal.  Creatinine 2.28, BUN 48.  Otherwise unremarkable CMP.  Vitamin B12, folate, methylmalonic acid, haptoglobin, LDH were all within normal limits. Coombs test negative.  SPEP showed no evidence of M spike.  IFE showed polyclonal  increase in 1 or more immunoglobulins.  Quantitative IgG was slightly increased at 1966 mg/dL, IgA and IgM were within normal limits.  Serum free kappa was 96.2 mg/L, free lambda was 7.4 mg/L, both elevated with normal ratio of 1.35, consistent with CKD picture.  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  If hemoglobin remains consistently below 9, we could consider monthly EPO simulating agents like Retacrit or Aranesp in an attempt to improve her hemoglobin.  I have reviewed the past medical history, past surgical history, social history and family history with the patient and they are unchanged from previous note.  ALLERGIES: She has no known allergies.  MEDICATIONS:  Current Outpatient Medications  Medication Sig Dispense Refill   meclizine (ANTIVERT) 12.5 MG tablet Take 1 tablet (12.5 mg total) by mouth 3 (three) times daily as needed for dizziness. 30 tablet 0   alendronate (FOSAMAX) 70 MG tablet Take 1 tablet (70 mg total) by mouth every 7 (seven) days. Take with a full glass of water on an empty stomach. 12 tablet 1   allopurinol (ZYLOPRIM) 100 MG tablet Take 0.5 tablets (50 mg total) by mouth every other day. 15 tablet 6   aspirin EC 81 MG tablet Take 1 tablet (81 mg total) by mouth daily. Swallow whole. 30 tablet 11   atorvastatin (LIPITOR) 40 MG tablet TAKE 1 TABLET (40 MG TOTAL) BY MOUTH DAILY. 90 tablet 1   carvedilol (COREG) 12.5 MG tablet TAKE 1 TABLET (12.5 MG TOTAL) BY  MOUTH 2 (TWO) TIMES DAILY WITH A MEAL. 180 tablet 1   diclofenac Sodium (VOLTAREN) 1 % GEL APPLY 2 Grams TOPICALLY 4 (FOUR) TIMES DAILY as needed for PAIN 100 g 1   hydrALAZINE (APRESOLINE) 100 MG tablet Take 1 tablet (100 mg total) by mouth 2 (two) times daily. 90 tablet 6   isosorbide mononitrate (IMDUR) 120 MG 24 hr tablet Take 1 tablet (120 mg total) by mouth daily. 90 tablet 1   lidocaine (LIDODERM) 5 % Place 1 patch onto the skin daily. Remove & Discard  patch within 12 hours or as directed by MD 30 patch 1   pantoprazole (PROTONIX) 40 MG tablet Take 1 tablet (40 mg total) by mouth 2 (two) times daily before a meal. 60 tablet 0   Vitamin D, Ergocalciferol, (DRISDOL) 1.25 MG (50000 UNIT) CAPS capsule Take 1 capsule (50,000 Units total) by mouth every 7 (seven) days. 5 capsule 0   No current facility-administered medications for this visit.     REVIEW OF SYSTEMS:    Review of Systems  Gastrointestinal:  Positive for abdominal pain and constipation.  Neurological:  Positive for dizziness and tingling.    All other pertinent systems were reviewed with the patient and are negative.  PHYSICAL EXAMINATION:   Onc Performance Status - 05/21/23 1004       ECOG Perf Status   ECOG Perf Status Ambulatory and capable of all selfcare but unable to carry out any work activities.  Up and about more than 50% of waking hours      KPS SCALE   KPS % SCORE Requires considerable assistance, and frequent medical care             Vitals:   05/21/23 0951  BP: (!) 201/80  Pulse: 73  Resp: 16  Temp: (!) 97.3 F (36.3 C)  SpO2: 98%   Filed Weights   05/21/23 0951  Weight: 123 lb 11.2 oz (56.1 kg)    Physical Exam Constitutional:      General: She is not in acute distress.    Appearance: Normal appearance.  HENT:     Head: Normocephalic and atraumatic.  Eyes:     General: No scleral icterus.    Conjunctiva/sclera: Conjunctivae normal.  Cardiovascular:     Rate and Rhythm: Normal rate and regular rhythm.     Heart sounds: Normal heart sounds.  Pulmonary:     Effort: Pulmonary effort is normal.     Breath sounds: Normal breath sounds.  Abdominal:     General: There is no distension.  Musculoskeletal:     Right lower leg: No edema.     Left lower leg: No edema.  Neurological:     General: No focal deficit present.     Mental Status: She is alert.    LABORATORY DATA:   I have reviewed the data as listed.  Results for orders  placed or performed in visit on 05/21/23  Iron and Iron Binding Capacity ([facility] only)  Result Value Ref Range   Iron 95 28 - 170 ug/dL   TIBC 098 (L) 119 - 147 ug/dL   Saturation Ratios 38 (H) 10.4 - 31.8 %   UIBC 153 148 - 442 ug/dL  Ferritin  Result Value Ref Range   Ferritin 307 11 - 307 ng/mL  Comprehensive metabolic panel  Result Value Ref Range   Sodium 140 135 - 145 mmol/L   Potassium 5.0 3.5 - 5.1 mmol/L   Chloride 109 98 - 111 mmol/L  CO2 25 22 - 32 mmol/L   Glucose, Bld 128 (H) 70 - 99 mg/dL   BUN 59 (H) 8 - 23 mg/dL   Creatinine, Ser 5.78 (H) 0.44 - 1.00 mg/dL   Calcium 9.3 8.9 - 46.9 mg/dL   Total Protein 7.8 6.5 - 8.1 g/dL   Albumin 4.4 3.5 - 5.0 g/dL   AST 18 15 - 41 U/L   ALT 11 0 - 44 U/L   Alkaline Phosphatase 82 38 - 126 U/L   Total Bilirubin 0.3 0.0 - 1.2 mg/dL   GFR, Estimated 18 (L) >60 mL/min   Anion gap 6 5 - 15  CBC with Differential/Platelet  Result Value Ref Range   WBC 6.2 4.0 - 10.5 K/uL   RBC 2.71 (L) 3.87 - 5.11 MIL/uL   Hemoglobin 8.7 (L) 12.0 - 15.0 g/dL   HCT 62.9 (L) 52.8 - 41.3 %   MCV 97.4 80.0 - 100.0 fL   MCH 32.1 26.0 - 34.0 pg   MCHC 33.0 30.0 - 36.0 g/dL   RDW 24.4 01.0 - 27.2 %   Platelets 174 150 - 400 K/uL   nRBC 0.0 0.0 - 0.2 %   Neutrophils Relative % 64 %   Neutro Abs 3.9 1.7 - 7.7 K/uL   Lymphocytes Relative 25 %   Lymphs Abs 1.6 0.7 - 4.0 K/uL   Monocytes Relative 7 %   Monocytes Absolute 0.5 0.1 - 1.0 K/uL   Eosinophils Relative 3 %   Eosinophils Absolute 0.2 0.0 - 0.5 K/uL   Basophils Relative 1 %   Basophils Absolute 0.0 0.0 - 0.1 K/uL   Immature Granulocytes 0 %   Abs Immature Granulocytes 0.02 0.00 - 0.07 K/uL    RADIOGRAPHIC STUDIES:  No recent pertinent imaging studies available to review.  Orders Placed This Encounter  Procedures   CBC with Differential (Cancer Center Only)    Standing Status:   Future    Expected Date:   08/21/2023    Expiration Date:   05/20/2024   CMP (Cancer Center only)     Standing Status:   Future    Expected Date:   08/21/2023    Expiration Date:   05/20/2024   Iron and Iron Binding Capacity ([facility] only)    Standing Status:   Future    Expected Date:   08/21/2023    Expiration Date:   05/20/2024   Ferritin    Standing Status:   Future    Expected Date:   08/21/2023    Expiration Date:   05/20/2024     Future Appointments  Date Time Provider Department Center  08/26/2023 11:00 AM [facility] None  08/26/2023 11:30 AM Emmelia Holdsworth, Archie Patten, MD [facility] None     This document was completed utilizing speech recognition software. Grammatical errors, random word insertions, pronoun errors, and incomplete sentences are an occasional consequence of this system due to software limitations, ambient noise, and hardware issues. Any formal questions or concerns about the content, text or information contained within the body of this dictation should be directly addressed to the provider for clarification.

## 2023-05-22 ENCOUNTER — Encounter: Payer: Self-pay | Admitting: Oncology

## 2023-05-22 ENCOUNTER — Telehealth: Payer: Self-pay | Admitting: Oncology

## 2023-05-22 DIAGNOSIS — R42 Dizziness and giddiness: Secondary | ICD-10-CM | POA: Insufficient documentation

## 2023-05-22 DIAGNOSIS — N814 Uterovaginal prolapse, unspecified: Secondary | ICD-10-CM | POA: Insufficient documentation

## 2023-05-22 NOTE — Telephone Encounter (Signed)
 Patient scheduled appts with interpreter on the line. Patient is aware of all appt details.

## 2023-05-22 NOTE — Assessment & Plan Note (Addendum)
 Chronic anemia since 2023, with hemoglobin consistently between 7 and 8. No obvious blood loss reported. Patient has chronic kidney disease which could contribute to anemia. Iron labs and hemoglobin electrophoresis from primary care physician were normal.  On her initial consultation with [place] on 03/12/2023, labs revealed hemoglobin of 8.3, hematocrit 25.5, MCV 97.7.  White count, platelet count were normal.  Creatinine 2.28, BUN 48.  Otherwise unremarkable CMP.  Vitamin B12, folate, methylmalonic acid, haptoglobin, LDH were all within normal limits. Coombs test negative.  SPEP showed no evidence of M spike.  IFE showed polyclonal increase in 1 or more immunoglobulins.  Quantitative IgG was slightly increased at 1966 mg/dL, IgA and IgM were within normal limits.  Serum free kappa was 96.2 mg/L, free lambda was 7.4 mg/L, both elevated with normal ratio of 1.35, consistent with CKD picture.  Labs today showed stable hemoglobin of 8.7.  White count and platelet count are within normal limits.  Creatinine 2.6, otherwise unremarkable CMP.  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  If hemoglobin remains consistently below 9, we could consider monthly EPO simulating agents like Retacrit or Aranesp in an attempt to improve her hemoglobin.  -I will plan to evaluate her in 3 months with repeat labs and determine further course of action.

## 2023-05-22 NOTE — Assessment & Plan Note (Signed)
 Chronic dizziness, recently worsening. Symptoms exacerbated by lowering head forward, associated with tinnitus, suggesting possible vertigo. No prior treatment with antivertigo medications. Discussed use of meclizine for symptom relief. - Prescribe meclizine - Send prescription to [facility]

## 2023-05-22 NOTE — Assessment & Plan Note (Signed)
 History of uterine prolapse. Missed last OB appointment in April 2024 due to hospitalization. - Contact OB regarding missed appointment and recent symptoms - Schedule follow-up with OB

## 2023-06-02 ENCOUNTER — Other Ambulatory Visit: Payer: Self-pay

## 2023-08-26 ENCOUNTER — Inpatient Hospital Stay

## 2023-08-26 ENCOUNTER — Inpatient Hospital Stay: Attending: Oncology | Admitting: Oncology

## 2023-08-26 ENCOUNTER — Encounter: Payer: Self-pay | Admitting: Oncology

## 2023-08-26 VITALS — BP 190/74 | HR 73 | Temp 98.3°F | Resp 18 | Ht 62.0 in | Wt 128.7 lb

## 2023-08-26 DIAGNOSIS — D649 Anemia, unspecified: Secondary | ICD-10-CM | POA: Insufficient documentation

## 2023-08-26 DIAGNOSIS — R768 Other specified abnormal immunological findings in serum: Secondary | ICD-10-CM | POA: Diagnosis not present

## 2023-08-26 DIAGNOSIS — K921 Melena: Secondary | ICD-10-CM | POA: Diagnosis not present

## 2023-08-26 DIAGNOSIS — D631 Anemia in chronic kidney disease: Secondary | ICD-10-CM

## 2023-08-26 DIAGNOSIS — N1832 Chronic kidney disease, stage 3b: Secondary | ICD-10-CM

## 2023-08-26 DIAGNOSIS — I129 Hypertensive chronic kidney disease with stage 1 through stage 4 chronic kidney disease, or unspecified chronic kidney disease: Secondary | ICD-10-CM | POA: Insufficient documentation

## 2023-08-26 DIAGNOSIS — N189 Chronic kidney disease, unspecified: Secondary | ICD-10-CM | POA: Diagnosis not present

## 2023-08-26 DIAGNOSIS — I10 Essential (primary) hypertension: Secondary | ICD-10-CM | POA: Diagnosis not present

## 2023-08-26 LAB — CMP (CANCER CENTER ONLY)
ALT: 12 U/L (ref 0–44)
AST: 19 U/L (ref 15–41)
Albumin: 4.4 g/dL (ref 3.5–5.0)
Alkaline Phosphatase: 65 U/L (ref 38–126)
Anion gap: 6 (ref 5–15)
BUN: 41 mg/dL — ABNORMAL HIGH (ref 8–23)
CO2: 23 mmol/L (ref 22–32)
Calcium: 9.2 mg/dL (ref 8.9–10.3)
Chloride: 107 mmol/L (ref 98–111)
Creatinine: 2.48 mg/dL — ABNORMAL HIGH (ref 0.44–1.00)
GFR, Estimated: 19 mL/min — ABNORMAL LOW (ref >60)
Glucose, Bld: 113 mg/dL — ABNORMAL HIGH (ref 70–99)
Potassium: 4.9 mmol/L (ref 3.5–5.1)
Sodium: 136 mmol/L (ref 135–145)
Total Bilirubin: 0.4 mg/dL (ref 0.0–1.2)
Total Protein: 7.8 g/dL (ref 6.5–8.1)

## 2023-08-26 LAB — CBC WITH DIFFERENTIAL (CANCER CENTER ONLY)
Abs Immature Granulocytes: 0.06 10*3/uL (ref 0.00–0.07)
Basophils Absolute: 0 10*3/uL (ref 0.0–0.1)
Basophils Relative: 1 %
Eosinophils Absolute: 0.2 10*3/uL (ref 0.0–0.5)
Eosinophils Relative: 3 %
HCT: 24.7 % — ABNORMAL LOW (ref 36.0–46.0)
Hemoglobin: 8.3 g/dL — ABNORMAL LOW (ref 12.0–15.0)
Immature Granulocytes: 1 %
Lymphocytes Relative: 23 %
Lymphs Abs: 1.6 10*3/uL (ref 0.7–4.0)
MCH: 32.2 pg (ref 26.0–34.0)
MCHC: 33.6 g/dL (ref 30.0–36.0)
MCV: 95.7 fL (ref 80.0–100.0)
Monocytes Absolute: 0.5 10*3/uL (ref 0.1–1.0)
Monocytes Relative: 7 %
Neutro Abs: 4.5 10*3/uL (ref 1.7–7.7)
Neutrophils Relative %: 65 %
Platelet Count: 194 10*3/uL (ref 150–400)
RBC: 2.58 MIL/uL — ABNORMAL LOW (ref 3.87–5.11)
RDW: 13 % (ref 11.5–15.5)
WBC Count: 6.9 10*3/uL (ref 4.0–10.5)
nRBC: 0 % (ref 0.0–0.2)

## 2023-08-26 LAB — IRON AND IRON BINDING CAPACITY (CC-WL,HP ONLY)
Iron: 91 ug/dL (ref 28–170)
Saturation Ratios: 40 % — ABNORMAL HIGH (ref 10.4–31.8)
TIBC: 230 ug/dL — ABNORMAL LOW (ref 250–450)
UIBC: 139 ug/dL — ABNORMAL LOW (ref 148–442)

## 2023-08-26 LAB — FERRITIN: Ferritin: 576 ng/mL — ABNORMAL HIGH (ref 11–307)

## 2023-08-26 NOTE — Assessment & Plan Note (Addendum)
 Chronic anemia since 2023, with hemoglobin consistently between 7 and 8. No obvious blood loss reported. Patient has chronic kidney disease which could contribute to anemia. Iron labs and hemoglobin electrophoresis from primary care physician were normal.  On her initial consultation with us  on 03/12/2023, labs revealed hemoglobin of 8.3, hematocrit 25.5, MCV 97.7.  White count, platelet count were normal.  Creatinine 2.28, BUN 48.  Otherwise unremarkable CMP.  Vitamin B12, folate, methylmalonic acid, haptoglobin, LDH were all within normal limits. Coombs test negative.  SPEP showed no evidence of M spike.  IFE showed polyclonal increase in 1 or more immunoglobulins.  Quantitative IgG was slightly increased at 1966 mg/dL, IgA and IgM were within normal limits.  Serum free kappa was 96.2 mg/L, free lambda was 7.4 mg/L, both elevated with normal ratio of 1.35, consistent with CKD picture.  Labs today showed stable hemoglobin of 8.3.  White count and platelet count are within normal limits.  Creatinine 2.48, otherwise unremarkable CMP.  Dark stools likely due to iron supplementation. - Continue current management.  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  If hemoglobin remains consistently below 9, we could consider monthly EPO simulating agents like Retacrit or Aranesp in an attempt to improve her hemoglobin.  -I will plan to evaluate her in 6 months with repeat labs and determine further course of action.

## 2023-08-26 NOTE — Assessment & Plan Note (Signed)
 Hypertension with consistently elevated readings. She is on hydralazine , carvedilol , and isosorbide . Medication adherence at her facility is uncertain. - Verify medication adherence with the facility. - Communicate with the facility to adjust medications if hypertension persists.

## 2023-08-26 NOTE — Progress Notes (Signed)
 [facility]  HEMATOLOGY CLINIC PROGRESS NOTE  PATIENT NAME: Danielle Houston   MR#: [id number] DOB: 11-23-1940  Patient Care Team: Joaquin Mulberry, MD as PCP - General (Family Medicine) Hoover Luz, MD as Referring Physician (Family Medicine) Zuleta, Kaitlin G, NP as Nurse Practitioner (Obstetrics and Gynecology)  Date of visit: 08/26/2023   ASSESSMENT & PLAN:   Danielle Houston is a 83 y.o. asian lady with a past medical history of hypertension, hypothyroidism, CKD, osteoporosis, CAD, dyslipidemia, gout, was referred to our service in December 2024 for evaluation of anemia.  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  Anemia in chronic kidney disease (CKD) Chronic anemia since 2023, with hemoglobin consistently between 7 and 8. No obvious blood loss reported. Patient has chronic kidney disease which could contribute to anemia. Iron labs and hemoglobin electrophoresis from primary care physician were normal.  On her initial consultation with us  on 03/12/2023, labs revealed hemoglobin of 8.3, hematocrit 25.5, MCV 97.7.  White count, platelet count were normal.  Creatinine 2.28, BUN 48.  Otherwise unremarkable CMP.  Vitamin B12, folate, methylmalonic acid, haptoglobin, LDH were all within normal limits. Coombs test negative.  SPEP showed no evidence of M spike.  IFE showed polyclonal increase in 1 or more immunoglobulins.  Quantitative IgG was slightly increased at 1966 mg/dL, IgA and IgM were within normal limits.  Serum free kappa was 96.2 mg/L, free lambda was 7.4 mg/L, both elevated with normal ratio of 1.35, consistent with CKD picture.  Labs today showed stable hemoglobin of 8.3.  White count and platelet count are within normal limits.  Creatinine 2.48, otherwise unremarkable CMP.  Dark stools likely due to iron supplementation. - Continue current management.  Most likely explanation for her normocytic anemia is anemia of chronic disease, related to  CKD.  If hemoglobin remains consistently below 9, we could consider monthly EPO simulating agents like Retacrit or Aranesp in an attempt to improve her hemoglobin.  -I will plan to evaluate her in 6 months with repeat labs and determine further course of action.  Essential hypertension Hypertension with consistently elevated readings. She is on hydralazine , carvedilol , and isosorbide . Medication adherence at her facility is uncertain. - Verify medication adherence with the facility. - Communicate with the facility to adjust medications if hypertension persists.   I spent a total of 25 minutes during this encounter with the patient including review of chart and various tests results, discussions about plan of care and coordination of care plan.  I reviewed lab results and outside records for this visit and discussed relevant results with the patient. Diagnosis, plan of care and treatment options were also discussed in detail with the patient. Opportunity provided to ask questions and answers provided to her apparent satisfaction. Provided instructions to call our clinic with any problems, questions or concerns prior to return visit. I recommended to continue follow-up with PCP and sub-specialists. She verbalized understanding and agreed with the plan. No barriers to learning was detected.  Arlo Berber, MD  08/26/2023 3:14 PM  [facility] - A DEPT OF [facility] [address] [place] [address] Dept: [phone] Dept Fax: [phone]   CHIEF COMPLAINT/ REASON FOR VISIT:  Chronic normocytic anemia, likely anemia of chronic disease, related to CKD  INTERVAL HISTORY:  Discussed the use of AI scribe software for clinical note transcription with the patient, who gave verbal consent to proceed.   She  speaks a particular dialect of Mandarin and we had to use the help of the interpreter and also her family members to  communicate with her today.   History of Present Illness Danielle Houston is an 83 year old female with chronic kidney disease who presents for follow-up of anemia related to her kidney disease. She is accompanied by family members.  She has been experiencing a stomach ache for an unspecified duration, along with changes in bowel movements, possibly diarrhea, occurring two to three times a day. No blood in stools, but she notes dark stools, potentially related to iron tablet intake.  Her hemoglobin level is stable at 8.3 g/dL. She experiences nocturia, waking up at night to urinate.  She is on multiple medications for blood pressure management, including hydralazine , carvedilol , and isosorbide . There is uncertainty about her adherence to these medications, and her blood pressure has been consistently high during visits.  She resides in a nursing facility.    SUMMARY OF HEMATOLOGIC HISTORY:  On 02/25/2023, labs showed hemoglobin of 7.8.  Creatinine 2.1, BUN 45, otherwise unremarkable CMP.  Iron studies showed no evidence of iron deficiency.  Folate was normal at 24.  B12 normal at 430.  Previously labs on 01/26/2023 showed hemoglobin of 7.6, hematocrit 24.1, MCV 101.3.  White count and platelet count were within normal limits.  Hemoglobin electrophoresis showed normal phenotype.  On review of records, hemoglobin has been in the range of 7-8 from September 2024.  Given persistent anemia, referral was sent to us  for further evaluation.   She denies recent chest pain on exertion, shortness of breath on minimal exertion, pre-syncopal episodes, or palpitations. The patient denies any obvious blood loss, but reports experiencing dry mouth and finding blood in her mouth in the mornings.  Chronic anemia since 2023, with hemoglobin consistently between 7 and 8. No obvious blood loss reported. Patient has chronic kidney disease which could contribute to anemia. Iron labs and hemoglobin electrophoresis from  primary care physician were normal.   On her initial consultation with us  on 03/12/2023, labs revealed hemoglobin of 8.3, hematocrit 25.5, MCV 97.7.  White count, platelet count were normal.  Creatinine 2.28, BUN 48.  Otherwise unremarkable CMP.  Vitamin B12, folate, methylmalonic acid, haptoglobin, LDH were all within normal limits. Coombs test negative.  SPEP showed no evidence of M spike.  IFE showed polyclonal increase in 1 or more immunoglobulins.  Quantitative IgG was slightly increased at 1966 mg/dL, IgA and IgM were within normal limits.  Serum free kappa was 96.2 mg/L, free lambda was 7.4 mg/L, both elevated with normal ratio of 1.35, consistent with CKD picture.  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  If hemoglobin remains consistently below 9, we could consider monthly EPO simulating agents like Retacrit or Aranesp in an attempt to improve her hemoglobin.  I have reviewed the past medical history, past surgical history, social history and family history with the patient and they are unchanged from previous note.  ALLERGIES: She has no known allergies.  MEDICATIONS:  Current Outpatient Medications  Medication Sig Dispense Refill   allopurinol  (ZYLOPRIM ) 100 MG tablet Take 0.5 tablets (50 mg total) by mouth every other day. 15 tablet 6   aspirin  EC 81 MG tablet Take 1 tablet (81 mg total) by mouth daily. Swallow whole. 30 tablet 11   atorvastatin  (LIPITOR) 40 MG tablet TAKE 1 TABLET (40 MG TOTAL) BY MOUTH DAILY. 90 tablet 1   carvedilol  (COREG )  12.5 MG tablet TAKE 1 TABLET (12.5 MG TOTAL) BY MOUTH 2 (TWO) TIMES DAILY WITH A MEAL. 180 tablet 1   guaiFENesin-dextromethorphan (ROBITUSSIN DM) 100-10 MG/5ML syrup Take 10 mLs by mouth every 6 (six) hours as needed for cough.     hydrALAZINE  (APRESOLINE ) 100 MG tablet Take 1 tablet (100 mg total) by mouth 2 (two) times daily. 90 tablet 6   isosorbide  mononitrate (IMDUR ) 120 MG 24 hr  tablet Take 1 tablet (120 mg total) by mouth daily. 90 tablet 1   pantoprazole  (PROTONIX ) 40 MG tablet Take 1 tablet (40 mg total) by mouth 2 (two) times daily before a meal. 60 tablet 0   Vitamin D , Ergocalciferol , (DRISDOL ) 1.25 MG (50000 UNIT) CAPS capsule Take 1 capsule (50,000 Units total) by mouth every 7 (seven) days. 5 capsule 0   No current facility-administered medications for this visit.     REVIEW OF SYSTEMS:    Review of Systems  Gastrointestinal:  Positive for abdominal pain and constipation.  Neurological:  Positive for dizziness and tingling.    All other pertinent systems were reviewed with the patient and are negative.  PHYSICAL EXAMINATION:   Onc Performance Status - 08/26/23 1230       ECOG Perf Status   ECOG Perf Status Ambulatory and capable of all selfcare but unable to carry out any work activities.  Up and about more than 50% of waking hours      KPS SCALE   KPS % SCORE Requires considerable assistance, and frequent medical care              Vitals:   08/26/23 1204 08/26/23 1207  BP: (!) 182/83 (!) 190/74  Pulse: 73   Resp: 18   Temp: 98.3 F (36.8 C)   SpO2: 97%    Filed Weights   08/26/23 1204  Weight: 128 lb 11.2 oz (58.4 kg)    Physical Exam Constitutional:      General: She is not in acute distress.    Appearance: Normal appearance.  HENT:     Head: Normocephalic and atraumatic.  Eyes:     General: No scleral icterus.    Conjunctiva/sclera: Conjunctivae normal.  Cardiovascular:     Rate and Rhythm: Normal rate and regular rhythm.     Heart sounds: Normal heart sounds.  Pulmonary:     Effort: Pulmonary effort is normal.     Breath sounds: Normal breath sounds.  Abdominal:     General: There is no distension.  Musculoskeletal:     Right lower leg: No edema.     Left lower leg: No edema.  Neurological:     General: No focal deficit present.     Mental Status: She is alert.    LABORATORY DATA:   I have reviewed the  data as listed.  Results for orders placed or performed in visit on 08/26/23  Iron and Iron Binding Capacity ([facility] only)  Result Value Ref Range   Iron 91 28 - 170 ug/dL   TIBC 161 (L) 096 - 045 ug/dL   Saturation Ratios 40 (H) 10.4 - 31.8 %   UIBC 139 (L) 148 - 442 ug/dL  CMP (Cancer Center only)  Result Value Ref Range   Sodium 136 135 - 145 mmol/L   Potassium 4.9 3.5 - 5.1 mmol/L   Chloride 107 98 - 111 mmol/L   CO2 23 22 - 32 mmol/L   Glucose, Bld 113 (H) 70 - 99 mg/dL   BUN 41 (H)  8 - 23 mg/dL   Creatinine 9.56 (H) 2.13 - 1.00 mg/dL   Calcium  9.2 8.9 - 10.3 mg/dL   Total Protein 7.8 6.5 - 8.1 g/dL   Albumin 4.4 3.5 - 5.0 g/dL   AST 19 15 - 41 U/L   ALT 12 0 - 44 U/L   Alkaline Phosphatase 65 38 - 126 U/L   Total Bilirubin 0.4 0.0 - 1.2 mg/dL   GFR, Estimated 19 (L) >60 mL/min   Anion gap 6 5 - 15  CBC with Differential (Cancer Center Only)  Result Value Ref Range   WBC Count 6.9 4.0 - 10.5 K/uL   RBC 2.58 (L) 3.87 - 5.11 MIL/uL   Hemoglobin 8.3 (L) 12.0 - 15.0 g/dL   HCT 08.6 (L) 57.8 - 46.9 %   MCV 95.7 80.0 - 100.0 fL   MCH 32.2 26.0 - 34.0 pg   MCHC 33.6 30.0 - 36.0 g/dL   RDW 62.9 52.8 - 41.3 %   Platelet Count 194 150 - 400 K/uL   nRBC 0.0 0.0 - 0.2 %   Neutrophils Relative % 65 %   Neutro Abs 4.5 1.7 - 7.7 K/uL   Lymphocytes Relative 23 %   Lymphs Abs 1.6 0.7 - 4.0 K/uL   Monocytes Relative 7 %   Monocytes Absolute 0.5 0.1 - 1.0 K/uL   Eosinophils Relative 3 %   Eosinophils Absolute 0.2 0.0 - 0.5 K/uL   Basophils Relative 1 %   Basophils Absolute 0.0 0.0 - 0.1 K/uL   Immature Granulocytes 1 %   Abs Immature Granulocytes 0.06 0.00 - 0.07 K/uL    RADIOGRAPHIC STUDIES:  No recent pertinent imaging studies available to review.  Orders Placed This Encounter  Procedures   CBC with Differential (Cancer Center Only)    Standing Status:   Future    Expected Date:   02/25/2024    Expiration Date:   08/25/2024   CMP (Cancer Center only)    Standing  Status:   Future    Expected Date:   02/25/2024    Expiration Date:   08/25/2024   Iron and Iron Binding Capacity ([facility] only)    Standing Status:   Future    Expected Date:   02/25/2024    Expiration Date:   08/25/2024   Ferritin    Standing Status:   Future    Expected Date:   02/25/2024    Expiration Date:   08/25/2024     Future Appointments  Date Time Provider Department Center  02/24/2024 11:00 AM [facility] None  02/24/2024 11:30 AM Alice Burnside, Gale Jude, MD [facility] None      This document was completed utilizing speech recognition software. Grammatical errors, random word insertions, pronoun errors, and incomplete sentences are an occasional consequence of this system due to software limitations, ambient noise, and hardware issues. Any formal questions or concerns about the content, text or information contained within the body of this dictation should be directly addressed to the provider for clarification.

## 2023-12-19 ENCOUNTER — Emergency Department (HOSPITAL_COMMUNITY)
Admission: EM | Admit: 2023-12-19 | Discharge: 2023-12-20 | Disposition: A | Discharge status: Skilled Nursing Facility | Attending: Emergency Medicine | Admitting: Emergency Medicine

## 2023-12-19 ENCOUNTER — Other Ambulatory Visit: Payer: Self-pay

## 2023-12-19 ENCOUNTER — Encounter (HOSPITAL_COMMUNITY): Payer: Self-pay

## 2023-12-19 ENCOUNTER — Emergency Department (HOSPITAL_COMMUNITY)

## 2023-12-19 DIAGNOSIS — I2510 Atherosclerotic heart disease of native coronary artery without angina pectoris: Secondary | ICD-10-CM | POA: Insufficient documentation

## 2023-12-19 DIAGNOSIS — Z79899 Other long term (current) drug therapy: Secondary | ICD-10-CM | POA: Insufficient documentation

## 2023-12-19 DIAGNOSIS — E875 Hyperkalemia: Secondary | ICD-10-CM | POA: Insufficient documentation

## 2023-12-19 DIAGNOSIS — I10 Essential (primary) hypertension: Secondary | ICD-10-CM

## 2023-12-19 DIAGNOSIS — I129 Hypertensive chronic kidney disease with stage 1 through stage 4 chronic kidney disease, or unspecified chronic kidney disease: Secondary | ICD-10-CM | POA: Insufficient documentation

## 2023-12-19 DIAGNOSIS — R0789 Other chest pain: Secondary | ICD-10-CM | POA: Insufficient documentation

## 2023-12-19 DIAGNOSIS — N189 Chronic kidney disease, unspecified: Secondary | ICD-10-CM | POA: Insufficient documentation

## 2023-12-19 DIAGNOSIS — Z7982 Long term (current) use of aspirin: Secondary | ICD-10-CM | POA: Diagnosis not present

## 2023-12-19 DIAGNOSIS — R059 Cough, unspecified: Secondary | ICD-10-CM | POA: Insufficient documentation

## 2023-12-19 DIAGNOSIS — E039 Hypothyroidism, unspecified: Secondary | ICD-10-CM | POA: Diagnosis not present

## 2023-12-19 DIAGNOSIS — R079 Chest pain, unspecified: Secondary | ICD-10-CM | POA: Diagnosis present

## 2023-12-19 LAB — CBC WITH DIFFERENTIAL/PLATELET
Abs Immature Granulocytes: 0.11 K/uL — ABNORMAL HIGH (ref 0.00–0.07)
Basophils Absolute: 0 K/uL (ref 0.0–0.1)
Basophils Relative: 0 %
Eosinophils Absolute: 0.3 K/uL (ref 0.0–0.5)
Eosinophils Relative: 4 %
HCT: 29.2 % — ABNORMAL LOW (ref 36.0–46.0)
Hemoglobin: 9.2 g/dL — ABNORMAL LOW (ref 12.0–15.0)
Immature Granulocytes: 1 %
Lymphocytes Relative: 20 %
Lymphs Abs: 1.6 K/uL (ref 0.7–4.0)
MCH: 31.5 pg (ref 26.0–34.0)
MCHC: 31.5 g/dL (ref 30.0–36.0)
MCV: 100 fL (ref 80.0–100.0)
Monocytes Absolute: 0.5 K/uL (ref 0.1–1.0)
Monocytes Relative: 6 %
Neutro Abs: 5.6 K/uL (ref 1.7–7.7)
Neutrophils Relative %: 69 %
Platelets: 198 K/uL (ref 150–400)
RBC: 2.92 MIL/uL — ABNORMAL LOW (ref 3.87–5.11)
RDW: 12.9 % (ref 11.5–15.5)
WBC: 8.3 K/uL (ref 4.0–10.5)
nRBC: 0 % (ref 0.0–0.2)

## 2023-12-19 LAB — COMPREHENSIVE METABOLIC PANEL WITH GFR
ALT: 12 U/L (ref 0–44)
AST: 23 U/L (ref 15–41)
Albumin: 3.9 g/dL (ref 3.5–5.0)
Alkaline Phosphatase: 77 U/L (ref 38–126)
Anion gap: 10 (ref 5–15)
BUN: 41 mg/dL — ABNORMAL HIGH (ref 8–23)
CO2: 20 mmol/L — ABNORMAL LOW (ref 22–32)
Calcium: 9.5 mg/dL (ref 8.9–10.3)
Chloride: 108 mmol/L (ref 98–111)
Creatinine, Ser: 2.72 mg/dL — ABNORMAL HIGH (ref 0.44–1.00)
GFR, Estimated: 17 mL/min — ABNORMAL LOW (ref >60)
Glucose, Bld: 109 mg/dL — ABNORMAL HIGH (ref 70–99)
Potassium: 5.2 mmol/L — ABNORMAL HIGH (ref 3.5–5.1)
Sodium: 138 mmol/L (ref 135–145)
Total Bilirubin: 0.7 mg/dL (ref 0.0–1.2)
Total Protein: 8 g/dL (ref 6.5–8.1)

## 2023-12-19 LAB — RESP PANEL BY RT-PCR (RSV, FLU A&B, COVID)  RVPGX2
Influenza A by PCR: NEGATIVE
Influenza B by PCR: NEGATIVE
Resp Syncytial Virus by PCR: NEGATIVE
SARS Coronavirus 2 by RT PCR: NEGATIVE

## 2023-12-19 LAB — TROPONIN I (HIGH SENSITIVITY)
Troponin I (High Sensitivity): 9 ng/L (ref <18)
Troponin I (High Sensitivity): 11 ng/L (ref <18)

## 2023-12-19 LAB — BRAIN NATRIURETIC PEPTIDE: B Natriuretic Peptide: 116.8 pg/mL — ABNORMAL HIGH (ref 0.0–100.0)

## 2023-12-19 MED ORDER — SODIUM ZIRCONIUM CYCLOSILICATE 10 G PO PACK
10.0000 g | PACK | Freq: Once | ORAL | Status: AC
  Administered 2023-12-19: 10 g via ORAL
  Filled 2023-12-19: qty 1

## 2023-12-19 MED ORDER — MORPHINE SULFATE (PF) 4 MG/ML IV SOLN
4.0000 mg | Freq: Once | INTRAVENOUS | Status: AC
  Administered 2023-12-19: 4 mg via INTRAVENOUS
  Filled 2023-12-19: qty 1

## 2023-12-19 MED ORDER — ONDANSETRON HCL 4 MG/2ML IJ SOLN
4.0000 mg | Freq: Once | INTRAMUSCULAR | Status: AC
  Administered 2023-12-19: 4 mg via INTRAVENOUS
  Filled 2023-12-19: qty 2

## 2023-12-19 MED ORDER — LIDOCAINE VISCOUS HCL 2 % MT SOLN
15.0000 mL | Freq: Once | OROMUCOSAL | Status: AC
  Administered 2023-12-19: 15 mL via ORAL
  Filled 2023-12-19: qty 15

## 2023-12-19 MED ORDER — ALUM & MAG HYDROXIDE-SIMETH 200-200-20 MG/5ML PO SUSP
30.0000 mL | Freq: Once | ORAL | Status: AC
  Administered 2023-12-19: 30 mL via ORAL
  Filled 2023-12-19: qty 30

## 2023-12-19 NOTE — ED Provider Notes (Signed)
 [place] [facility] AT [facility] Provider Note   CSN: [id number] Arrival date & time: 12/19/23  8191     Patient presents with: Hypertension   Danielle Houston is a 83 y.o. female.  Patient with past history significant for anemia in CKD, CAD, hypertension, overactive bladder, vertigo here with concerns of hypertension and chest pain.  Reports that she is out of her hypertensive today by staff at [employer] and had a blood pressure cuff on her wrist noting pressure at 212/120.  She was given 3 doses of sublingual nitro which helped bring blood pressure down to 196/110 the patient is endorsing some pain in this area.  She denies any feelings of diaphoresis but does endorse a slight nausea.  She reports that she can feel the area that hurts in her chest that is central in nature.  Denies any recent falls or other injury.  Does not currently follow with cardiology.  She is reportedly on carvedilol  and hydralazine  for blood pressure control.   Hypertension Associated symptoms include chest pain.       Prior to Admission medications   Medication Sig Start Date End Date Taking? Authorizing Provider  allopurinol  (ZYLOPRIM ) 100 MG tablet Take 0.5 tablets (50 mg total) by mouth every other day. 05/22/22   Newlin, Enobong, MD  aspirin  EC 81 MG tablet Take 1 tablet (81 mg total) by mouth daily. Swallow whole. 10/26/19   Newlin, Enobong, MD  atorvastatin  (LIPITOR) 40 MG tablet TAKE 1 TABLET (40 MG TOTAL) BY MOUTH DAILY. 05/22/22   Newlin, Enobong, MD  carvedilol  (COREG ) 12.5 MG tablet TAKE 1 TABLET (12.5 MG TOTAL) BY MOUTH 2 (TWO) TIMES DAILY WITH A MEAL. 05/22/22   Newlin, Corrina, MD  guaiFENesin-dextromethorphan (ROBITUSSIN DM) 100-10 MG/5ML syrup Take 10 mLs by mouth every 6 (six) hours as needed for cough.    [provider]  hydrALAZINE  (APRESOLINE ) 100 MG tablet Take 1 tablet (100 mg total) by mouth 2 (two) times daily. 04/02/22   Newlin, Enobong, MD  isosorbide  mononitrate  (IMDUR ) 120 MG 24 hr tablet Take 1 tablet (120 mg total) by mouth daily. 05/22/22   Newlin, Enobong, MD  pantoprazole  (PROTONIX ) 40 MG tablet Take 1 tablet (40 mg total) by mouth 2 (two) times daily before a meal. 11/11/22   Tobie Yetta HERO, MD  Vitamin D , Ergocalciferol , (DRISDOL ) 1.25 MG (50000 UNIT) CAPS capsule Take 1 capsule (50,000 Units total) by mouth every 7 (seven) days. 11/11/22   Tobie Yetta HERO, MD  gabapentin  (NEURONTIN ) 300 MG capsule Take 1 capsule (300 mg total) by mouth at bedtime. 01/23/20 04/24/20  Newlin, Enobong, MD    Allergies: Patient has no known allergies.    Review of Systems  Cardiovascular:  Positive for chest pain.  All other systems reviewed and are negative.   Updated Vital Signs BP (!) 146/70   Pulse 66   Resp 16   Ht 5' (1.524 m)   Wt 63.5 kg   LMP  (LMP Unknown)   SpO2 96%   BMI 27.34 kg/m   Physical Exam Vitals and nursing note reviewed.  Constitutional:      General: She is not in acute distress.    Appearance: She is well-developed.  HENT:     Head: Normocephalic and atraumatic.  Eyes:     Conjunctiva/sclera: Conjunctivae normal.  Cardiovascular:     Rate and Rhythm: Normal rate and regular rhythm.     Heart sounds: No murmur heard.    Comments:  Chest wall tenderness on palpation of the central sternum with no appreciable bruising, swelling, or erythema noted. Pulmonary:     Effort: Pulmonary effort is normal. No respiratory distress.     Breath sounds: Normal breath sounds. No wheezing or rales.  Abdominal:     Palpations: Abdomen is soft.     Tenderness: There is no abdominal tenderness.  Musculoskeletal:        General: No swelling.     Cervical back: Neck supple.  Skin:    General: Skin is warm and dry.     Capillary Refill: Capillary refill takes less than 2 seconds.  Neurological:     Mental Status: She is alert.  Psychiatric:        Mood and Affect: Mood normal.     (all labs ordered are listed, but only abnormal results  are displayed) Labs Reviewed  CBC WITH DIFFERENTIAL/PLATELET - Abnormal; Notable for the following components:      Result Value   RBC 2.92 (*)    Hemoglobin 9.2 (*)    HCT 29.2 (*)    Abs Immature Granulocytes 0.11 (*)    All other components within normal limits  COMPREHENSIVE METABOLIC PANEL WITH GFR - Abnormal; Notable for the following components:   Potassium 5.2 (*)    CO2 20 (*)    Glucose, Bld 109 (*)    BUN 41 (*)    Creatinine, Ser 2.72 (*)    GFR, Estimated 17 (*)    All other components within normal limits  BRAIN NATRIURETIC PEPTIDE - Abnormal; Notable for the following components:   B Natriuretic Peptide 116.8 (*)    All other components within normal limits  RESP PANEL BY RT-PCR (RSV, FLU A&B, COVID)  RVPGX2  TROPONIN I (HIGH SENSITIVITY)  TROPONIN I (HIGH SENSITIVITY)    EKG: None  Radiology: DG Chest 2 View Result Date: 12/19/2023 CLINICAL DATA:  Hypertension. EXAM: CHEST - 2 VIEW COMPARISON:  November 08, 2022 FINDINGS: The heart size and mediastinal contours are within normal limits. There is marked severity calcification of the aortic arch. Low lung volumes are noted with subsequent crowding of the bronchovascular lung markings. No acute infiltrate, pleural effusion or pneumothorax is identified. The visualized skeletal structures are unremarkable. IMPRESSION: Low lung volumes without acute or active cardiopulmonary disease. Electronically Signed   By: Suzen Dials M.D.   On: 12/19/2023 20:01     Procedures   Medications Ordered in the ED  morphine  (PF) 4 MG/ML injection 4 mg (4 mg Intravenous Given 12/19/23 1918)  ondansetron  (ZOFRAN ) injection 4 mg (4 mg Intravenous Given 12/19/23 1919)  sodium zirconium cyclosilicate  (LOKELMA ) packet 10 g (10 g Oral Given 12/19/23 2146)  alum & mag hydroxide-simeth (MAALOX/MYLANTA) 200-200-20 MG/5ML suspension 30 mL (30 mLs Oral Given 12/19/23 2146)    And  lidocaine  (XYLOCAINE ) 2 % viscous mouth solution 15 mL (15 mLs  Oral Given 12/19/23 2146)                                    Medical Decision Making Amount and/or Complexity of Data Reviewed Labs: ordered. Radiology: ordered.  Risk OTC drugs. Prescription drug management.   This patient presents to the ED for concern of hypertension, chest pain, this involves an extensive number of treatment options, and is a complaint that carries with it a high risk of complications and morbidity.  The differential diagnosis includes ACS, uncontrolled HTN,  PE, dehydration, viral URI, bronchitis   Co morbidities that complicate the patient evaluation  CAD, hypertension with CKD, overactive bladder, vertigo, hypothyroidism   Lab Tests:  I Ordered, and personally interpreted labs.  The pertinent results include: CBC unremarkable at baseline, CMP shows slight elevation in potassium of 5.2 likely secondary to patient's CKD, troponin negative x 2, BNP unremarkable at 116.8, respiratory panel negative   Imaging Studies ordered:  I ordered imaging studies including chest x-ray I independently visualized and interpreted imaging which showed negative for any acute findings I agree with the radiologist interpretation   Consultations Obtained:  I requested consultation with none,  and discussed lab and imaging findings as well as pertinent plan - they recommend: N/A   Problem List / ED Course / Critical interventions / Medication management  Patient presents to the emergency department with concerns of hypertension.  Reportedly was at her facility when they noted that her blood pressure was reading 212/120.  She was given multiple doses of nitroglycerin and EMS, with improvement blood pressure 196/110.  They had a hard time assessing if she was symptomatic from hypertension due to language barrier.  Primary care, she endorses some slight chest tightness but no reported visual disturbance, leg swelling, or shortness of breath.  Denies any nausea, vomiting, or  diaphoresis. Exam is unremarkable.  There is some focal chest wall tenderness towards the sternum.  Unclear if this may possibly be due to infectious etiology such as pneumonia versus viral URI.  Unclear if patient may be had a postviral presentation with prolonged course of coughing prior to the symptoms developing today. Cardiac workup unremarkable with troponin negative x 2.  BNP negative at 116.8.  CBC and CMP unremarkable with exception of mild hyperkalemia at 5.2.  Respiratory panel negative.  Chest x-ray unremarkable. Workup negative and BP has stablized on it's own after pain and GI medications. Suspect likely benign cause of hypertension at this time due to no overt symptoms to suggest end organ dysfunction. Attempted to contact patient's family to inform them of workup but unable to get a hold of them or leave voicemail. Will discharge patient back to her facility. Discharged in stable condition. I ordered medication including morphine , Zofran , Maalox, viscous lidocaine , Lokelma  for pain, nausea, GI cocktail, hyperkalemia Reevaluation of the patient after these medicines showed that the patient improved I have reviewed the patients home medicines and have made adjustments as needed   Social Determinants of Health:  Resident of nursing facility   Test / Admission - Considered:  Considered but stable for outpatient follow-up.  Final diagnoses:  Uncontrolled hypertension  Other chest pain  Hyperkalemia    ED Discharge Orders     None          Cecily Legrand LABOR, PA-C 12/19/23 2352    Armenta Canning, MD 12/28/23 1151

## 2023-12-19 NOTE — Discharge Instructions (Signed)
 Danielle Houston was evaluated today for elevated blood pressure readings and concerns of chest pain. Her labs and imaging were all reassuring and her symptoms improved largely on their own. I am unsure why her blood pressure elevated in such a way today as she seems to have been taking her medications as prescribed. I would recommend that she follow up with her primary care provider for further care and evaluation. Return to the ER for concerns of new or worsening symptoms.

## 2023-12-19 NOTE — ED Notes (Signed)
 [facility] arrived to take pt. Upon arrival, pt blood pressure elevated above 200 systolic. [facility] unable to take pt at this time, RN aware.

## 2023-12-19 NOTE — ED Notes (Signed)
 Patient transported to X-ray

## 2023-12-19 NOTE — ED Triage Notes (Signed)
 Pt bib GC ems from [facility] for hypertension. Staff noticed wrist bp was 212/120 and administered 3 nitro. Ems got 196/110 on arrival. Unable to determin if pt is symptomatic due to language barrier on scene.

## 2024-02-24 ENCOUNTER — Inpatient Hospital Stay: Payer: Self-pay

## 2024-02-24 ENCOUNTER — Inpatient Hospital Stay: Payer: Self-pay | Admitting: Oncology

## 2024-02-24 ENCOUNTER — Telehealth: Payer: Self-pay

## 2024-02-24 NOTE — Telephone Encounter (Signed)
 Spoke to patient's son who said his mother was living at Nursing Home. He gave out the phone number which turned out to be [facility]. Called and LVM regarding patient's missed appointments today with Dr. Autumn in Oncology along with a lab appointment.

## 2024-04-07 ENCOUNTER — Other Ambulatory Visit: Payer: Self-pay | Admitting: Oncology

## 2024-04-07 DIAGNOSIS — D631 Anemia in chronic kidney disease: Secondary | ICD-10-CM

## 2024-04-08 ENCOUNTER — Inpatient Hospital Stay: Attending: Oncology

## 2024-04-08 ENCOUNTER — Inpatient Hospital Stay: Admitting: Oncology

## 2024-04-08 VITALS — BP 184/78 | HR 71 | Temp 97.3°F | Resp 18 | Wt 131.6 lb

## 2024-04-08 DIAGNOSIS — I129 Hypertensive chronic kidney disease with stage 1 through stage 4 chronic kidney disease, or unspecified chronic kidney disease: Secondary | ICD-10-CM | POA: Insufficient documentation

## 2024-04-08 DIAGNOSIS — E876 Hypokalemia: Secondary | ICD-10-CM | POA: Insufficient documentation

## 2024-04-08 DIAGNOSIS — D631 Anemia in chronic kidney disease: Secondary | ICD-10-CM | POA: Diagnosis not present

## 2024-04-08 DIAGNOSIS — E871 Hypo-osmolality and hyponatremia: Secondary | ICD-10-CM | POA: Diagnosis not present

## 2024-04-08 DIAGNOSIS — E878 Other disorders of electrolyte and fluid balance, not elsewhere classified: Secondary | ICD-10-CM | POA: Diagnosis not present

## 2024-04-08 DIAGNOSIS — N184 Chronic kidney disease, stage 4 (severe): Secondary | ICD-10-CM

## 2024-04-08 DIAGNOSIS — N1832 Chronic kidney disease, stage 3b: Secondary | ICD-10-CM | POA: Diagnosis not present

## 2024-04-08 DIAGNOSIS — K921 Melena: Secondary | ICD-10-CM | POA: Insufficient documentation

## 2024-04-08 DIAGNOSIS — R7689 Other specified abnormal immunological findings in serum: Secondary | ICD-10-CM | POA: Insufficient documentation

## 2024-04-08 LAB — CBC WITH DIFFERENTIAL (CANCER CENTER ONLY)
Abs Immature Granulocytes: 0.03 K/uL (ref 0.00–0.07)
Basophils Absolute: 0 K/uL (ref 0.0–0.1)
Basophils Relative: 1 %
Eosinophils Absolute: 0.3 K/uL (ref 0.0–0.5)
Eosinophils Relative: 4 %
HCT: 22.6 % — ABNORMAL LOW (ref 36.0–46.0)
Hemoglobin: 8 g/dL — ABNORMAL LOW (ref 12.0–15.0)
Immature Granulocytes: 1 %
Lymphocytes Relative: 20 %
Lymphs Abs: 1.3 K/uL (ref 0.7–4.0)
MCH: 33.1 pg (ref 26.0–34.0)
MCHC: 35.4 g/dL (ref 30.0–36.0)
MCV: 93.4 fL (ref 80.0–100.0)
Monocytes Absolute: 0.5 K/uL (ref 0.1–1.0)
Monocytes Relative: 8 %
Neutro Abs: 4.4 K/uL (ref 1.7–7.7)
Neutrophils Relative %: 66 %
Platelet Count: 190 K/uL (ref 150–400)
RBC: 2.42 MIL/uL — ABNORMAL LOW (ref 3.87–5.11)
RDW: 12.2 % (ref 11.5–15.5)
WBC Count: 6.6 K/uL (ref 4.0–10.5)
nRBC: 0 % (ref 0.0–0.2)

## 2024-04-08 LAB — CMP (CANCER CENTER ONLY)
ALT: 13 U/L (ref 0–44)
AST: 19 U/L (ref 15–41)
Albumin: 4.2 g/dL (ref 3.5–5.0)
Alkaline Phosphatase: 71 U/L (ref 38–126)
Anion gap: 13 (ref 5–15)
BUN: 33 mg/dL — ABNORMAL HIGH (ref 8–23)
CO2: 22 mmol/L (ref 22–32)
Calcium: 10.7 mg/dL — ABNORMAL HIGH (ref 8.9–10.3)
Chloride: 94 mmol/L — ABNORMAL LOW (ref 98–111)
Creatinine: 3.12 mg/dL — ABNORMAL HIGH (ref 0.44–1.00)
GFR, Estimated: 14 mL/min — ABNORMAL LOW
Glucose, Bld: 116 mg/dL — ABNORMAL HIGH (ref 70–99)
Potassium: 5.6 mmol/L — ABNORMAL HIGH (ref 3.5–5.1)
Sodium: 129 mmol/L — ABNORMAL LOW (ref 135–145)
Total Bilirubin: 0.3 mg/dL (ref 0.0–1.2)
Total Protein: 7.4 g/dL (ref 6.5–8.1)

## 2024-04-08 LAB — IRON AND IRON BINDING CAPACITY (CC-WL,HP ONLY)
Iron: 83 ug/dL (ref 28–170)
Saturation Ratios: 40 % — ABNORMAL HIGH (ref 10.4–31.8)
TIBC: 206 ug/dL — ABNORMAL LOW (ref 250–450)
UIBC: 123 ug/dL

## 2024-04-08 LAB — FERRITIN: Ferritin: 722 ng/mL — ABNORMAL HIGH (ref 11–307)

## 2024-04-08 NOTE — Progress Notes (Signed)
 "  [facility]  HEMATOLOGY CLINIC PROGRESS NOTE  PATIENT NAME: Danielle Houston   MR#: [id number] DOB: [birth year]  Patient Care Team: Delbert Clam, MD as PCP - General (Family Medicine) Feliciano Devoria LABOR, MD as Referring Physician (Family Medicine) Zuleta, Kaitlin G, NP as Nurse Practitioner (Obstetrics and Gynecology)  Date of visit: 04/08/2024   ASSESSMENT & PLAN:   [name] is a 84 y.o. asian lady with a past medical history of hypertension, hypothyroidism, CKD, osteoporosis, CAD, dyslipidemia, gout, was referred to our service in December 2024 for evaluation of anemia.  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  Anemia in chronic kidney disease (CKD) Chronic anemia since 2023, with hemoglobin consistently between 7 and 8. No obvious blood loss reported. Patient has chronic kidney disease which could contribute to anemia. Iron labs and hemoglobin electrophoresis from primary care physician were normal.  On her initial consultation with us  on 03/12/2023, labs revealed hemoglobin of 8.3, hematocrit 25.5, MCV 97.7.  White count, platelet count were normal.  Creatinine 2.28, BUN 48.  Otherwise unremarkable CMP.  Vitamin B12, folate, methylmalonic acid, haptoglobin, LDH were all within normal limits. Coombs test negative.  SPEP showed no evidence of M spike.  IFE showed polyclonal increase in 1 or more immunoglobulins.  Quantitative IgG was slightly increased at 1966 mg/dL, IgA and IgM were within normal limits.  Serum free kappa was 96.2 mg/L, free lambda was 7.4 mg/L, both elevated with normal ratio of 1.35, consistent with CKD picture.  Labs today showed stable hemoglobin of 8.0.  White count and platelet count are within normal limits.  Creatinine 3.12, slightly worse compared to prior.  She does have hyponatremia, hypochloremia and mild hypokalemia, all likely related to her CKD.  We gave instructions to her nursing home to take her to nephrology  clinic for further evaluation regarding this electrolyte disturbances.  Currently no clinical concerns for acute issues.  Dark stools likely due to iron supplementation. - Continue current management.  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  If hemoglobin remains consistently below 9, we could consider monthly EPO simulating agents like Retacrit or Aranesp in an attempt to improve her hemoglobin.  -I will plan to evaluate her in 3 months with repeat labs and determine further course of action.  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  We give instructions to her nursing home to schedule nephrology clinic follow-up ASAP for close surveillance and any medication adjustments.    I spent a total of 23 minutes during this encounter with the patient including review of chart and various tests results, discussions about plan of care and coordination of care plan.  I reviewed lab results and outside records for this visit and discussed relevant results with the patient. Diagnosis, plan of care and treatment options were also discussed in detail with the patient. Opportunity provided to ask questions and answers provided to her apparent satisfaction. Provided instructions to call our clinic with any problems, questions or concerns prior to return visit. I recommended to continue follow-up with PCP and sub-specialists. She verbalized understanding and agreed with the plan. No barriers to learning was detected.  Chinita Patten, MD  04/08/2024 12:25 PM   [facility] - A DEPT OF [place] [address] [place]  [address] Dept: [phone] Dept Fax: [phone]   CHIEF COMPLAINT/ REASON FOR VISIT:  Chronic normocytic anemia, likely anemia of  chronic disease, related to CKD  INTERVAL HISTORY:  Discussed the use of AI scribe software for clinical note transcription with the patient, who gave verbal consent to proceed.   She speaks a particular dialect of Mandarin and we had to use the help of the interpreter and also her family members to communicate with her today.   History of Present Illness  Danielle Houston is an 84 year old female with chronic kidney disease and anemia who presents for hematology/oncology follow-up.   She resides in a nursing home and was brought to the visit by transportation staff; her son was not present. At this visit, she does not report any acute symptoms or new complaints.  She is followed for anemia.  Recent laboratory studies demonstrate significant worsening of renal parameters. She has chronic kidney disease and has previously been managed by nephrology, though recent nephrology documentation was unavailable for review.    SUMMARY OF HEMATOLOGIC HISTORY:  On 02/25/2023, labs showed hemoglobin of 7.8.  Creatinine 2.1, BUN 45, otherwise unremarkable CMP.  Iron studies showed no evidence of iron deficiency.  Folate was normal at 24.  B12 normal at 430.  Previously labs on 01/26/2023 showed hemoglobin of 7.6, hematocrit 24.1, MCV 101.3.  White count and platelet count were within normal limits.  Hemoglobin electrophoresis showed normal phenotype.  On review of records, hemoglobin has been in the range of 7-8 from September 2024.  Given persistent anemia, referral was sent to us  for further evaluation.   She denies recent chest pain on exertion, shortness of breath on minimal exertion, pre-syncopal episodes, or palpitations. The patient denies any obvious blood loss, but reports experiencing dry mouth and finding blood in her mouth in the mornings.  Chronic anemia since 2023, with hemoglobin consistently between 7 and 8. No obvious blood loss reported. Patient has chronic kidney disease which could  contribute to anemia. Iron labs and hemoglobin electrophoresis from primary care physician were normal.   On her initial consultation with us  on 03/12/2023, labs revealed hemoglobin of 8.3, hematocrit 25.5, MCV 97.7.  White count, platelet count were normal.  Creatinine 2.28, BUN 48.  Otherwise unremarkable CMP.  Vitamin B12, folate, methylmalonic acid, haptoglobin, LDH were all within normal limits. Coombs test negative.  SPEP showed no evidence of M spike.  IFE showed polyclonal increase in 1 or more immunoglobulins.  Quantitative IgG was slightly increased at 1966 mg/dL, IgA and IgM were within normal limits.  Serum free kappa was 96.2 mg/L, free lambda was 7.4 mg/L, both elevated with normal ratio of 1.35, consistent with CKD picture.  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  If hemoglobin remains consistently below 9, we could consider monthly EPO simulating agents like Retacrit or Aranesp in an attempt to improve her hemoglobin.  I have reviewed the past medical history, past surgical history, social history and family history with the patient and they are unchanged from previous note.  ALLERGIES: She has no known allergies.  MEDICATIONS:  Current Outpatient Medications  Medication Sig Dispense Refill   allopurinol  (ZYLOPRIM ) 100 MG tablet Take 0.5 tablets (50 mg total) by mouth every other day. 15 tablet 6   aspirin  EC 81 MG tablet Take 1 tablet (81 mg total) by mouth daily. Swallow whole. 30 tablet 11   atorvastatin  (LIPITOR) 40 MG tablet TAKE 1 TABLET (40 MG  TOTAL) BY MOUTH DAILY. 90 tablet 1   carvedilol  (COREG ) 12.5 MG tablet TAKE 1 TABLET (12.5 MG TOTAL) BY MOUTH 2 (TWO) TIMES DAILY WITH A MEAL. 180 tablet 1   guaiFENesin-dextromethorphan (ROBITUSSIN DM) 100-10 MG/5ML syrup Take 10 mLs by mouth every 6 (six) hours as needed for cough.     hydrALAZINE  (APRESOLINE ) 100 MG tablet Take 1 tablet (100 mg total) by mouth 2 (two) times  daily. 90 tablet 6   isosorbide  mononitrate (IMDUR ) 120 MG 24 hr tablet Take 1 tablet (120 mg total) by mouth daily. 90 tablet 1   pantoprazole  (PROTONIX ) 40 MG tablet Take 1 tablet (40 mg total) by mouth 2 (two) times daily before a meal. 60 tablet 0   Vitamin D , Ergocalciferol , (DRISDOL ) 1.25 MG (50000 UNIT) CAPS capsule Take 1 capsule (50,000 Units total) by mouth every 7 (seven) days. 5 capsule 0   No current facility-administered medications for this visit.     REVIEW OF SYSTEMS:    Review of Systems  Gastrointestinal:  Positive for abdominal pain and constipation.  Neurological:  Positive for dizziness and tingling.    All other pertinent systems were reviewed with the patient and are negative.  PHYSICAL EXAMINATION:    Onc Performance Status - 04/09/24 1700       ECOG Perf Status   ECOG Perf Status Ambulatory and capable of all selfcare but unable to carry out any work activities.  Up and about more than 50% of waking hours      KPS SCALE   KPS % SCORE Cares for self, unable to carry on normal activity or to do active work           Vitals:   04/08/24 1215  BP: (!) 184/78  Pulse: 71  Resp: 18  Temp: (!) 97.3 F (36.3 C)  SpO2: 98%    Filed Weights   04/08/24 1215  Weight: 131 lb 9 oz (59.7 kg)     Physical Exam Constitutional:      General: She is not in acute distress.    Appearance: Normal appearance.  HENT:     Head: Normocephalic and atraumatic.  Eyes:     General: No scleral icterus.    Conjunctiva/sclera: Conjunctivae normal.  Cardiovascular:     Rate and Rhythm: Normal rate and regular rhythm.     Heart sounds: Normal heart sounds.  Pulmonary:     Effort: Pulmonary effort is normal.     Breath sounds: Normal breath sounds.  Abdominal:     General: There is no distension.  Musculoskeletal:     Right lower leg: No edema.     Left lower leg: No edema.  Neurological:     General: No focal deficit present.     Mental Status: She is  alert.    LABORATORY DATA:   I have reviewed the data as listed.  Results for orders placed or performed in visit on 04/08/24  Iron and Iron Binding Capacity ([facility] only)  Result Value Ref Range   Iron 83 28 - 170 ug/dL   TIBC 793 (L) 749 - 549 ug/dL   Saturation Ratios 40 (H) 10.4 - 31.8 %   UIBC 123 ug/dL  Ferritin  Result Value Ref Range   Ferritin 722 (H) 11 - 307 ng/mL  CMP ([facility] only)  Result Value Ref Range   Sodium 129 (L) 135 - 145 mmol/L   Potassium 5.6 (H) 3.5 - 5.1 mmol/L   Chloride 94 (L) 98 -  111 mmol/L   CO2 22 22 - 32 mmol/L   Glucose, Bld 116 (H) 70 - 99 mg/dL   BUN 33 (H) 8 - 23 mg/dL   Creatinine 6.87 (H) 9.55 - 1.00 mg/dL   Calcium  10.7 (H) 8.9 - 10.3 mg/dL   Total Protein 7.4 6.5 - 8.1 g/dL   Albumin 4.2 3.5 - 5.0 g/dL   AST 19 15 - 41 U/L   ALT 13 0 - 44 U/L   Alkaline Phosphatase 71 38 - 126 U/L   Total Bilirubin 0.3 0.0 - 1.2 mg/dL   GFR, Estimated 14 (L) >60 mL/min   Anion gap 13 5 - 15  CBC with Differential ([facility] Only)  Result Value Ref Range   WBC Count 6.6 4.0 - 10.5 K/uL   RBC 2.42 (L) 3.87 - 5.11 MIL/uL   Hemoglobin 8.0 (L) 12.0 - 15.0 g/dL   HCT 77.3 (L) 63.9 - 53.9 %   MCV 93.4 80.0 - 100.0 fL   MCH 33.1 26.0 - 34.0 pg   MCHC 35.4 30.0 - 36.0 g/dL   RDW 87.7 88.4 - 84.4 %   Platelet Count 190 150 - 400 K/uL   nRBC 0.0 0.0 - 0.2 %   Neutrophils Relative % 66 %   Neutro Abs 4.4 1.7 - 7.7 K/uL   Lymphocytes Relative 20 %   Lymphs Abs 1.3 0.7 - 4.0 K/uL   Monocytes Relative 8 %   Monocytes Absolute 0.5 0.1 - 1.0 K/uL   Eosinophils Relative 4 %   Eosinophils Absolute 0.3 0.0 - 0.5 K/uL   Basophils Relative 1 %   Basophils Absolute 0.0 0.0 - 0.1 K/uL   Immature Granulocytes 1 %   Abs Immature Granulocytes 0.03 0.00 - 0.07 K/uL    RADIOGRAPHIC STUDIES:  No recent pertinent imaging studies available to review.  Orders Placed This Encounter  Procedures   CBC with Differential ([facility] Only)     Standing Status:   Future    Expiration Date:   04/09/2025   CMP ([facility] only)    Standing Status:   Future    Expiration Date:   04/09/2025   Ferritin    Standing Status:   Future    Expiration Date:   04/09/2025   Iron and Iron Binding Capacity ([facility] only)    Standing Status:   Future    Expiration Date:   04/09/2025     Future Appointments  Date Time Provider Department Center  07/08/2024  9:00 AM [facility] None  07/08/2024  9:30 AM Lashay Osborne, Chinita, MD [facility] None  07/19/2024  8:40 AM Marilynne Rosaline SAILOR, MD [facility]      This document was completed utilizing speech recognition software. Grammatical errors, random word insertions, pronoun errors, and incomplete sentences are an occasional consequence of this system due to software limitations, ambient noise, and hardware issues. Any formal questions or concerns about the content, text or information contained within the body of this dictation should be directly addressed to the provider for clarification.  "

## 2024-04-09 ENCOUNTER — Encounter: Payer: Self-pay | Admitting: Oncology

## 2024-04-09 NOTE — Assessment & Plan Note (Signed)
 Fluctuating renal function, currently slightly worse compared to prior with electrolyte disturbances.  We give instructions to her nursing home to schedule nephrology clinic follow-up ASAP for close surveillance and any medication adjustments.

## 2024-04-09 NOTE — Assessment & Plan Note (Signed)
 Chronic anemia since 2023, with hemoglobin consistently between 7 and 8. No obvious blood loss reported. Patient has chronic kidney disease which could contribute to anemia. Iron labs and hemoglobin electrophoresis from primary care physician were normal.  On her initial consultation with us  on 03/12/2023, labs revealed hemoglobin of 8.3, hematocrit 25.5, MCV 97.7.  White count, platelet count were normal.  Creatinine 2.28, BUN 48.  Otherwise unremarkable CMP.  Vitamin B12, folate, methylmalonic acid, haptoglobin, LDH were all within normal limits. Coombs test negative.  SPEP showed no evidence of M spike.  IFE showed polyclonal increase in 1 or more immunoglobulins.  Quantitative IgG was slightly increased at 1966 mg/dL, IgA and IgM were within normal limits.  Serum free kappa was 96.2 mg/L, free lambda was 7.4 mg/L, both elevated with normal ratio of 1.35, consistent with CKD picture.  Labs today showed stable hemoglobin of 8.0.  White count and platelet count are within normal limits.  Creatinine 3.12, slightly worse compared to prior.  She does have hyponatremia, hypochloremia and mild hypokalemia, all likely related to her CKD.  We gave instructions to her nursing home to take her to nephrology clinic for further evaluation regarding this electrolyte disturbances.  Currently no clinical concerns for acute issues.  Dark stools likely due to iron supplementation. - Continue current management.  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  If hemoglobin remains consistently below 9, we could consider monthly EPO simulating agents like Retacrit or Aranesp in an attempt to improve her hemoglobin.  -I will plan to evaluate her in 3 months with repeat labs and determine further course of action.

## 2024-07-08 ENCOUNTER — Inpatient Hospital Stay

## 2024-07-08 ENCOUNTER — Inpatient Hospital Stay: Admitting: Oncology

## 2024-07-19 ENCOUNTER — Ambulatory Visit: Admitting: Obstetrics and Gynecology
